# Patient Record
Sex: Female | Born: 1945
Health system: Southern US, Community
[De-identification: ages and names within clinical notes are randomized; demographics above are authoritative.]

## PROBLEM LIST (undated history)

## (undated) ENCOUNTER — Inpatient Hospital Stay (HOSPITAL_COMMUNITY): Payer: Self-pay

## (undated) ENCOUNTER — Emergency Department (HOSPITAL_COMMUNITY): Admission: EM | Discharge status: Absent without leave | Payer: Commercial Managed Care - HMO

## (undated) DIAGNOSIS — K5730 Diverticulosis of large intestine without perforation or abscess without bleeding: Secondary | ICD-10-CM

## (undated) DIAGNOSIS — I493 Ventricular premature depolarization: Secondary | ICD-10-CM

## (undated) DIAGNOSIS — I10 Essential (primary) hypertension: Secondary | ICD-10-CM

## (undated) DIAGNOSIS — T4145XA Adverse effect of unspecified anesthetic, initial encounter: Secondary | ICD-10-CM

## (undated) DIAGNOSIS — E669 Obesity, unspecified: Secondary | ICD-10-CM

## (undated) DIAGNOSIS — J189 Pneumonia, unspecified organism: Secondary | ICD-10-CM

## (undated) DIAGNOSIS — I351 Nonrheumatic aortic (valve) insufficiency: Secondary | ICD-10-CM

## (undated) DIAGNOSIS — K219 Gastro-esophageal reflux disease without esophagitis: Secondary | ICD-10-CM

## (undated) DIAGNOSIS — I491 Atrial premature depolarization: Secondary | ICD-10-CM

## (undated) DIAGNOSIS — J45909 Unspecified asthma, uncomplicated: Secondary | ICD-10-CM

## (undated) DIAGNOSIS — Z8719 Personal history of other diseases of the digestive system: Secondary | ICD-10-CM

## (undated) DIAGNOSIS — M1990 Unspecified osteoarthritis, unspecified site: Secondary | ICD-10-CM

## (undated) DIAGNOSIS — N390 Urinary tract infection, site not specified: Secondary | ICD-10-CM

## (undated) DIAGNOSIS — I517 Cardiomegaly: Secondary | ICD-10-CM

## (undated) DIAGNOSIS — I499 Cardiac arrhythmia, unspecified: Secondary | ICD-10-CM

## (undated) DIAGNOSIS — E7800 Pure hypercholesterolemia, unspecified: Secondary | ICD-10-CM

## (undated) DIAGNOSIS — I639 Cerebral infarction, unspecified: Secondary | ICD-10-CM

## (undated) DIAGNOSIS — E119 Type 2 diabetes mellitus without complications: Secondary | ICD-10-CM

## (undated) DIAGNOSIS — J42 Unspecified chronic bronchitis: Secondary | ICD-10-CM

## (undated) DIAGNOSIS — R569 Unspecified convulsions: Secondary | ICD-10-CM

## (undated) HISTORY — DX: Obesity, unspecified: E66.9

## (undated) HISTORY — DX: Nonrheumatic aortic (valve) insufficiency: I35.1

## (undated) HISTORY — PX: TONSILLECTOMY AND ADENOIDECTOMY: SUR1326

## (undated) HISTORY — PX: BLADDER SUSPENSION: SHX72

## (undated) HISTORY — PX: ESOPHAGEAL DILATION: SHX303

## (undated) HISTORY — PX: COLECTOMY: SHX59

## (undated) HISTORY — PX: BIOPSY THYROID: PRO38

## (undated) HISTORY — PX: CARDIAC CATHETERIZATION: SHX172

## (undated) HISTORY — DX: Unspecified osteoarthritis, unspecified site: M19.90

## (undated) HISTORY — PX: HERNIA REPAIR: SHX51

## (undated) HISTORY — DX: Ventricular premature depolarization: I49.3

## (undated) HISTORY — DX: Pure hypercholesterolemia, unspecified: E78.00

## (undated) HISTORY — PX: CATARACT EXTRACTION W/ INTRAOCULAR LENS  IMPLANT, BILATERAL: SHX1307

## (undated) HISTORY — PX: APPENDECTOMY: SHX54

## (undated) HISTORY — PX: SHOULDER ARTHROSCOPY W/ ROTATOR CUFF REPAIR: SHX2400

## (undated) HISTORY — DX: Cardiomegaly: I51.7

## (undated) HISTORY — PX: COLOSTOMY: SHX63

## (undated) HISTORY — DX: Essential (primary) hypertension: I10

## (undated) HISTORY — DX: Atrial premature depolarization: I49.1

## (undated) HISTORY — PX: COLOSTOMY REVERSAL: SHX5782

## (undated) HISTORY — PX: CARPAL TUNNEL RELEASE: SHX101

## (undated) HISTORY — PX: ABDOMINAL HERNIA REPAIR: SHX539

---

## 1982-06-28 HISTORY — PX: CHOLECYSTECTOMY OPEN: SUR202

## 1997-06-28 DIAGNOSIS — T8859XA Other complications of anesthesia, initial encounter: Secondary | ICD-10-CM

## 1997-06-28 HISTORY — DX: Other complications of anesthesia, initial encounter: T88.59XA

## 1997-12-25 ENCOUNTER — Ambulatory Visit (HOSPITAL_COMMUNITY): Admission: RE | Admit: 1997-12-25 | Discharge: 1997-12-25 | Payer: Self-pay | Admitting: Surgery

## 1998-04-10 ENCOUNTER — Inpatient Hospital Stay (HOSPITAL_COMMUNITY): Admission: RE | Admit: 1998-04-10 | Discharge: 1998-04-21 | Payer: Self-pay | Admitting: Surgery

## 1998-04-20 ENCOUNTER — Encounter: Payer: Self-pay | Admitting: General Surgery

## 1998-05-29 ENCOUNTER — Ambulatory Visit (HOSPITAL_COMMUNITY): Admission: RE | Admit: 1998-05-29 | Discharge: 1998-05-29 | Payer: Self-pay | Admitting: Family Medicine

## 1998-08-26 ENCOUNTER — Inpatient Hospital Stay (HOSPITAL_COMMUNITY): Admission: EM | Admit: 1998-08-26 | Discharge: 1998-08-29 | Payer: Self-pay | Admitting: General Surgery

## 1998-08-27 ENCOUNTER — Encounter: Payer: Self-pay | Admitting: General Surgery

## 1999-04-14 ENCOUNTER — Encounter: Admission: RE | Admit: 1999-04-14 | Discharge: 1999-04-14 | Payer: Self-pay | Admitting: Family Medicine

## 1999-04-14 ENCOUNTER — Encounter: Payer: Self-pay | Admitting: Family Medicine

## 1999-05-08 ENCOUNTER — Encounter: Payer: Self-pay | Admitting: General Surgery

## 1999-05-08 ENCOUNTER — Encounter: Admission: RE | Admit: 1999-05-08 | Discharge: 1999-05-08 | Payer: Self-pay | Admitting: General Surgery

## 1999-06-02 ENCOUNTER — Encounter: Payer: Self-pay | Admitting: General Surgery

## 1999-06-04 ENCOUNTER — Inpatient Hospital Stay (HOSPITAL_COMMUNITY): Admission: RE | Admit: 1999-06-04 | Discharge: 1999-06-11 | Payer: Self-pay | Admitting: General Surgery

## 1999-06-06 ENCOUNTER — Encounter: Payer: Self-pay | Admitting: Surgery

## 1999-06-10 ENCOUNTER — Encounter: Payer: Self-pay | Admitting: General Surgery

## 1999-08-04 ENCOUNTER — Encounter: Admission: RE | Admit: 1999-08-04 | Discharge: 1999-08-04 | Payer: Self-pay | Admitting: General Surgery

## 1999-08-04 ENCOUNTER — Encounter: Payer: Self-pay | Admitting: General Surgery

## 1999-10-20 ENCOUNTER — Other Ambulatory Visit: Admission: RE | Admit: 1999-10-20 | Discharge: 1999-10-20 | Payer: Self-pay | Admitting: Geriatric Medicine

## 2000-02-17 ENCOUNTER — Other Ambulatory Visit: Admission: RE | Admit: 2000-02-17 | Discharge: 2000-02-17 | Payer: Self-pay | Admitting: Gastroenterology

## 2000-02-17 ENCOUNTER — Encounter (INDEPENDENT_AMBULATORY_CARE_PROVIDER_SITE_OTHER): Payer: Self-pay | Admitting: Specialist

## 2000-03-11 ENCOUNTER — Encounter: Admission: RE | Admit: 2000-03-11 | Discharge: 2000-03-11 | Payer: Self-pay | Admitting: General Surgery

## 2000-03-11 ENCOUNTER — Encounter: Payer: Self-pay | Admitting: General Surgery

## 2000-09-28 ENCOUNTER — Encounter: Payer: Self-pay | Admitting: Geriatric Medicine

## 2000-09-28 ENCOUNTER — Encounter: Admission: RE | Admit: 2000-09-28 | Discharge: 2000-09-28 | Payer: Self-pay | Admitting: Geriatric Medicine

## 2001-01-06 ENCOUNTER — Ambulatory Visit (HOSPITAL_COMMUNITY): Admission: RE | Admit: 2001-01-06 | Discharge: 2001-01-06 | Payer: Self-pay | Admitting: Gastroenterology

## 2001-02-14 ENCOUNTER — Encounter: Payer: Self-pay | Admitting: Geriatric Medicine

## 2001-02-14 ENCOUNTER — Encounter: Admission: RE | Admit: 2001-02-14 | Discharge: 2001-02-14 | Payer: Self-pay | Admitting: Geriatric Medicine

## 2001-04-25 ENCOUNTER — Ambulatory Visit (HOSPITAL_BASED_OUTPATIENT_CLINIC_OR_DEPARTMENT_OTHER): Admission: RE | Admit: 2001-04-25 | Discharge: 2001-04-25 | Payer: Self-pay | Admitting: Orthopedic Surgery

## 2001-07-19 ENCOUNTER — Encounter: Admission: RE | Admit: 2001-07-19 | Discharge: 2001-09-14 | Payer: Self-pay | Admitting: Geriatric Medicine

## 2001-11-08 ENCOUNTER — Other Ambulatory Visit: Admission: RE | Admit: 2001-11-08 | Discharge: 2001-11-08 | Payer: Self-pay | Admitting: Geriatric Medicine

## 2002-05-01 ENCOUNTER — Ambulatory Visit (HOSPITAL_BASED_OUTPATIENT_CLINIC_OR_DEPARTMENT_OTHER): Admission: RE | Admit: 2002-05-01 | Discharge: 2002-05-01 | Payer: Self-pay | Admitting: Orthopedic Surgery

## 2002-06-28 HISTORY — PX: TOTAL ABDOMINAL HYSTERECTOMY: SHX209

## 2003-01-18 ENCOUNTER — Encounter: Payer: Self-pay | Admitting: Geriatric Medicine

## 2003-01-18 ENCOUNTER — Encounter: Admission: RE | Admit: 2003-01-18 | Discharge: 2003-01-18 | Payer: Self-pay | Admitting: Geriatric Medicine

## 2003-02-01 ENCOUNTER — Other Ambulatory Visit: Admission: RE | Admit: 2003-02-01 | Discharge: 2003-02-01 | Payer: Self-pay | Admitting: Obstetrics and Gynecology

## 2003-02-21 ENCOUNTER — Encounter: Payer: Self-pay | Admitting: Obstetrics and Gynecology

## 2003-02-21 ENCOUNTER — Ambulatory Visit (HOSPITAL_COMMUNITY): Admission: RE | Admit: 2003-02-21 | Discharge: 2003-02-21 | Payer: Self-pay | Admitting: Obstetrics and Gynecology

## 2003-03-05 ENCOUNTER — Ambulatory Visit: Admission: RE | Admit: 2003-03-05 | Discharge: 2003-03-05 | Payer: Self-pay | Admitting: Gynecology

## 2003-04-12 ENCOUNTER — Encounter: Payer: Self-pay | Admitting: Gynecology

## 2003-04-16 ENCOUNTER — Encounter (INDEPENDENT_AMBULATORY_CARE_PROVIDER_SITE_OTHER): Payer: Self-pay

## 2003-04-16 ENCOUNTER — Encounter: Payer: Self-pay | Admitting: Anesthesiology

## 2003-04-16 ENCOUNTER — Inpatient Hospital Stay (HOSPITAL_COMMUNITY): Admission: RE | Admit: 2003-04-16 | Discharge: 2003-04-21 | Payer: Self-pay | Admitting: Gynecology

## 2003-04-18 ENCOUNTER — Encounter: Payer: Self-pay | Admitting: Obstetrics and Gynecology

## 2003-05-22 ENCOUNTER — Inpatient Hospital Stay (HOSPITAL_COMMUNITY): Admission: AD | Admit: 2003-05-22 | Discharge: 2003-05-22 | Payer: Self-pay | Admitting: Obstetrics and Gynecology

## 2003-06-01 ENCOUNTER — Emergency Department (HOSPITAL_COMMUNITY): Admission: EM | Admit: 2003-06-01 | Discharge: 2003-06-02 | Payer: Self-pay | Admitting: Emergency Medicine

## 2003-09-24 ENCOUNTER — Encounter: Admission: RE | Admit: 2003-09-24 | Discharge: 2003-09-24 | Payer: Self-pay | Admitting: Geriatric Medicine

## 2005-03-03 ENCOUNTER — Encounter: Admission: RE | Admit: 2005-03-03 | Discharge: 2005-03-03 | Payer: Self-pay | Admitting: Geriatric Medicine

## 2005-05-26 ENCOUNTER — Encounter: Admission: RE | Admit: 2005-05-26 | Discharge: 2005-05-26 | Payer: Self-pay | Admitting: Orthopedic Surgery

## 2005-06-30 ENCOUNTER — Ambulatory Visit: Payer: Self-pay

## 2005-10-27 ENCOUNTER — Encounter: Payer: Self-pay | Admitting: Surgery

## 2006-07-27 ENCOUNTER — Encounter: Admission: RE | Admit: 2006-07-27 | Discharge: 2006-07-27 | Payer: Self-pay | Admitting: Geriatric Medicine

## 2006-12-02 ENCOUNTER — Emergency Department (HOSPITAL_COMMUNITY): Admission: EM | Admit: 2006-12-02 | Discharge: 2006-12-02 | Payer: Self-pay | Admitting: Emergency Medicine

## 2006-12-03 ENCOUNTER — Ambulatory Visit (HOSPITAL_COMMUNITY): Admission: RE | Admit: 2006-12-03 | Discharge: 2006-12-03 | Payer: Self-pay | Admitting: Emergency Medicine

## 2006-12-03 ENCOUNTER — Ambulatory Visit: Payer: Self-pay | Admitting: Vascular Surgery

## 2007-04-26 ENCOUNTER — Ambulatory Visit (HOSPITAL_BASED_OUTPATIENT_CLINIC_OR_DEPARTMENT_OTHER): Admission: RE | Admit: 2007-04-26 | Discharge: 2007-04-27 | Payer: Self-pay | Admitting: Urology

## 2007-05-06 ENCOUNTER — Ambulatory Visit: Payer: Self-pay | Admitting: Internal Medicine

## 2007-05-06 ENCOUNTER — Inpatient Hospital Stay (HOSPITAL_COMMUNITY): Admission: EM | Admit: 2007-05-06 | Discharge: 2007-05-08 | Payer: Self-pay | Admitting: Emergency Medicine

## 2007-05-08 ENCOUNTER — Encounter: Payer: Self-pay | Admitting: Interventional Cardiology

## 2008-08-18 ENCOUNTER — Encounter: Admission: RE | Admit: 2008-08-18 | Discharge: 2008-08-18 | Payer: Self-pay | Admitting: Geriatric Medicine

## 2008-09-10 ENCOUNTER — Encounter: Admission: RE | Admit: 2008-09-10 | Discharge: 2008-10-10 | Payer: Self-pay | Admitting: Geriatric Medicine

## 2009-10-26 HISTORY — PX: TRIGGER FINGER RELEASE: SHX641

## 2009-10-29 ENCOUNTER — Ambulatory Visit (HOSPITAL_BASED_OUTPATIENT_CLINIC_OR_DEPARTMENT_OTHER): Admission: RE | Admit: 2009-10-29 | Discharge: 2009-10-29 | Payer: Self-pay | Admitting: Orthopedic Surgery

## 2010-02-03 ENCOUNTER — Inpatient Hospital Stay (HOSPITAL_BASED_OUTPATIENT_CLINIC_OR_DEPARTMENT_OTHER): Admission: RE | Admit: 2010-02-03 | Discharge: 2010-02-03 | Payer: Self-pay | Admitting: Interventional Cardiology

## 2010-08-28 ENCOUNTER — Other Ambulatory Visit: Payer: Self-pay

## 2010-09-15 LAB — BASIC METABOLIC PANEL
BUN: 16 mg/dL (ref 6–23)
CO2: 32 mEq/L (ref 19–32)
Calcium: 9 mg/dL (ref 8.4–10.5)
Chloride: 103 mEq/L (ref 96–112)
Creatinine, Ser: 0.61 mg/dL (ref 0.4–1.2)
GFR calc Af Amer: >60 mL/min (ref >60)
GFR calc non Af Amer: >60 mL/min (ref >60)
Glucose, Bld: 118 mg/dL — ABNORMAL HIGH (ref 70–99)
Potassium: 4.8 mEq/L (ref 3.5–5.1)
Sodium: 139 mEq/L (ref 135–145)

## 2010-09-15 LAB — POCT HEMOGLOBIN-HEMACUE: Hemoglobin: 10.3 g/dL — ABNORMAL LOW (ref 12.0–15.0)

## 2010-09-15 LAB — GLUCOSE, CAPILLARY
Glucose-Capillary: 125 mg/dL — ABNORMAL HIGH (ref 70–99)
Glucose-Capillary: 132 mg/dL — ABNORMAL HIGH (ref 70–99)

## 2010-09-28 ENCOUNTER — Other Ambulatory Visit: Payer: Self-pay

## 2010-11-10 NOTE — Op Note (Signed)
NAME:  Kathleen Oliver, Kathleen Oliver NO.:  [id number]   MEDICAL RECORD NO.:  [id number]          PATIENT TYPE:  AMB   LOCATION:  [facility]                         FACILITY:  [facility]   PHYSICIAN:  Valetta Fuller, M.D.  DATE OF BIRTH:  04/23/1946   DATE OF PROCEDURE:  04/26/2007  DATE OF DISCHARGE:                               OPERATIVE REPORT   PREOPERATIVE DIAGNOSIS:  Stress urinary incontinence.   POSTOPERATIVE DIAGNOSIS:  Stress urinary incontinence.   PROCEDURE PERFORMED:  Transobturator suburethral sling.   SURGEON:  Valetta Fuller, M.D.   ANESTHESIA:  General.   INDICATIONS:  [name] is a 65 year old female.  She has had very  longstanding problems with urinary incontinence and voiding dysfunction.  She has had clear-cut stress incontinence by history with confirmation  on video urodynamics.  She also had some voiding dysfunction with a  reduced bladder capacity, hypersensitivity and at least some mild  instability.  She has been on anticholinergic medication as well as  physical therapy to try to improve her irritative/overactive symptoms.  She has persisted in having significant stress incontinence and elected  to have suburethral sling.  She had recent ventral hernia repair with  mesh and therefore we want to avoid the retropubic approach if possible  and I suggested transobturator.  She appeared to understand the  advantages, disadvantages, success rates, potential complications and  issues with regards to suburethral sling surgery.  She elected to  proceed and presents now for the procedure.   PROCEDURE IN DETAIL:  The patient was brought to the operating room  where she had successful induction of general anesthesia.  She was  placed in the mid lithotomy position and prepped and draped in the usual  manner.  A weighted vaginal speculum was utilized.  The patient had  moderate vaginal stenosis and moderate to significant vaginal atrophy.  There did not appear to  be significant prolapse at least anteriorly.  Foley catheter was inserted and the bladder completely drained.  Anterior vaginal mucosa over the mid urethra was infiltrated.  A small  midline incision was made.  We then were able to dissect off the vaginal  planes until we were able to at least palpate the obturator fossa region  bilaterally.   Two stab incisions were made approximately 5 cm lateral to the clitoris  on the right and left side.  The curved obturator needles were then used  with direct digital finger control to come out the area of the mid  urethra.  The sling itself was then positioned at the mid urethra  utilizing a right angle clamp.  The sheath was removed in the standard  manner and the right angle clamp was used to assure proper tensioning.  Redundant sling was then cut at the level of the obturator incisions.  The whole vaginal area was copiously irrigated.  Cystoscopy was  performed.  There was no evidence of any other bladder pathology,  evidence of any urethral or bladder injury.  Vaginal mucosa was closed  with 2-0 Vicryl suture.  Vaginal packing with Estrace cream was utilized  and a Foley catheter was left indwelling draining clear urine.  There  were no obvious complications or problems and the patient was brought to  the recovery room in stable condition.           ______________________________  Valetta Fuller, M.D.  Electronically Signed     DSG/MEDQ  D:  04/26/2007  T:  04/26/2007  Job:  [id number]

## 2010-11-10 NOTE — H&P (Signed)
NAME:  Kathleen Oliver NO.:  [id number]   MEDICAL RECORD NO.:  [id number]          PATIENT TYPE:  INP   LOCATION:  1419                         FACILITY:  [facility]   PHYSICIAN:  Michelene Gardener, MD    DATE OF BIRTH:  06-22-1946   DATE OF ADMISSION:  05/06/2007  DATE OF DISCHARGE:                              HISTORY & PHYSICAL   CHIEF COMPLAINT:  Chest pain for several hours today status post bladder  sling October 29th per Dr. Isabel Caprice.   HISTORY OF PRESENT ILLNESS:  Kathleen Oliver is a 65 year old white female here  with chest discomfort anterior to left chest that seemed to start about  2:30 p.m., off and on for 2 to 3 hours, just would not go away even  though she tried to burp.  It was dull with nausea, sweating, radiated  to the left arm and she became concerned, called the daughter and came  to the ER.  Sublingual nitroglycerin seemed to help in the ER and she is  now pain-free.   PAST MEDICAL HISTORY:  Illnesses:  1. History of TIA with seizure.  2. GERD.  3. Diabetes mellitus.  4. Diverticulosis.  5. Depression.  6. Peripheral neuropathy of left lateral thigh post one of her      abdominal surgeries.  7. Hypertension.  8. Glaucoma.   SURGERIES:  1. Status post bladder sling October, 2008.  2. Carpal tunnel syndrome.  3. Cholecystectomy.  4. Status post multiple laparotomies with anterior abdominal wall      mesh.  5. TAH/BSO.  6. Temporary colostomy in 1999 after ruptured diverticulosis and      partial colon resection.  7. Shoulder surgery.   FAMILY HISTORY:  1. Coronary artery disease.  2. Diabetes.   SOCIAL HISTORY:  1. No tobacco.  2. No alcohol.   DRUG ALLERGIES:  1. PENICILLIN.  2. CINOBAC.   MEDICATIONS:  1. Macrodantin 100 mg p.o. b.i.d. for 10 days.  2. Vicodin 1 to 2 q.6 hours p.r.n.  3. Aggrenox one p.o. b.i.d.  4. HCTZ 12.5 mg p.o. daily.  5. Lisinopril 10 mg p.o. daily.  6. Norvasc 2.5 mg p.o. daily.  7. Lipitor 10 mg p.o.  daily.  8. Metformin 500 mg p.o. daily.  9. Sanctura 60 mg one p.o. daily.   REVIEW OF SYSTEMS:  Noncontributory.   PHYSICAL EXAM:  She is afebrile, heart rate 72, respirations 20, blood  pressure 145/67, O2 saturation 98%.  ENT EXAM:  Sclera clear.  TMs clear.  Oropharynx benign.  NECK:  Without lymphadenopathy, JVD, thyromegaly.  CHEST:  No rales or wheezing.  CARDIAC EXAM:  Regular rate and rhythm.  No murmur, gallop or rub.  ABDOMEN:  Soft, mild low abdominal tenderness.  Positive bowel sounds.  EXTREMITIES:  Chronic right lower extremity trace edema.  SKIN:  No rash.  NEUROLOGICAL:  Nonfocal.   LABS:  CPK MB less than 1 and troponin I of less than 0.05 x2.  Hemoglobin 11.7, white blood cell count 9.6, D-dimer is slightly  elevated at 0.55 with the upper limits of normal  at 0.48.  Electrolytes  within normal limits.  Glucose 135, BUN and creatinine 12 and 0.8.  LFTs  within normal limits.  ECG:  Sinus rhythm, no acute changes.  Chest x-  ray:  No acute disease.   ASSESSMENT AND PLAN:  Chest pain, questionable cardiac versus  gastroenterologic versus pulmonary embolism versus other.  She is to be  admitted.  Plan of action is to apply telemetry,  rule out myocardial  infarction  with serial cardiac enzymes.  Check CT angiogram of the chest to rule  out pulmonary embolism.  Given her recent surgery and history of  transient ischemic attack, currently on Aggrenox, consider Cardiology  consultation prior to discharge if CT is negative.      Corwin Levins, MD      Michelene Gardener, MD  Electronically Signed    JWJ/MEDQ  D:  05/06/2007  T:  05/07/2007  Job:  [id number]   cc:   Hal T. Stoneking, M.D.  Fax: [phone]   Corky Crafts, MD  Fax: [phone]   Valetta Fuller, M.D.  Fax: [phone]

## 2010-11-13 NOTE — Discharge Summary (Signed)
NAME:  Kathleen Oliver, Kathleen Oliver NO.:  [id number]   MEDICAL RECORD NO.:  [id number]                   PATIENT TYPE:  INP   LOCATION:  0443                                 FACILITY:  [facility]   PHYSICIAN:  Randye Lobo, M.D.                DATE OF BIRTH:  1946/04/25   DATE OF ADMISSION:  04/16/2003  DATE OF DISCHARGE:  04/21/2003                                 DISCHARGE SUMMARY   ADMISSION DIAGNOSES:  1. Postmenopausal bleeding.  2. Cervical stenosis.  3. Thickened endometrial stripe on ultrasound.  4. Status post multiple laparotomies with an anterior abdominal wall mesh     placement.   DISCHARGE DIAGNOSES:  1. Status post exploratory laparotomy with total abdominal hysterectomy,     bilateral salpingo-oophorectomy, and enterolysis.  2. Benign endometrial polyp.  3. Simple endometrial hyperplasia.   HISTORY AND PHYSICAL EXAMINATION:  The patient is a 65 year old gravida 1,  para 1, Caucasian female with postmenopausal bleeding of several months  duration who was status post attempted ultrasound guided D&C on February 21, 2003, which was unsuccessful due to cervical stenosis.  The patient  underwent premature menopause in her 30s, she did report a history of DES  exposure.  A pelvic ultrasound on January 18, 2003, documented a thickened  endometrial stripe of 11 mm.  No adnexal masses were appreciated on  ultrasound.   The patient's surgical history had been remarkable for several prior  laparotomies, including a colon resection with colostomy, a colostomy  revision, and ultimately a colostomy reversal, and then two ventral  herniorrhaphies, this last of which involved placement of an anterior  abdominal wall mesh at the end of the year 2000.   PHYSICAL EXAMINATION:  ABDOMEN:  A midline vertical incision with also a  right upper quadrant oblique incision.  There was no evidence of any  herniation present.  There were no abdominal masses.  PELVIC:  Normal  external genitalia and urethra.  The vagina demonstrated no  lesions.  The cervix was noted to be flush with the vagina, and there  appeared to be a dimple in the right vaginal apex which was thought to be  consistent with the cervical os.  The uterus was small and nontender, and no  adnexal masses were appreciated.   The patient underwent consultation by Dr. De Blanch based on the  failed ultrasound guided D&C procedure to sample the endometrium, and he did  recommend exploratory laparotomy based on suspicion for potential  endometrial neoplasia, and the patient was therefore admitted on April 16, 2003, at which time she underwent an exploratory laparotomy with total  abdominal hysterectomy, bilateral salpingo-oophorectomy, and enterolysis  with Drs. De Blanch and Dr. Conley Simmonds as co-surgeons.  Findings at the time of surgery were an anterior abdominal wall mesh which  was traversed in order to gain entry into the peritoneal cavity.  There were  multiple adhesions and small bowel to the anterior abdominal wall which were  lysed during the surgery.  The patient appeared to have a normal uterus,  tubes, and ovaries.  The specimen was sent to pathology, and the frozen  section report did document cervical stenosis as well as simple endometrial  hyperplasia and a benign endometrial polyp.  The patient's surgery was  uncomplicated.   Postoperatively, the patient had a slow return to bowel function.  Initially, her pain was controlled with Dilaudid PCA and Toradol.  Eventually, she had her diet slowly advanced.  She began demonstrating  return to bowel function, and she was converted to oral pain medication,  including Percocet and ibuprofen.   The patient was documented to have a fever to 102.1 degrees Fahrenheit on  April 18, 2003.  Her white count was measured at 8.1.  Her urinalysis was  negative.  A chest x-ray demonstrated bibasilar atelectasis with no  evidence  of an acute infiltrate or consolidation.  The patient increased her  ambulation and use of the incentive spirometer, and the fever defervesced.  Final blood culture results were pending at the time of her discharge, and  ultimately were negative for bacteremia.   The patient's discharge hematocrit was 28.7%, and she was tolerating this  well.   The patient's incisions remained clean, dry, and intact without evidence of  any erythema or drainage.   The patient was found to be in good condition and ready for discharge on  postoperative day #5.   DISCHARGE INSTRUCTIONS:  1. Discharged to home.  2. Prescriptions for Percocet one to two p.o. q.4h. p.r.n. pain.  The     patient will resume her other usual medications and her usual dosages.  3. The patient will resume a regular diet.  4. The patient will diminish her activity.  She will not drive for two     weeks.  She will not lift anything greater than 10 pounds for another six     weeks.  5. The patient will follow up in the office in three days for staple     removal.  She will call sooner if she experiences any problem with fever,     nausea, vomiting, redness or drainage around the incision, pain     uncontrolled by her medication, or any other concern.                                               Randye Lobo, M.D.    BES/MEDQ  D:  05/14/2003  T:  05/14/2003  Job:  [id number]

## 2010-11-13 NOTE — Consult Note (Signed)
NAME:  Kathleen Oliver, GUNNER NO.:  [id number]   MEDICAL RECORD NO.:  [id number]                   PATIENT TYPE:  OUT   LOCATION:  GYN                                  FACILITY:  [facility]   PHYSICIAN:  De Blanch, M.D.         DATE OF BIRTH:  04-09-1946   DATE OF CONSULTATION:  03/05/2003  DATE OF DISCHARGE:                                   CONSULTATION   GYN/ONCOLOGY PROGRAM/NEW PATIENT CONSULTATION   A 65 year old white female seen in consultation at the request of Dr. Conley Simmonds, regarding postmenopausal bleeding and cervical stenosis and a  thickened endometrial stripe.   Essentially, the patient noted some pelvic pressure and underwent evaluation  with an ultrasound which showed an 11 mm endometrial stripe.  The  endometrial biopsies were attempted but secondary to cervical stenosis were  unsuccessful.  Subsequently, the patient was taken to the operating room  where she underwent attempted D&C using ultrasound guidance.  Once again,  the endometrial cavity was unable to be entered.  The patient has had no  complications associated with this.   PAST MEDICAL HISTORY:   MEDICAL ILLNESSES:  1. The patient apparently has had a complicated series of surgical     procedures associated with ruptured diverticulitis.  During the course of     recovery from what sounds like sepsis, she had a seizure in 1999.  She     has had none since then.  2. Hypertension.  3. Glaucoma.  4. GERD.   PAST SURGICAL HISTORY:  1. In 1999, the patient had a ruptured colon, undergoing colostomy and     resection.  Colostomy was reversed subsequently, but according to the     patient's history, she had a leak and required a second operation.     Subsequent to that, she has had ventral hernias repaired on two     occasions, the last being in the year 2000, where a mesh was inserted.  2. Cholecystectomy in 1985.  3. Carpal tunnel release in 2002.  4. The patient  has had sigmoidoscopy approximately two weeks ago which was     negative.   DRUG ALLERGIES:  1. PENICILLIN.  2. CINOBAC (an antibiotic).   LAST MENSTRUAL PERIOD:  The patient went through menopause in her early 83s.  She does have one living child.   CURRENT MEDICATIONS:  Hydrochlorothiazide, Pepcid, Timoptic, and aspirin.   REVIEW OF SYSTEMS:  Essentially negative except for pelvic pressure.  She  has occasional vaginal spotting.   PHYSICAL EXAMINATION:  GENERAL:  The patient is a healthy, pleasant, obese  white female in no acute distress.  VITAL SIGNS:  Height 5 foot 2.  Weight 205 pounds.  Blood pressure 158/90,  pulse 76, respiratory rate 18.  HEENT:  Negative.  NECK:  Supple without thyromegaly.  There is no supraclavicular or inguinal  adenopathy.  ABDOMEN:  Obese, soft, nontender.  No  mass, organomegaly, ascites, or  hernias are noted.  Her midline incision is well-healed.  PELVIC:  EGBUS, vagina, bladder, and urethra are normal.  There is a dimple  at the right apex of what appears to be the vagina.  I am unable to sound  this with a wire probe, although I suspect it is some remnant of the cervix.  Bimanual exam reveals what I believe is a small uterus which is mobile, and  no adnexal masses noted.  Rectovaginal examination confirms.   IMPRESSION:  A thickened endometrial stripe in a patient with cervical  stenosis.  We are unable to obtain an endometrial sample.  I believe that  she is at high enough risk to have endometrial cancer, that further  pathologic evaluation is necessary rather than observation.  Therefore, I  would recommend that the patient undergo a total abdominal hysterectomy and  bilateral salpingo-oophorectomy with intraoperative frozen section and  surgical staging if necessary.  Obviously, the risks of surgery are  increased given the fact she has had peritonitis and two prior colon  procedures.  Obviously, we would anticipate extensive adhesions.   In  addition, the patient has mesh in her anterior abdominal wall which I  believe could be closed without difficulty, although we will contact Dr.  Abbey Chatters for his advice and consultation before planning surgery.  I would  be happy to operate with Dr. Edward Jolly, and we will arrange and coordinate  surgery thereafter.  The patient is in agreement with this plan and  understands the risks of surgery in her situation.                                                De Blanch, M.D.    DC/MEDQ  D:  03/05/2003  T:  03/05/2003  Job:  [id number]   cc:   Randye Lobo, M.D.  [address]  [place]  [place]  [address]  Fax: [phone]   Adolph Pollack, M.D.  [address]  [place]  [place] [address]  Fax: [phone]   Telford Nab, R.N.  [phone]  [place], [place] [address]

## 2010-11-13 NOTE — Op Note (Signed)
[place]  Patient:    Kathleen Oliver, Kathleen Oliver Visit Number: [id number] MRN: [id number]          Service Type: DSU Location: [facility] Attending Physician:  Susa Day Dictated by:   Katy Fitch Naaman Plummer., M.D. Proc. Date: 04/25/01 Admit Date:  04/25/2001   CC:         Hal T. Stoneking, M.D.                           Operative Report  PREOPERATIVE DIAGNOSIS:  Chronic entrapment neuropathy, median nerve, right carpal tunnel.  POSTOPERATIVE DIAGNOSIS:  Chronic entrapment neuropathy, median nerve, right carpal tunnel.  OPERATION PERFORMED:  Release of right transverse carpal ligament.  OPERATING SURGEON:  Josephine Igo, M.D.  ANESTHESIA:  General by mask.  SUPERVISING ANESTHESIOLOGIST:  Dr. Gypsy Balsam.  INDICATIONS:  [name] is a 65 year old woman referred by Dr. Merlene Laughter for evaluation and management of hand pain and numbness.  Dr. Pete Glatter had established a diagnosis of carpal tunnel syndrome and confirmed this with electrodiagnostic studies completed in August at Dr. Clarisa Kindred office.  This revealed evidence of significant bilateral carpal tunnel syndrome.  After informed consent, [name] is brought to the operating room at this time for release of her right transverse carpal ligament.  DESCRIPTION OF PROCEDURE:  [name] was brought to the operating room and placed in supine position on the operating table.  Following induction of general anesthesia by mask, the right arm was prepped with Betadine soap and solution and sterilely draped.  When anesthesia was satisfactory, the procedure commenced exsanguination of the limb with Esmarch bandage and inflation of an arterial tourniquet on the proximal brachium to 250 mmHg.  A 1.5 cm incision was fashioned in the palm in line of the ring finger.  The subcutaneous tissues were carefully divided revealing the palmar fascia.  This was split longitudinally to reveal the common  sensory branch of the median nerve.  These were followed back to the transverse carpal ligament which was carefully separated from the median nerve.  The ligament was released on its ulnar border extending into the distal forearm.  This widely opened the carpal canal.  No masses or other predicaments were noted.  Bleeding points along the margins of the released ligament were electrocauterized with bipolar current followed by repair of the skin with intradermal 3-0 Prolene suture.  1% lidocaine and 0.25% Marcaine was infiltrated along the wound margins for postoperative analgesia.  The wound was then dressed with Steri-Strips followed by sterile gauze, sterile Webril and a volar plaster splint maintaining the wrist in 10 degrees dorsiflexion.  There were no apparent complications.  For aftercare [name] is given a prescription for Percocet 5 mg one or two tablets p.o. q.4-6h. p.r.n. pain.  A total of 20 tablets without refill.   She will return to our office for follow-up in a week to 10 days. Dictated by:   Katy Fitch Naaman Plummer., M.D. Attending Physician:  Susa Day DD:  04/25/01 TD:  04/25/01 Job: [id number]

## 2010-11-13 NOTE — Discharge Summary (Signed)
NAMEMarland Kitchen  Kathleen Oliver, Kathleen Oliver NO.:  [id number]   MEDICAL RECORD NO.:  [id number]          PATIENT TYPE:  INP   LOCATION:  1419                         FACILITY:  [facility]   PHYSICIAN:  Michelene Gardener, MD    DATE OF BIRTH:  1945/10/22   DATE OF ADMISSION:  05/06/2007  DATE OF DISCHARGE:  05/08/2007                               DISCHARGE SUMMARY   PRIMARY CARE PHYSICIAN:  Randye Lobo, M.D.   DISCHARGE DIAGNOSES:  1. Chest pain, most likely secondary to gastroesophageal reflux      disease and musculoskeletal causes.  2. Carpal tunnel syndrome.  3. History of transient ischemic attack.  4. Gastroesophageal reflux disease.  5. Diabetes mellitus.  6. Diverticulosis.  7. Depression.  8. Peripheral neuropathy.  9. Hypertension.  10.Glaucoma.   DISCHARGE MEDICATIONS:  1. Macrodantin 100 mg p.o. twice daily for 2 days.  2. Vicodin 1-2 tablets q.6h. as needed.  3. Aggrenox 1 tablet p.o. twice daily.  4. Hydrochlorothiazide 12.5 mg p.o. once daily.  5. Lisinopril 10 mg p.o. once daily.  6. Norvasc 2.5 mg p.o. once daily.  7. Lipitor 10 mg p.o. once daily.  8. Metformin 500 mg p.o. once daily.  9. Sanctura 60 mg p.o. once daily.   CONSULTATIONS:  Cardiology consult.   PROCEDURES:  Cardiolite stress test came to be negative.   HOSPITAL COURSE:  This is a 65 year old female with a past medical  history of hypertension, diabetes, and hyperlipidemia who presented to  the hospital complaining of increasing chest pain.  The patient has  multiple risk factors for heart problems, including hypertension,  hyperlipidemia, diabetes, and obesity.  Admitted to telemetry.  Three  sets of troponins were done, and they came to be normal.  EKG showed no  evidence of acute ischemia.  CT scan of the chest was done, and it came  to be negative for PE.  Cardiology was consulted, and the patient was  taken  for a stress test that came to be normal.  This patient has severe  underlying  GERD, and her current symptoms were attributed to GERD.  The  patient was continued on the same medications as taken prior to  admission, and she was advised to follow up with her primary physician  as an outpatient.      Michelene Gardener, MD  Electronically Signed     NAE/MEDQ  D:  05/19/2007  T:  05/20/2007  Job:  [id number]   cc:   Randye Lobo, M.D.  Fax: [phone]

## 2010-11-13 NOTE — Op Note (Signed)
NAME:  Kathleen Oliver, Kathleen Oliver NO.:  [id number]   MEDICAL RECORD NO.:  [id number]                   PATIENT TYPE:  INP   LOCATION:  X005                                 FACILITY:  [facility]   PHYSICIAN:  De Blanch, M.D.         DATE OF BIRTH:  03-25-1946   DATE OF PROCEDURE:  04/16/2003  DATE OF DISCHARGE:                                 OPERATIVE REPORT   PREOPERATIVE DIAGNOSES:  Cervical stenosis with thickened endometrial stripe  status post multiple laparotomies with anterior abdominal wall mesh  placement.   POSTOPERATIVE DIAGNOSES:  1. Endometrial polyp.  2. Simple hyperplasia.   PROCEDURE:  1. Exploratory laparotomy.  2. Lysis of intestinal adhesions.  3. Total abdominal hysterectomy.  4. Bilateral salpingo-oophorectomy.   COSURGEONS:  Randye Lobo, M.D., De Blanch, M.D.   ASSISTANT:  Telford Nab, R.N.   ANESTHESIA:  General with orotracheal tube.   ESTIMATED BLOOD LOSS:  200 mL.   SURGICAL FINDINGS:  At exploratory laparotomy patient had a mesh covering  the prior midline incision which was traversed.  There were multiple small  bowel adhesions to the anterior abdominal wall.  Tubes and ovaries were  adherent to the pelvic side walls.  Uterus is normal size and the tubes and  ovaries appeared otherwise normal.  On frozen section pathologist indicated  the patient had cervical stenosis with simple endometrial hyperplasia and an  endometrial polyp.   PROCEDURE:  The patient was brought to the operating room and after  satisfactory attainment of general anesthesia was placed in a modified  lithotomy position ____________.  The anterior abdominal wall, perineum, and  vagina were prepped with Betadine.  A Foley catheter was placed.  The  patient was draped.  The abdomen was entered through a prior midline  incision.  Adhesions were identified immediately and using sharp dissection  with Metzenbaum scissors small  bowel was freed from its attachments to the  parietoperitoneum of the anterior abdominal wall.  The pelvis itself was  relatively spared from adhesions, although the tubes and ovaries were  adherent to the pelvic side wall.  Once exposure was achieved in the pelvis,  Bookwalter retractor was positioned and the bowel was packed out of the  pelvis.  The uterus was grasped with long Kelly clamps and the round  ligaments were divided.  The retroperitoneal spaces were opened identifying  the vessels and ureter.  The ovarian vessels were skeletonized, clamped,  cut, free tied, and suture ligated using 2-0 Vicryl suture.  The bladder  flap was advanced to sharp dissection.  The uterine vessels were  skeletonized, clamped, cut, and suture ligated.  In a stepwise fashion  paracervical and cardinal ligaments were clamped, cut, and suture ligated.  The upper vagina was palpated, cross clamped, and divided.  Uterus, cervix,  tubes, and ovaries were handed off the operative field and submitted for  frozen section with the above noted findings.  The vaginal angles were  transfixed with 0 Vicryl and the central portion of the vagina closed with  interrupted figure-of-eight sutures of 0 Vicryl.  Pelvis was irrigated and  hemostasis achieved with some additional cautery.  Packs and retractors  removed.  The anterior abdominal wall was then closed in layers.  The first  layer was interrupted figure-of-eight sutures of #1 Prolene incorporating  the previously placed permanent mesh.  In order to achieve closure over the  Prolene suture knots, the subcutaneous tissue was mobilized laterally and an  area of thin skin and subcutaneous tissue in the upper portion of the wound  was excised.  The subcutaneous tissue was then reapproximated over the  sutures using 3-0 Vicryl.  The skin was closed with skin staples and  dressing was applied.  The patient was awakened from anesthesia, taken to  the recovery room in  satisfactory condition.  Sponge, needle, and instrument  counts correct x2.                                               De Blanch, M.D.    DC/MEDQ  D:  04/16/2003  T:  04/16/2003  Job:  [id number]   cc:   Randye Lobo, M.D.  [address]  [place]  [place]  [address]  Fax: [phone]   Telford Nab, R.N.  [address]  [place], [place] [address]

## 2010-11-13 NOTE — H&P (Signed)
NAME:  Kathleen Oliver, Kathleen Oliver NO.:  [id number]   MEDICAL RECORD NO.:  [id number]                   PATIENT TYPE:  INP   LOCATION:  NA                                   FACILITY:  [facility]   PHYSICIAN:  Randye Lobo, M.D.                DATE OF BIRTH:  [birth year]   DATE OF ADMISSION:  DATE OF DISCHARGE:                                HISTORY & PHYSICAL   NOTE:  This patient is scheduled for surgery on April 16, 2003 at 11  o'clock at [facility].   CHIEF COMPLAINT:  Postmenopausal bleeding.   HISTORY OF PRESENT ILLNESS:  The patient is a 65 year old gravida 1, para 1  Caucasian female with postmenopausal bleeding of several months duration who  is status post attempted ultrasound-guided D&C on February 21, 2003, which was  unsuccessful.  The patient reports that she underwent premature menopause in  her 30s.  The patient does have a history of DES exposure. A pelvic  ultrasound, which was performed on January 18, 2003 under the direction of her  primary care Kathleen Oliver documented a uterus measuring 5.0 x 2.4 x 3.6 cm and  with an endometrial stripe, which measured 11 mm in thickness.  The right  ovary was not visualized and the left ovary was within normal limits.  No  free fluid was appreciated.  The patient now presents for further evaluation  of her postmenopausal bleeding.   PAST OBSTETRICAL AND GYNECOLOGICAL HISTORY:  Remarkable for one prior  vaginal delivery.  The patient's last Pap smear was performed on February 01, 2003 and was within normal limits.  The patient's last mammogram was  performed in April 2004 and apparently was within normal limits.  The  patient is not taking any hormone replacement therapy.   PAST MEDICAL HISTORY:  1. Ruptured diverticulosis.  2. Status post bronchitis in early October 2004.  The patient is status post     Z-Pak treatment.  The patient also uses an albuterol nebulizer p.r.n.  3. Depression.  The patient  stopped treatment with Lexapro one month ago.  4. 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  5. Hypertension.  6. Glaucoma.  7. Mini strokes with seizures.  The patient is not currently on any seizure     medication.  8. Gastroesophageal reflux.   PAST SURGICAL HISTORY:  1. Status post ruptured diverticulosis with colon resection and colostomy in     March 1999.  2. Status post colostomy revision in April 1999.  3. Status post colostomy reversal in October 1999.  4. Status post ventral herniorrhaphy times two (February 2000) with ultimate     anterior abdominal wall mesh placement at the end of the year 2000.  5. Status post cholecystectomy in 1985.  6. Status post carpal tunnel releases in  the year 2000.   MEDICATIONS:  1. Hydrochlorothiazide 12.5 mg p.o. daily.  2. Pepcid 20 mg p.o. daily.  3. Lipitor 10 mg p.o. daily.  4. Timoptic eye drops.  5. Baby aspirin,  which has been stopped.  6. Lexapro 10 mg, which was discontinued 10 months ago.  7. Albuterol nebulizer p.r.n.   ALLERGIES:  PENICILLIN and CINOBAC (antibiotic).   SOCIAL HISTORY:  The patient works as a Haematologist.  The patient also takes  care of her 33 year old mother.  The patient denies the use of tobacco,  alcohol and drugs.   FAMILY HISTORY:  Positive for coronary artery disease in the patient's  father who died at age 65.  The patient's mother has also suffered a  myocardial infarction.  The patient's mother and sister have a history of  hypertension.  The patient's mother and two brothers have a history of  hypercholesterolemia.  The patient's mother has had a stroke.  An aunt has  breast cancer.  The patient's brother and grandmother have diabetes  mellitus.   PHYSICAL EXAMINATION:  VITAL SIGNS:  The patient's blood pressure is 140/92.  GENERAL APPEARANCE:  The patient is a middle-aged female appearing older  than the stated  age.  She is in no acute distress.  HEENT:  Normocephalic and atraumatic.  LUNGS:  Lungs are clear to auscultation bilaterally and demonstrate no  rhonchi, rales or rubs.  HEART:  S1 and S2 with  regular rate and rhythm and no evidence of a murmur,  rub or gallop.  ABDOMEN:  There is a midline vertical incision and a right upper quadrant  oblique incision; both are without evidence of herniation.  The abdomen is  soft and nontender, and without evidence of hepatosplenomegaly or  organomegaly.  PELVIC EXAMINATION:  Normal external genitalia.  The urethra is normal and  without lesions or discharge.  The vagina demonstrates no lesions.  The  cervix is noted to be flush with the vagina.  The uterus is noted to be  small and nontender.  No adnexal masses nor tenderness are appreciated.   IMPRESSION:  The patient is a 65 year old gravida 1, para 1 postmenopausal  female with a history of diethylstilbestrol exposure and postmenopausal  bleeding.  It has not been possible to sample the endometrium and the  patient therefore presents for definitive evaluation an treatment of the  above.  Of note, the patient has had significant prior abdominal surgeries  including diverticulitis with a ruptured colon and several abdominal  surgeries which have followed including placement of permanent mesh of the  anterior abdominal wall.   PLAN:  The plan will be for the patient to undergo a total abdominal  hysterectomy with bilateral salpingo-oophorectomy, and possible frozen  section with lymph node evaluation and a staging procedure on April 16, 2003.  The patient has had consultation with Dr. De Blanch who  will be present for the patient's surgery.  The patient's general surgeon,  Dr. Abbey Chatters, will also be available on standby if needed because of the  anterior abdominal wall mesh.  The patient will undergo a preoperative bowel preparation the day prior to her surgery.  Risks, benefits  and alternatives  have been discussed with the patient who wishes to proceed.  Randye Lobo, M.D.   BES/MEDQ  D:  04/15/2003  T:  04/16/2003  Job:  [id number]

## 2010-11-13 NOTE — Op Note (Signed)
   NAME:  Kathleen Oliver, Kathleen Oliver                          ACCOUNT NO.:  [id number]   MEDICAL RECORD NO.:  [id number]                   PATIENT TYPE:  AMB   LOCATION:  DSC                                  FACILITY:  [facility]   PHYSICIAN:  Katy Fitch. Naaman Plummer., M.D.          DATE OF BIRTH:  August 19, 1945   DATE OF PROCEDURE:  05/01/2002  DATE OF DISCHARGE:                                 OPERATIVE REPORT   PREOPERATIVE DIAGNOSES:  Entrapment neuropathy of median nerve, left carpal  tunnel.   POSTOPERATIVE DIAGNOSES:  Entrapment neuropathy of median nerve, left carpal  tunnel.   OPERATION PERFORMED:  Release of left transverse carpal ligament.   SURGEON:  Katy Fitch. Sypher, M.D.   ASSISTANT:  Jonni Sanger, P.A.   ANESTHESIA:  General by mask.   SUPERVISING ANESTHESIOLOGIST:  Bedelia Person, M.D.   INDICATIONS FOR PROCEDURE:  The patient is a 65 year old woman who has been  noted to have significant hand numbness on the left consistent with carpal  tunnel syndrome.  Electrodiagnostic studies confirmed entrapment neuropathy  of the median nerve at the level of the left transverse carpal ligament.  Due to failure to respond to nonoperative measures, she is brought to the  operating room at this time for release of her left transverse carpal  ligament.   DESCRIPTION OF PROCEDURE:  The patient was brought to the operating room and  placed in supine position upon the operating table.  Following induction of  general anesthesia, the left arm was prepped with Betadine soap and solution  and sterilely draped.  Following exsanguination of the limb with an Esmarch  bandage, an arterial tourniquet on the proximal brachium was inflated to 220  mmHg.  The procedure commenced with a short incision in line with the ring  finger in the palm.  Subcutaneous tissues are carefully divided revealing  the palmar fascia.  This was split longitudinally to reveal the common  sensory branch of the median nerve.  This  was followed back to the  transverse carpal ligament which was carefully isolated from the median  nerve.  The ligament was released on its ulnar border extending to the  distal forearm.  This widely opened the carpal canal.  Bleeding points along  the margin of the released ligament were electrocauterized with bipolar  current followed by repair of the skin with intradermal 3-0 Prolene suture.   A compressive dressing was applied with a volar plaster splint maintaining  the wrist in five degrees dorsiflexion.                                                Katy Fitch Naaman Plummer., M.D.    RVS/MEDQ  D:  05/01/2002  T:  05/01/2002  Job:  [id number]

## 2010-11-13 NOTE — Op Note (Signed)
NAME:  Kathleen Oliver, Kathleen Oliver NO.:  [id number]   MEDICAL RECORD NO.:  [id number]                   PATIENT TYPE:  AMB   LOCATION:  SDC                                  FACILITY:  [facility]   PHYSICIAN:  Randye Lobo, M.D.                DATE OF BIRTH:  [birth year]   DATE OF PROCEDURE:  02/21/2003  DATE OF DISCHARGE:                                 OPERATIVE REPORT   PREOPERATIVE DIAGNOSES:  1. Postmenopausal bleeding.  2. Thickened endometrium.  3. Stenotic cervix.  4. Diethylstilbestrol exposure.   POSTOPERATIVE DIAGNOSES:  1. Postmenopausal bleeding.  2. Thickened endometrium.  3. Stenotic cervix.  4. Diethylstilbestrol exposure.   PROCEDURE:  Attempted ultrasound-guided dilatation and curettage.   SURGEON:  Randye Lobo, M.D.   ANESTHESIA:  General endotracheal.   INTRAVENOUS FLUIDS:  900 mL Ringers lactate.   ESTIMATED BLOOD LOSS:  Minimal.   URINE OUTPUT:  75 mL by I&O catheterization.   COMPLICATIONS:  None.   INDICATIONS FOR PROCEDURE:  The patient is a 65 year old, gravida 1, para 1,  Caucasian female with her last menstrual period approximately 7 years prior,  who presented to the office after having an ultrasound ordered by her  primary care Basem Yannuzzi which documented an 11-mm thickness of the  endometrium.  The ultrasound was ordered due to symptoms of pelvic  discomfort.  The uterus was noted to be normal in size.  The right ovary was  not seen and the left ovary was noted to be normal.  There was no free fluid  appreciated in the pelvis.  The patient reported that she had been having  pink vaginal spotting off and on for several years' duration.  The patient  had undergone premature menopause. The patient was not taking any hormone  replacement therapy.  She did report a history of DES exposure.   In the office, the cervix was noted to be stenotic at the apex of the  vagina, and an endometrial biopsy was attempted and not possible  due to the  cervical stenosis.   The patient was counseled regarding her options for care, and the decision  was made to proceed with an ultrasound-guided hysteroscopy and D&C after the  risks, benefits, and alternatives were discussed with her.   FINDINGS:  Examination under anesthesia revealed a small uterus with no  adnexal masses palpable.  With speculum placement in the vagina, the vagina  was noted to be atrophic.  The vaginal apex was flush with the cervix and  there was a question raised of two possible cervices side by side.  The  cervix was stenotic, and with ultrasound guidance, it could not be  successfully identified and dilated.   SPECIMENS:  None.   DESCRIPTION OF PROCEDURE:  With an IV in place, the patient was taken to the  operating room after she was properly identified.  The patient  received  general endotracheal anesthesia and was then placed in the dorsal lithotomy  position.  The patient's perineum, vagina and vulva were sterilely prepped  and draped and the bladder was catheterized of urine.  The patient was  sterilely draped.  The ultrasound technician began scanning transabdominally  and requested partial bladder refilling in order to improve imagery with the  ultrasound.   A speculum was then placed in the vagina and a single-tooth tenaculum was  placed on what was thought to be the anterior cervical lip.  With both an  uterine sound and a very fine tipped cervical dilator, an attempt was made  to identify the cervical os in some of the folds of tissue that were present  at the vaginal apex.  A single-tooth tenaculum was also placed on the  posterior cervical lip, however, this did not elucidate the cervical os.  A  decision, of course, was made at this time to abort the procedure.   All of the instruments were removed from the vagina after the vagina was  noted to be hemostatic.  The bladder was emptied of any remaining urine.  The patient was cleansed  of Betadine.   She was awakened and extubated and escorted to the recovery room in a stable  and awake condition.                                               Randye Lobo, M.D.    BES/MEDQ  D:  02/21/2003  T:  02/21/2003  Job:  [id number]

## 2011-01-26 ENCOUNTER — Other Ambulatory Visit: Payer: Self-pay | Admitting: Gastroenterology

## 2011-04-06 LAB — COMPREHENSIVE METABOLIC PANEL
ALT: 38 — ABNORMAL HIGH
AST: 31
Albumin: 3.4 — ABNORMAL LOW
Alkaline Phosphatase: 84
BUN: 12
CO2: 27
Calcium: 8.8
Chloride: 100
Creatinine, Ser: 0.8
GFR calc Af Amer: >60
GFR calc non Af Amer: >60
Glucose, Bld: 135 — ABNORMAL HIGH
Potassium: 3.9
Sodium: 136
Total Bilirubin: 0.6
Total Protein: 7.1

## 2011-04-06 LAB — DIFFERENTIAL
Basophils Absolute: 0
Basophils Relative: 0
Eosinophils Absolute: 0.1
Eosinophils Relative: 1
Lymphocytes Relative: 15
Lymphs Abs: 1.5
Monocytes Absolute: 0.4
Monocytes Relative: 5
Neutro Abs: 7.7
Neutrophils Relative %: 80 — ABNORMAL HIGH

## 2011-04-06 LAB — BASIC METABOLIC PANEL
BUN: 11
Calcium: 8.4
GFR calc non Af Amer: >60
Glucose, Bld: 145 — ABNORMAL HIGH
Sodium: 139

## 2011-04-06 LAB — CBC
HCT: 35.1 — ABNORMAL LOW
Hemoglobin: 11.7 — ABNORMAL LOW
MCHC: 33.5
MCV: 84.1
Platelets: 313
RBC: 4.17
RDW: 16.8 — ABNORMAL HIGH
WBC: 9.6

## 2011-04-06 LAB — POCT CARDIAC MARKERS
CKMB, poc: 1.3
CKMB, poc: <1 — ABNORMAL LOW
Operator id: 3206
Troponin i, poc: <0.05

## 2011-04-06 LAB — CK TOTAL AND CKMB (NOT AT ARMC)
CK, MB: 0.7
Total CK: 58

## 2011-04-06 LAB — CARDIAC PANEL(CRET KIN+CKTOT+MB+TROPI)
CK, MB: 0.8
Total CK: 64

## 2011-04-06 LAB — MAGNESIUM: Magnesium: 2.1

## 2011-04-06 LAB — D-DIMER, QUANTITATIVE: D-Dimer, Quant: 0.55 — ABNORMAL HIGH

## 2011-04-07 LAB — I-STAT 8, (EC8 V) (CONVERTED LAB)
Acid-Base Excess: 6 — ABNORMAL HIGH
HCT: 42
Hemoglobin: 14.3
Potassium: 4.8
Sodium: 139
TCO2: 34

## 2011-11-03 ENCOUNTER — Other Ambulatory Visit: Payer: Self-pay | Admitting: Dermatology

## 2012-04-29 ENCOUNTER — Emergency Department (HOSPITAL_COMMUNITY): Payer: BC Managed Care – PPO

## 2012-04-29 ENCOUNTER — Encounter (HOSPITAL_COMMUNITY): Payer: Self-pay | Admitting: *Deleted

## 2012-04-29 ENCOUNTER — Emergency Department (HOSPITAL_COMMUNITY)
Admission: EM | Admit: 2012-04-29 | Discharge: 2012-04-30 | Disposition: A | Discharge status: Home / Self Care | Payer: BC Managed Care – PPO | Attending: Emergency Medicine | Admitting: Emergency Medicine

## 2012-04-29 DIAGNOSIS — E119 Type 2 diabetes mellitus without complications: Secondary | ICD-10-CM | POA: Insufficient documentation

## 2012-04-29 DIAGNOSIS — I10 Essential (primary) hypertension: Secondary | ICD-10-CM | POA: Insufficient documentation

## 2012-04-29 DIAGNOSIS — Z9861 Coronary angioplasty status: Secondary | ICD-10-CM | POA: Insufficient documentation

## 2012-04-29 DIAGNOSIS — K219 Gastro-esophageal reflux disease without esophagitis: Secondary | ICD-10-CM

## 2012-04-29 DIAGNOSIS — Z79899 Other long term (current) drug therapy: Secondary | ICD-10-CM | POA: Insufficient documentation

## 2012-04-29 DIAGNOSIS — K224 Dyskinesia of esophagus: Secondary | ICD-10-CM

## 2012-04-29 DIAGNOSIS — Z9889 Other specified postprocedural states: Secondary | ICD-10-CM | POA: Insufficient documentation

## 2012-04-29 DIAGNOSIS — Z7982 Long term (current) use of aspirin: Secondary | ICD-10-CM | POA: Insufficient documentation

## 2012-04-29 HISTORY — DX: Essential (primary) hypertension: I10

## 2012-04-29 HISTORY — DX: Gastro-esophageal reflux disease without esophagitis: K21.9

## 2012-04-29 LAB — POCT I-STAT TROPONIN I: Troponin i, poc: 0.01 ng/mL (ref 0.00–0.08)

## 2012-04-29 LAB — BASIC METABOLIC PANEL
BUN: 22 mg/dL (ref 6–23)
GFR calc Af Amer: 71 mL/min — ABNORMAL LOW (ref >90)
GFR calc non Af Amer: 61 mL/min — ABNORMAL LOW (ref >90)
Potassium: 3.6 mEq/L (ref 3.5–5.1)
Sodium: 136 mEq/L (ref 135–145)

## 2012-04-29 LAB — HEPATIC FUNCTION PANEL
AST: 20 U/L (ref 0–37)
Bilirubin, Direct: <0.1 mg/dL (ref 0.0–0.3)

## 2012-04-29 LAB — CBC
HCT: 36.5 % (ref 36.0–46.0)
MCHC: 33.4 g/dL (ref 30.0–36.0)
RDW: 14.2 % (ref 11.5–15.5)

## 2012-04-29 MED ORDER — PANTOPRAZOLE SODIUM 40 MG IV SOLR
40.0000 mg | Freq: Once | INTRAVENOUS | Status: AC
  Administered 2012-04-29: 40 mg via INTRAVENOUS
  Filled 2012-04-29: qty 40

## 2012-04-29 MED ORDER — GI COCKTAIL ~~LOC~~
30.0000 mL | Freq: Once | ORAL | Status: AC
  Administered 2012-04-29: 30 mL via ORAL
  Filled 2012-04-29: qty 30

## 2012-04-29 MED ORDER — ONDANSETRON HCL 4 MG/2ML IJ SOLN
4.0000 mg | Freq: Once | INTRAMUSCULAR | Status: AC
  Administered 2012-04-29: 4 mg via INTRAVENOUS
  Filled 2012-04-29: qty 2

## 2012-04-29 NOTE — ED Provider Notes (Signed)
History     CSN: [id number]  Arrival date & time 04/29/12  2147   First MD Initiated Contact with Patient 04/29/12 2250      Chief Complaint  Patient presents with  . Chest Pain    (Consider location/radiation/quality/duration/timing/severity/associated sxs/prior treatment) Patient is a 66 y.o. female presenting with chest pain. The history is provided by the patient.  Chest Pain   She has been having nausea and a sensation that she has to burp a lot for about the last 2 days. Today, she developed a pressure feeling in her chest. Pressure feeling was moderate to severe and she rated at 7/10. It was slightly better following burping. Is no associated dyspnea or vomiting, but she did have some diaphoresis. Nothing seems to make the discomfort worse. Nothing improves it is belching. She does have history of having had an esophageal stricture treated with dilatation. Tonight, she called 911 and was instructed to take 4 baby aspirin while which he did. In triage, she was given a dose of nitroglycerin with partial relief. Pain is currently rated at 4/10.  Past Medical History  Diagnosis Date  . GERD (gastroesophageal reflux disease)   . Diabetes mellitus without complication   . Hypertension     Past Surgical History  Procedure Date  . Coronary angioplasty with stent placement   . Esophageal dilation     History reviewed. No pertinent family history.  History  Substance Use Topics  . Smoking status: Not on file  . Smokeless tobacco: Not on file  . Alcohol Use:     OB History    Grav Para Term Preterm Abortions TAB SAB Ect Mult Living                  Review of Systems  Cardiovascular: Positive for chest pain.  All other systems reviewed and are negative.    Allergies  Cinobac and Penicillins  Home Medications   Current Outpatient Rx  Name Route Sig Dispense Refill  . AMLODIPINE BESYLATE 5 MG PO TABS Oral Take 5 mg by mouth daily.    . ASPIRIN 81 MG PO CHEW  Oral Chew 81 mg by mouth daily. For chest pain    . ASPIRIN-DIPYRIDAMOLE ER 25-200 MG PO CP12 Oral Take 1 capsule by mouth 2 (two) times daily.    Marland Kitchen EZETIMIBE 10 MG PO TABS Oral Take 10 mg by mouth daily.    Marland Kitchen HYDROCHLOROTHIAZIDE 25 MG PO TABS Oral Take 25 mg by mouth daily.    Marland Kitchen LISINOPRIL 10 MG PO TABS Oral Take 10 mg by mouth daily.    Marland Kitchen PRAVASTATIN SODIUM 40 MG PO TABS Oral Take 40 mg by mouth daily.    . TRAMADOL HCL 50 MG PO TABS Oral Take 50 mg by mouth every 6 (six) hours as needed. For hip pain      BP 114/55  Pulse 76  Temp 98.2 F (36.8 C) (Oral)  Resp 17  SpO2 95%  Physical Exam  Nursing note and vitals reviewed. 66 year old female, resting comfortably and in no acute distress. Vital signs are normal. Oxygen saturation is 95%, which is normal. Head is normocephalic and atraumatic. PERRLA, EOMI. Oropharynx is clear. Neck is nontender and supple without adenopathy or JVD. Back is nontender and there is no CVA tenderness. Lungs are clear without rales, wheezes, or rhonchi. Chest is nontender. Heart has regular rate and rhythm without murmur. Abdomen is soft, flat,without masses or hepatosplenomegaly and peristalsis is normoactive. There  eis mild epigastric tenderness. Extremities have no cyanosis or edema, full range of motion is present. Skin is warm and dry without rash. Neurologic: Mental status is normal, cranial nerves are intact, there are no motor or sensory deficits.   ED Course  Procedures (including critical care time)  Results for orders placed during the hospital encounter of 04/29/12  CBC      Component Value Range   WBC 8.5  4.0 - 10.5 K/uL   RBC 4.18  3.87 - 5.11 MIL/uL   Hemoglobin 12.2  12.0 - 15.0 g/dL   HCT 40.9  81.1 - 91.4 %   MCV 87.3  78.0 - 100.0 fL   MCH 29.2  26.0 - 34.0 pg   MCHC 33.4  30.0 - 36.0 g/dL   RDW 78.2  95.6 - 21.3 %   Platelets 268  150 - 400 K/uL  BASIC METABOLIC PANEL      Component Value Range   Sodium 136  135 - 145  mEq/L   Potassium 3.6  3.5 - 5.1 mEq/L   Chloride 99  96 - 112 mEq/L   CO2 27  19 - 32 mEq/L   Glucose, Bld 162 (*) 70 - 99 mg/dL   BUN 22  6 - 23 mg/dL   Creatinine, Ser 0.86  0.50 - 1.10 mg/dL   Calcium 9.6  8.4 - 57.8 mg/dL   GFR calc non Af Amer 61 (*) >90 mL/min   GFR calc Af Amer 71 (*) >90 mL/min  HEPATIC FUNCTION PANEL      Component Value Range   Total Protein 7.3  6.0 - 8.3 g/dL   Albumin 3.7  3.5 - 5.2 g/dL   AST 20  0 - 37 U/L   ALT 20  0 - 35 U/L   Alkaline Phosphatase 69  39 - 117 U/L   Total Bilirubin 0.2 (*) 0.3 - 1.2 mg/dL   Bilirubin, Direct <4.6  0.0 - 0.3 mg/dL   Indirect Bilirubin NOT CALCULATED  0.3 - 0.9 mg/dL  LIPASE, BLOOD      Component Value Range   Lipase 40  11 - 59 U/L  POCT I-STAT TROPONIN I      Component Value Range   Troponin i, poc 0.01  0.00 - 0.08 ng/mL   Comment 3            Dg Chest Port 1 View  04/29/2012  *RADIOLOGY REPORT*  Clinical Data: Chest pain, weakness, nausea.  PORTABLE CHEST - 1 VIEW  Comparison: 06/02/2011  Findings: Heart and mediastinal contours are within normal limits. No focal opacities or effusions.  No acute bony abnormality.  IMPRESSION: No active cardiopulmonary disease.   Original Report Authenticated By: Charlett Nose, M.D.      Date: 04/29/2012  Rate: 74  Rhythm: normal sinus rhythm  QRS Axis: normal  Intervals: normal  ST/T Wave abnormalities: normal  Conduction Disutrbances:none  Narrative Interpretation: Normal; ECG. When compared with ECG of 05/07/2007, no significant changes are seen.  Old EKG Reviewed: unchanged    1. Esophageal spasm   2. GERD (gastroesophageal reflux disease)       MDM  Chest discomfort which is unlikely to be cardiac. I reviewed her past records and she had a cardiac catheterization in 2011 which showed completely clean coronary arteries. She has known esophageal disease with hiatus hernia and esophageal stricture which certainly would account for her discomfort and would have  partial response to nitroglycerin. Laboratory workup has been initiated, and  she will be given a therapeutic trial of a GI cocktail and IV pantoprazole.  Laboratory workup is unremarkable. She got significant relief with pantoprazole and GI cocktail. She states that she had taken as omeprazole in the past, so she is given a prescription for S. omeprazole and she is to followup with her PCP. She should consider repeat upper endoscopy.      Dione Booze, MD 04/30/12 [phone]

## 2012-04-29 NOTE — ED Notes (Signed)
Per EMS: pt c/o chest pressure since yesterday.  States "feel like I need to burp".  Some relief with burping.  Mid-sternal pressure, hx of GERD, hiatal hernia, and has had "esophagus stretched".  324 ASA PTA.  Given 1 nitro with some relief.   20 g in LAC.

## 2012-04-30 MED ORDER — ESOMEPRAZOLE MAGNESIUM 20 MG PO CPDR: 20.0000 mg | DELAYED_RELEASE_CAPSULE | Freq: Every day | ORAL | Status: DC

## 2012-04-30 NOTE — ED Notes (Signed)
Rx x 1.  Pt voiced understanding to f/u with PCP and return for worsening condition.  

## 2012-04-30 NOTE — ED Notes (Signed)
EDP at bedside  

## 2012-06-07 ENCOUNTER — Other Ambulatory Visit: Payer: Self-pay | Admitting: Gastroenterology

## 2012-06-07 ENCOUNTER — Ambulatory Visit
Admission: RE | Admit: 2012-06-07 | Discharge: 2012-06-07 | Disposition: A | Discharge status: Home / Self Care | Payer: Medicare Other | Source: Ambulatory Visit | Attending: Gastroenterology | Admitting: Gastroenterology

## 2012-06-07 DIAGNOSIS — R1310 Dysphagia, unspecified: Secondary | ICD-10-CM

## 2012-08-12 ENCOUNTER — Other Ambulatory Visit: Payer: Self-pay

## 2013-01-31 ENCOUNTER — Other Ambulatory Visit: Payer: Self-pay

## 2013-02-15 ENCOUNTER — Encounter: Payer: Medicare Other | Attending: Geriatric Medicine | Admitting: *Deleted

## 2013-02-15 ENCOUNTER — Encounter: Payer: Self-pay | Admitting: *Deleted

## 2013-02-15 VITALS — Ht 61.0 in | Wt 196.4 lb

## 2013-02-15 DIAGNOSIS — E119 Type 2 diabetes mellitus without complications: Secondary | ICD-10-CM | POA: Insufficient documentation

## 2013-02-15 DIAGNOSIS — Z713 Dietary counseling and surveillance: Secondary | ICD-10-CM | POA: Insufficient documentation

## 2013-02-15 NOTE — Patient Instructions (Addendum)
Plan:  Aim for 2-3 Carb Choices per meal (30-45 grams) +/- 1 either way  Aim for 0-15 Carbs per snack if hungry  Consider reading food labels for Total Carbohydrate of foods Consider  increasing your activity level by Arm Chair exercises for 15 minutes daily as tolerated Continue checking BG at alternate times per day as directed by MD

## 2013-02-15 NOTE — Progress Notes (Signed)
Appt start time: 1045 end time:  1145.  Assessment:  Patient was seen on  02/15/13 for individual diabetes education. Lives alone with her cat. Is eating out with her sister since 11/22/2022 when her niece passed away. History of diabetes since Nov 22, 2006, she lost about 40 pounds then but has gained some back lately and is back on Metformin again. SMBG twice a day with reported range of 110- 190 mg/dl. Unable to exercise due to foot and back pain.   Current HbA1c: 6.8%  MEDICATIONS: see list, diabetes medication is Metformin   DIETARY INTAKE:  Usual eating pattern includes 3 meals and 3 snacks per day.  Everyday foods include good variety of all food groups .  Avoided foods include sweet beverages, coffee, sausage, or fried foods usually.    24-hr recall:  B ( AM): egg white sandwich at [employer] OR scrambled egg sandwich at home OR oatmeal with walnuts with unsweet tea  Snk ( AM): cheese stick and fresh fruit OR 100 calorie snack items  L ( PM): eats out with sister at sit down restaurant, usually have meat, starch, vegetable or beans if no meat, unsweet tea OR  Snk ( PM): same as AM D ( PM): [employer] grilled chicken 6" sub occasionally with chips, unsweet tea Snk ( PM): applesauce or other canned fruit and graham crackers  Beverages: unsweet tea  Usual physical activity: limited due to foot and back pain  Estimated energy needs: 1400 calories 158 g carbohydrates 105 g protein 39 g fat  Progress Towards Goal(s):  In progress.   Nutritional Diagnosis:  NI-1.5 Excessive energy intake As related to activity level.  As evidenced by BMI of 37.2    Intervention:  Nutrition counseling provided.  Discussed diabetes disease process and treatment options.  Discussed physiology of diabetes and role of obesity on insulin resistance.  Encouraged moderate weight reduction to improve glucose levels.  Discussed role of medications and diet in glucose control  Provided education on macronutrients on glucose  levels.  Provided education on carb counting, importance of regularly scheduled meals/snacks, and meal planning  Discussed effects of physical activity on glucose levels and long-term glucose control.  Recommended 150 minutes of physical activity/week.  Reviewed patient medications.  Discussed role of medication on blood glucose and possible side effects  Discussed blood glucose monitoring and interpretation.  Discussed recommended target ranges and individual ranges.    Described short-term complications: hyper- and hypo-glycemia.  Discussed causes,symptoms, and treatment options. At next visit, plan to:  Discussed prevention, detection, and treatment of long-term complications.  Discussed the role of prolonged elevated glucose levels on body systems.  Discussed role of stress on blood glucose levels and discussed strategies to manage psychosocial issues.  Discussed recommendations for long-term diabetes self-care.  Established checklist for medical, dental, and emotional self-care.  Plan:  Aim for 2-3 Carb Choices per meal (30-45 grams) +/- 1 either way  Aim for 0-15 Carbs per snack if hungry  Consider reading food labels for Total Carbohydrate of foods Consider  increasing your activity level by Arm Chair exercises for 15 minutes daily as tolerated Continue checking BG at alternate times per day as directed by MD   Handouts given during visit include: Living Well with Diabetes Carb Counting and Food Label handouts Meal Plan Card  Barriers to learning/adherance to lifestyle change: frequent eating out with sister who is grieving the death of her daughter.  Diabetes self-care support plan:   [name] support group  Continued diabetes education  Monitoring/Evaluation:  Dietary intake, exercise, SMBG, and body weight in 4 week(s).

## 2013-03-15 ENCOUNTER — Ambulatory Visit: Payer: Medicare Other | Admitting: *Deleted

## 2013-03-22 ENCOUNTER — Ambulatory Visit: Payer: Medicare Other | Admitting: *Deleted

## 2013-04-26 ENCOUNTER — Ambulatory Visit: Payer: Medicare Other | Admitting: Interventional Cardiology

## 2013-05-03 ENCOUNTER — Other Ambulatory Visit: Payer: Self-pay

## 2013-05-29 ENCOUNTER — Encounter: Payer: Self-pay | Admitting: *Deleted

## 2013-05-29 ENCOUNTER — Encounter: Payer: Self-pay | Admitting: Interventional Cardiology

## 2013-05-29 DIAGNOSIS — E119 Type 2 diabetes mellitus without complications: Secondary | ICD-10-CM | POA: Insufficient documentation

## 2013-05-29 DIAGNOSIS — F419 Anxiety disorder, unspecified: Secondary | ICD-10-CM | POA: Insufficient documentation

## 2013-05-29 DIAGNOSIS — M1990 Unspecified osteoarthritis, unspecified site: Secondary | ICD-10-CM | POA: Insufficient documentation

## 2013-05-29 DIAGNOSIS — I10 Essential (primary) hypertension: Secondary | ICD-10-CM | POA: Insufficient documentation

## 2013-05-29 DIAGNOSIS — E669 Obesity, unspecified: Secondary | ICD-10-CM | POA: Insufficient documentation

## 2013-05-29 DIAGNOSIS — K219 Gastro-esophageal reflux disease without esophagitis: Secondary | ICD-10-CM | POA: Insufficient documentation

## 2013-05-29 DIAGNOSIS — E7800 Pure hypercholesterolemia, unspecified: Secondary | ICD-10-CM | POA: Insufficient documentation

## 2013-05-30 ENCOUNTER — Ambulatory Visit (INDEPENDENT_AMBULATORY_CARE_PROVIDER_SITE_OTHER): Payer: Medicare Other | Admitting: Interventional Cardiology

## 2013-05-30 ENCOUNTER — Encounter: Payer: Self-pay | Admitting: Cardiology

## 2013-05-30 ENCOUNTER — Encounter: Payer: Self-pay | Admitting: Interventional Cardiology

## 2013-05-30 VITALS — BP 130/80 | HR 71 | Ht 61.0 in | Wt 190.0 lb

## 2013-05-30 DIAGNOSIS — Z8249 Family history of ischemic heart disease and other diseases of the circulatory system: Secondary | ICD-10-CM

## 2013-05-30 DIAGNOSIS — E7800 Pure hypercholesterolemia, unspecified: Secondary | ICD-10-CM

## 2013-05-30 DIAGNOSIS — R0602 Shortness of breath: Secondary | ICD-10-CM

## 2013-05-30 DIAGNOSIS — E669 Obesity, unspecified: Secondary | ICD-10-CM

## 2013-05-30 DIAGNOSIS — Z23 Encounter for immunization: Secondary | ICD-10-CM

## 2013-05-30 DIAGNOSIS — Z0000 Encounter for general adult medical examination without abnormal findings: Secondary | ICD-10-CM

## 2013-05-30 DIAGNOSIS — I10 Essential (primary) hypertension: Secondary | ICD-10-CM

## 2013-05-30 NOTE — Progress Notes (Signed)
Patient ID: Kathleen Oliver, female   DOB: 1946-02-16, 67 y.o.   MRN: [id number]    [address] [place], [place]  [address] Phone: [phone] Fax:  [phone]  Date:  05/30/2013   ID:  Kathleen Oliver, Kathleen Oliver Jun 11, 1946, MRN [id number]  PCP:  Ginette Otto, MD      History of Present Illness: Kathleen Oliver is a 67 y.o. female with RF for CAD. She has had palpitations consistent with PVCs, but these have reduced. HTN worse in the AM. BP in the 150-160, but better since amlodipine was increased a few months ago. Later in the day, it is 115 mm Hg. Not walking a lot do to hip pain.  Cath in 2011 was clean.  No AAA or renal artery stenosis at that time.  Hypertension:  c/o Leg edema more at the end of the day.  c/o Shortness of breath with walking up stairs, and with housework.  She has to stop after 5 minutes. She can restart after a few minutes.  Getting worse. Denies : Chest pain.  Dizziness.  Orthopnea.  Paroxysmal nocturnal dyspnea.  Palpitations.  Syncope.  walking has decreased due to hip problems., SHe has gained weight.    Wt Readings from Last 3 Encounters:  05/30/13 190 lb (86.183 kg)  02/15/13 196 lb 6.4 oz (89.086 kg)     Past Medical History  Diagnosis Date  . GERD (gastroesophageal reflux disease)   . Diabetes mellitus without complication   . Hypertension   . Hypercholesteremia   . Obesity   . HTN (hypertension)   . Diabetes   . Anxiety   . Osteoarthrosis     Current Outpatient Prescriptions  Medication Sig Dispense Refill  . amLODipine (NORVASC) 10 MG tablet Take 10 mg by mouth daily.      Marland Kitchen aspirin 81 MG chewable tablet Chew 81 mg by mouth as needed. For chest pain      . cholecalciferol (VITAMIN D) 1000 UNITS tablet Take 1,000 Units by mouth daily.      . clopidogrel (PLAVIX) 75 MG tablet Take 75 mg by mouth daily with breakfast.      . ezetimibe (ZETIA) 10 MG tablet Take 10 mg by mouth daily.      . famotidine (PEPCID) 10 MG tablet  Take 10 mg by mouth daily.      . hydrochlorothiazide (HYDRODIURIL) 25 MG tablet Take 25 mg by mouth daily.      Marland Kitchen latanoprost (XALATAN) 0.005 % ophthalmic solution Place 1 drop into both eyes at bedtime.       Marland Kitchen lisinopril (PRINIVIL,ZESTRIL) 10 MG tablet Take 10 mg by mouth daily.      . metFORMIN (GLUCOPHAGE-XR) 750 MG 24 hr tablet Take 750 mg by mouth daily with breakfast.      . traMADol (ULTRAM) 50 MG tablet Take 50 mg by mouth every 6 (six) hours as needed. For hip pain       No current facility-administered medications for this visit.    Allergies:    Allergies  Allergen Reactions  . Cinobac [Cinoxacin]     hives  . Penicillins     Hives     Social History:  The patient  reports that she quit smoking about 31 years ago. She has never used smokeless tobacco. She reports that she does not drink alcohol.   Family History:  The patient's family history is not on file.   ROS:  Please see the  history of present illness.  No nausea, vomiting.  No fevers, chills.  No focal weakness.  No dysuria. Worsening dyspnea on exertion.   All other systems reviewed and negative.   PHYSICAL EXAM: VS:  BP 130/80  Pulse 71  Ht 5\' 1"  (1.549 m)  Wt 190 lb (86.183 kg)  BMI 35.92 kg/m2 Well nourished, well developed, in no acute distress HEENT: normal Neck: no JVD, no carotid bruits Cardiac:  normal S1, S2; RRR;  Lungs:  clear to auscultation bilaterally, no wheezing, rhonchi or rales Abd: soft, nontender, no hepatomegaly Ext: no edema Skin: warm and dry Neuro:   no focal abnormalities noted  EKG:  NSR, no ST segment changes    ASSESSMENT AND PLAN: SHOB: May be multifactorial.  Will check echoardiogram.  Mild MR and mild AI in 2011.  Normal LV function at that time.   Benign hypertension  Continue amlodipine at nighttime. This ,may help to lower morning BP.    2. PVCs (premature ventricular contractions)  Better. Decrease caffeine intake, particulary tea.    3. Obesity  Try to  exercise and improve diet to lose weight. Will be joining silver sneakers.    4. Hypercholesteremia  Stopped Pravastatin Sodium Tablet, 20 MG, 1/2 tablet, Orally, Once a day Continue Zetia Tablet, 10 MG, 1 tablet, Orally, Once a day LDL 184 at last check. Did not tolerate Lipitor in the past.  Increased CPK. Now intolerant of pravastatin. Back on Zetia  Family h/o CAD: Brother with stent several years ago. Diagnostic Imaging  EKG Harward,Amy 04/24/2012 09:49:53 AM > Azael Ragain,JAY 04/24/2012 10:01:15 AM > NSR, no ST segment changes   Preventive Medicine  Adult topics discussed:  Diet: weight loss, healthy diet, low calorie, low fat.  Exercise: at least 30 minutes of aerobic exercise, 5 days a week.    Procedure Codes     Signed, Fredric Mare, MD, [facility] 05/30/2013 10:13 AM

## 2013-05-30 NOTE — Patient Instructions (Signed)
Your physician has requested that you have an echocardiogram. Echocardiography is a painless test that uses sound waves to create images of your heart. It provides your doctor with information about the size and shape of your heart and how well your heart's chambers and valves are working. This procedure takes approximately one hour. There are no restrictions for this procedure.  Your physician wants you to follow-up in: 1 Year with Dr. Eldridge Dace. You will receive a reminder letter in the mail two months in advance. If you don't receive a letter, please call our office to schedule the follow-up appointment.  You will get your flu shot today.  Your physician recommends that you continue on your current medications as directed. Please refer to the Current Medication list given to you today.

## 2013-05-30 NOTE — Addendum Note (Signed)
Addended by: Kyla Balzarine A on: 05/30/2013 10:46 AM   Modules accepted: Orders

## 2013-06-14 ENCOUNTER — Other Ambulatory Visit: Payer: Self-pay

## 2013-06-14 ENCOUNTER — Ambulatory Visit (HOSPITAL_COMMUNITY): Payer: Medicare Other | Attending: Interventional Cardiology | Admitting: Radiology

## 2013-06-14 DIAGNOSIS — R609 Edema, unspecified: Secondary | ICD-10-CM

## 2013-06-14 DIAGNOSIS — R0602 Shortness of breath: Secondary | ICD-10-CM | POA: Insufficient documentation

## 2013-06-14 NOTE — Progress Notes (Signed)
Echocardiogram performed.  

## 2013-06-18 ENCOUNTER — Telehealth: Payer: Self-pay | Admitting: Interventional Cardiology

## 2013-06-18 NOTE — Telephone Encounter (Signed)
rtc pts call with results.  

## 2013-06-18 NOTE — Telephone Encounter (Signed)
Follow up    Pt needs a call back with results from 12/18 Echo please.

## 2013-07-11 ENCOUNTER — Other Ambulatory Visit: Payer: Self-pay

## 2013-07-11 MED ORDER — HYDROCHLOROTHIAZIDE 25 MG PO TABS: 25.0000 mg | ORAL_TABLET | Freq: Every day | ORAL | Status: DC

## 2013-10-03 ENCOUNTER — Ambulatory Visit (HOSPITAL_COMMUNITY)
Admission: RE | Admit: 2013-10-03 | Discharge: 2013-10-03 | Disposition: A | Discharge status: Home / Self Care | Payer: Medicare HMO | Source: Ambulatory Visit | Attending: Geriatric Medicine | Admitting: Geriatric Medicine

## 2013-10-03 ENCOUNTER — Other Ambulatory Visit (HOSPITAL_COMMUNITY): Payer: Self-pay | Admitting: Geriatric Medicine

## 2013-10-03 DIAGNOSIS — M7989 Other specified soft tissue disorders: Secondary | ICD-10-CM | POA: Diagnosis not present

## 2013-10-03 DIAGNOSIS — M79609 Pain in unspecified limb: Secondary | ICD-10-CM

## 2013-10-03 NOTE — Progress Notes (Signed)
*  PRELIMINARY RESULTS* Vascular Ultrasound Right lower extremity venous duplex has been completed.  Preliminary findings: No evidence of DVT or baker's cyst.  Attempted call report to Dr. Felipa Eth. Left voice message for Lelan Pons.   Landry Mellow, RDMS, RVT  10/03/2013, 11:43 AM

## 2014-04-28 HISTORY — PX: TRIGGER FINGER RELEASE: SHX641

## 2014-07-15 DIAGNOSIS — I10 Essential (primary) hypertension: Secondary | ICD-10-CM | POA: Diagnosis not present

## 2014-07-15 DIAGNOSIS — R197 Diarrhea, unspecified: Secondary | ICD-10-CM | POA: Diagnosis not present

## 2014-07-15 DIAGNOSIS — M25569 Pain in unspecified knee: Secondary | ICD-10-CM | POA: Diagnosis not present

## 2014-07-15 DIAGNOSIS — E119 Type 2 diabetes mellitus without complications: Secondary | ICD-10-CM | POA: Diagnosis not present

## 2014-07-25 DIAGNOSIS — M25561 Pain in right knee: Secondary | ICD-10-CM | POA: Diagnosis not present

## 2014-07-25 DIAGNOSIS — M1711 Unilateral primary osteoarthritis, right knee: Secondary | ICD-10-CM | POA: Diagnosis not present

## 2014-08-22 ENCOUNTER — Other Ambulatory Visit: Payer: Self-pay | Admitting: Internal Medicine

## 2014-08-22 ENCOUNTER — Encounter (HOSPITAL_COMMUNITY): Payer: Self-pay

## 2014-08-22 ENCOUNTER — Ambulatory Visit
Admission: RE | Admit: 2014-08-22 | Discharge: 2014-08-22 | Disposition: A | Discharge status: Home / Self Care | Payer: Commercial Managed Care - HMO | Source: Ambulatory Visit | Attending: Internal Medicine | Admitting: Internal Medicine

## 2014-08-22 ENCOUNTER — Inpatient Hospital Stay (HOSPITAL_COMMUNITY)
Admission: EM | Admit: 2014-08-22 | Discharge: 2014-08-25 | DRG: 392 | Disposition: A | Discharge status: Home / Self Care | Payer: Commercial Managed Care - HMO | Attending: Internal Medicine | Admitting: Internal Medicine

## 2014-08-22 DIAGNOSIS — I2510 Atherosclerotic heart disease of native coronary artery without angina pectoris: Secondary | ICD-10-CM | POA: Diagnosis not present

## 2014-08-22 DIAGNOSIS — Z79899 Other long term (current) drug therapy: Secondary | ICD-10-CM

## 2014-08-22 DIAGNOSIS — R1032 Left lower quadrant pain: Secondary | ICD-10-CM | POA: Diagnosis not present

## 2014-08-22 DIAGNOSIS — R109 Unspecified abdominal pain: Secondary | ICD-10-CM

## 2014-08-22 DIAGNOSIS — Z955 Presence of coronary angioplasty implant and graft: Secondary | ICD-10-CM | POA: Diagnosis not present

## 2014-08-22 DIAGNOSIS — K625 Hemorrhage of anus and rectum: Secondary | ICD-10-CM | POA: Diagnosis not present

## 2014-08-22 DIAGNOSIS — Z7902 Long term (current) use of antithrombotics/antiplatelets: Secondary | ICD-10-CM | POA: Diagnosis not present

## 2014-08-22 DIAGNOSIS — E780 Pure hypercholesterolemia: Secondary | ICD-10-CM | POA: Diagnosis present

## 2014-08-22 DIAGNOSIS — Z9049 Acquired absence of other specified parts of digestive tract: Secondary | ICD-10-CM | POA: Diagnosis present

## 2014-08-22 DIAGNOSIS — A09 Infectious gastroenteritis and colitis, unspecified: Principal | ICD-10-CM | POA: Diagnosis present

## 2014-08-22 DIAGNOSIS — N281 Cyst of kidney, acquired: Secondary | ICD-10-CM | POA: Diagnosis not present

## 2014-08-22 DIAGNOSIS — Z87891 Personal history of nicotine dependence: Secondary | ICD-10-CM

## 2014-08-22 DIAGNOSIS — K922 Gastrointestinal hemorrhage, unspecified: Secondary | ICD-10-CM | POA: Diagnosis present

## 2014-08-22 DIAGNOSIS — I10 Essential (primary) hypertension: Secondary | ICD-10-CM | POA: Diagnosis present

## 2014-08-22 DIAGNOSIS — K210 Gastro-esophageal reflux disease with esophagitis: Secondary | ICD-10-CM | POA: Diagnosis not present

## 2014-08-22 DIAGNOSIS — K219 Gastro-esophageal reflux disease without esophagitis: Secondary | ICD-10-CM | POA: Diagnosis present

## 2014-08-22 DIAGNOSIS — K529 Noninfective gastroenteritis and colitis, unspecified: Secondary | ICD-10-CM | POA: Diagnosis not present

## 2014-08-22 DIAGNOSIS — E119 Type 2 diabetes mellitus without complications: Secondary | ICD-10-CM | POA: Diagnosis not present

## 2014-08-22 DIAGNOSIS — D62 Acute posthemorrhagic anemia: Secondary | ICD-10-CM | POA: Diagnosis present

## 2014-08-22 HISTORY — DX: Personal history of other diseases of the digestive system: Z87.19

## 2014-08-22 HISTORY — DX: Unspecified asthma, uncomplicated: J45.909

## 2014-08-22 HISTORY — DX: Adverse effect of unspecified anesthetic, initial encounter: T41.45XA

## 2014-08-22 HISTORY — DX: Unspecified chronic bronchitis: J42

## 2014-08-22 HISTORY — DX: Cerebral infarction, unspecified: I63.9

## 2014-08-22 HISTORY — DX: Type 2 diabetes mellitus without complications: E11.9

## 2014-08-22 HISTORY — DX: Pneumonia, unspecified organism: J18.9

## 2014-08-22 HISTORY — DX: Unspecified convulsions: R56.9

## 2014-08-22 LAB — CBC WITH DIFFERENTIAL/PLATELET
BASOS PCT: 0 % (ref 0–1)
Basophils Absolute: 0 10*3/uL (ref 0.0–0.1)
EOS ABS: 0.1 10*3/uL (ref 0.0–0.7)
EOS PCT: 1 % (ref 0–5)
HEMATOCRIT: 37.1 % (ref 36.0–46.0)
Hemoglobin: 12.2 g/dL (ref 12.0–15.0)
LYMPHS PCT: 16 % (ref 12–46)
Lymphs Abs: 1.7 10*3/uL (ref 0.7–4.0)
MCH: 29.3 pg (ref 26.0–34.0)
MCHC: 32.9 g/dL (ref 30.0–36.0)
MCV: 89 fL (ref 78.0–100.0)
Monocytes Absolute: 1 10*3/uL (ref 0.1–1.0)
Monocytes Relative: 9 % (ref 3–12)
NEUTROS ABS: 8 10*3/uL — AB (ref 1.7–7.7)
Neutrophils Relative %: 74 % (ref 43–77)
PLATELETS: 285 10*3/uL (ref 150–400)
RBC: 4.17 MIL/uL (ref 3.87–5.11)
RDW: 14.3 % (ref 11.5–15.5)
WBC: 10.8 10*3/uL — AB (ref 4.0–10.5)

## 2014-08-22 LAB — COMPREHENSIVE METABOLIC PANEL
ALK PHOS: 66 U/L (ref 39–117)
ALT: 18 U/L (ref 0–35)
ANION GAP: 13 (ref 5–15)
AST: 20 U/L (ref 0–37)
Albumin: 3.8 g/dL (ref 3.5–5.2)
BILIRUBIN TOTAL: 0.4 mg/dL (ref 0.3–1.2)
BUN: 10 mg/dL (ref 6–23)
CHLORIDE: 98 mmol/L (ref 96–112)
CO2: 23 mmol/L (ref 19–32)
Calcium: 9.1 mg/dL (ref 8.4–10.5)
Creatinine, Ser: 0.83 mg/dL (ref 0.50–1.10)
GFR, EST AFRICAN AMERICAN: 83 mL/min — AB (ref >90)
GFR, EST NON AFRICAN AMERICAN: 71 mL/min — AB (ref >90)
GLUCOSE: 126 mg/dL — AB (ref 70–99)
POTASSIUM: 3.7 mmol/L (ref 3.5–5.1)
SODIUM: 134 mmol/L — AB (ref 135–145)
TOTAL PROTEIN: 7 g/dL (ref 6.0–8.3)

## 2014-08-22 MED ORDER — CLINDAMYCIN PHOSPHATE 600 MG/50ML IV SOLN
600.0000 mg | Freq: Once | INTRAVENOUS | Status: AC
Start: 2014-08-22 — End: 2014-08-23
  Administered 2014-08-23: 600 mg via INTRAVENOUS
  Filled 2014-08-22: qty 50

## 2014-08-22 MED ORDER — IOHEXOL 300 MG/ML  SOLN
100.0000 mL | Freq: Once | INTRAMUSCULAR | Status: AC | PRN
  Administered 2014-08-22: 100 mL via INTRAVENOUS

## 2014-08-22 MED ORDER — METRONIDAZOLE IN NACL 5-0.79 MG/ML-% IV SOLN
500.0000 mg | Freq: Once | INTRAVENOUS | Status: AC
  Administered 2014-08-22: 500 mg via INTRAVENOUS
  Filled 2014-08-22: qty 100

## 2014-08-22 MED ORDER — MORPHINE SULFATE 4 MG/ML IJ SOLN
4.0000 mg | Freq: Once | INTRAMUSCULAR | Status: AC
  Administered 2014-08-22: 4 mg via INTRAVENOUS
  Filled 2014-08-22: qty 1

## 2014-08-22 NOTE — ED Notes (Signed)
Pt with left-sided abdominal pain that started yesterday.  Pt saw PCP today and had CT scan showing colitis.  Sent to ED for IV fluids, antibiotics and possible admission.

## 2014-08-22 NOTE — ED Provider Notes (Signed)
CSN: [id number]     Arrival date & time 08/22/14  2001 History   First MD Initiated Contact with Patient 08/22/14 2041     Chief Complaint  Patient presents with  . Abdominal Pain     (Consider location/radiation/quality/duration/timing/severity/associated sxs/prior Treatment) HPI [place] of left-sided abdominal pain onset yesterday afternoon. Accompanied by bloody diarrhea. No fever. No vomiting. No other symptoms. Seen by his primary care physician this afternoon had CT scan which showed colitis. No treatment prior to coming here. Told to come here for further evaluation. Pain waxes and wanes, left-sided abdominal. Nothing makes symptoms better or worse. Pain is moderate at present. No other associated symptoms Past Medical History  Diagnosis Date  . GERD (gastroesophageal reflux disease)   . Diabetes mellitus without complication   . Hypertension   . Hypercholesteremia   . Obesity   . HTN (hypertension)   . Diabetes   . Anxiety   . Osteoarthrosis    Past Surgical History  Procedure Laterality Date  . Coronary angioplasty with stent placement    . Esophageal dilation    . Bladder suspension     Family History  Problem Relation Age of Onset  . Family history unknown: Yes   History  Substance Use Topics  . Smoking status: Former Smoker    Quit date: 02/15/1982  . Smokeless tobacco: Never Used  . Alcohol Use: No   OB History    No data available     Review of Systems  Constitutional: Negative.   HENT: Negative.   Respiratory: Negative.   Cardiovascular: Negative.   Gastrointestinal: Positive for abdominal pain, diarrhea and blood in stool.  Musculoskeletal: Negative.   Skin: Negative.   Neurological: Negative.   Psychiatric/Behavioral: Negative.   All other systems reviewed and are negative.     Allergies  Cinobac; Codeine; and Penicillins  Home Medications   Prior to Admission medications   Medication Sig Start Date End Date Taking? Authorizing  Provider  acetaminophen (TYLENOL) 500 MG tablet Take 500 mg by mouth every 6 (six) hours as needed for headache.   Yes Historical Provider, MD  amLODipine (NORVASC) 10 MG tablet Take 10 mg by mouth daily.   Yes Historical Provider, MD  clopidogrel (PLAVIX) 75 MG tablet Take 75 mg by mouth daily with breakfast.   Yes Historical Provider, MD  famotidine (PEPCID) 10 MG tablet Take 10 mg by mouth daily.   Yes Historical Provider, MD  hydrochlorothiazide (HYDRODIURIL) 25 MG tablet Take 1 tablet (25 mg total) by mouth daily. 07/11/13  Yes Jettie Booze, MD  latanoprost (XALATAN) 0.005 % ophthalmic solution Place 1 drop into both eyes at bedtime.  05/15/13  Yes Historical Provider, MD  lisinopril (PRINIVIL,ZESTRIL) 10 MG tablet Take 10 mg by mouth daily.   Yes Historical Provider, MD  loperamide (IMODIUM) 1 MG/5ML solution Take 2 mg by mouth as needed for diarrhea or loose stools.   Yes Historical Provider, MD  metFORMIN (GLUCOPHAGE-XR) 500 MG 24 hr tablet Take 500 mg by mouth daily. 08/14/14  Yes Historical Provider, MD  Simethicone (GAS-X PO) Take 1 capsule by mouth daily as needed (abdominal pain).   Yes Historical Provider, MD  sitaGLIPtin (JANUVIA) 100 MG tablet Take 100 mg by mouth daily.   Yes Historical Provider, MD  traMADol (ULTRAM) 50 MG tablet Take 50 mg by mouth every 6 (six) hours as needed. For hip pain   Yes Historical Provider, MD  ciprofloxacin (CIPRO) 500 MG tablet Take 500 mg by mouth  2 (two) times daily.  08/22/14   Historical Provider, MD  metroNIDAZOLE (FLAGYL) 500 MG tablet Take 500 mg by mouth 3 (three) times daily. 08/22/14   Historical Provider, MD   BP 124/57 mmHg  Pulse 70  Temp(Src) 98.5 F (36.9 C) (Oral)  Resp 13  SpO2 96% Physical Exam  Constitutional: She appears well-developed and well-nourished.  HENT:  Head: Normocephalic and atraumatic.  Eyes: Conjunctivae are normal. Pupils are equal, round, and reactive to light.  Neck: Neck supple. No tracheal deviation  present. No thyromegaly present.  Cardiovascular: Normal rate and regular rhythm.   No murmur heard. Pulmonary/Chest: Effort normal and breath sounds normal.  Abdominal: Soft. Bowel sounds are normal. She exhibits no distension and no mass. There is tenderness. There is no rebound and no guarding.  Tender at left lower quadrant  Genitourinary: Guaiac positive stool.  Maroon stool, gross blood  Musculoskeletal: Normal range of motion. She exhibits no edema or tenderness.  Neurological: She is alert. Coordination normal.  Skin: Skin is warm and dry. No rash noted.  Psychiatric: She has a normal mood and affect.  Nursing note and vitals reviewed.   ED Course  Procedures (including critical care time) Labs Review Labs Reviewed  COMPREHENSIVE METABOLIC PANEL - Abnormal; Notable for the following:    Sodium 134 (*)    Glucose, Bld 126 (*)    GFR calc non Af Amer 71 (*)    GFR calc Af Amer 83 (*)    All other components within normal limits  CBC WITH DIFFERENTIAL/PLATELET    Imaging Review Ct Abdomen Pelvis W Contrast  08/22/2014   CLINICAL DATA:  Left-sided abdominal pain. Rectal bleeding. Diverticulitis.  EXAM: CT ABDOMEN AND PELVIS WITH CONTRAST  TECHNIQUE: Multidetector CT imaging of the abdomen and pelvis was performed using the standard protocol following bolus administration of intravenous contrast.  CONTRAST:  147mL OMNIPAQUE IOHEXOL 300 MG/ML  SOLN  COMPARISON:  CT 07/27/2006.  FINDINGS: Musculoskeletal: Lumbar spondylosis. No aggressive osseous lesions. Bilateral hip osteoarthritis incidentally noted.  Lung Bases: Atelectasis.  Liver: Normal. Hypervascular anterior RIGHT hepatic lobe liver lesion seen on the prior exam is not visible, probably due to phase of contrast.  Spleen:  Normal.  Gallbladder:  Cholecystectomy.  Common bile duct:  Normal.  Pancreas:  Normal.  Adrenal glands:  Normal bilaterally.  Kidneys: LEFT renal sinus cysts. RIGHT kidney appears normal. Normal enhancement  and delayed excretion of contrast. Both ureters are within normal limits.  Stomach:  Small hiatal hernia.  Proximal stomach is collapsed.  Small bowel: Duodenum appears normal. There is no small bowel obstruction. No small bowel inflammatory changes.  Colon: The ileocecal valve is in a protuberant lower abdomen in the midline. Ascending colon is within normal limits. Redundant transverse colon. At the splenic flexure the colon, there is mural thickening and pericolonic inflammatory changes. This extends through the descending colon. Minimal involvement of the rectosigmoid. Diverticulosis is present without diverticulitis.  Pelvic Genitourinary: Hysterectomy. Urinary bladder is partially decompressed.  Peritoneum: No intra-abdominal free air.  No free fluid.  Vasculature: Mild atherosclerosis. No acute vascular abnormality. Mesenteric vessels appear to opacified normally.  Body Wall: Lax anterior abdominal wall with rectus atrophy. Lower abdominal wall herniorrhaphy without recurrent hernia. No change in the lower abdominal wall compared to prior most superficial abdominal wall, with interposition of fat between the and the native abdominal wall.  IMPRESSION: 1. Descending colitis. Differential considerations are enteric infection, inflammatory bowel disease, or ischemic colitis. 2. Cholecystectomy and hysterectomy.  3. LEFT renal sinus cysts. 4. Inferior abdominal wall herniorrhaphy.   Electronically Signed   By: Dereck Ligas M.D.   On: 08/22/2014 17:38   Results for orders placed or performed during the hospital encounter of 08/22/14  CBC with Differential  Result Value Ref Range   WBC 10.8 (H) 4.0 - 10.5 K/uL   RBC 4.17 3.87 - 5.11 MIL/uL   Hemoglobin 12.2 12.0 - 15.0 g/dL   HCT 37.1 36.0 - 46.0 %   MCV 89.0 78.0 - 100.0 fL   MCH 29.3 26.0 - 34.0 pg   MCHC 32.9 30.0 - 36.0 g/dL   RDW 14.3 11.5 - 15.5 %   Platelets 285 150 - 400 K/uL   Neutrophils Relative % 74 43 - 77 %   Neutro Abs 8.0 (H) 1.7 -  7.7 K/uL   Lymphocytes Relative 16 12 - 46 %   Lymphs Abs 1.7 0.7 - 4.0 K/uL   Monocytes Relative 9 3 - 12 %   Monocytes Absolute 1.0 0.1 - 1.0 K/uL   Eosinophils Relative 1 0 - 5 %   Eosinophils Absolute 0.1 0.0 - 0.7 K/uL   Basophils Relative 0 0 - 1 %   Basophils Absolute 0.0 0.0 - 0.1 K/uL  Comprehensive metabolic panel  Result Value Ref Range   Sodium 134 (L) 135 - 145 mmol/L   Potassium 3.7 3.5 - 5.1 mmol/L   Chloride 98 96 - 112 mmol/L   CO2 23 19 - 32 mmol/L   Glucose, Bld 126 (H) 70 - 99 mg/dL   BUN 10 6 - 23 mg/dL   Creatinine, Ser 0.83 0.50 - 1.10 mg/dL   Calcium 9.1 8.4 - 10.5 mg/dL   Total Protein 7.0 6.0 - 8.3 g/dL   Albumin 3.8 3.5 - 5.2 g/dL   AST 20 0 - 37 U/L   ALT 18 0 - 35 U/L   Alkaline Phosphatase 66 39 - 117 U/L   Total Bilirubin 0.4 0.3 - 1.2 mg/dL   GFR calc non Af Amer 71 (L) >90 mL/min   GFR calc Af Amer 83 (L) >90 mL/min   Anion gap 13 5 - 15   Ct Abdomen Pelvis W Contrast  08/22/2014   CLINICAL DATA:  Left-sided abdominal pain. Rectal bleeding. Diverticulitis.  EXAM: CT ABDOMEN AND PELVIS WITH CONTRAST  TECHNIQUE: Multidetector CT imaging of the abdomen and pelvis was performed using the standard protocol following bolus administration of intravenous contrast.  CONTRAST:  155mL OMNIPAQUE IOHEXOL 300 MG/ML  SOLN  COMPARISON:  CT 07/27/2006.  FINDINGS: Musculoskeletal: Lumbar spondylosis. No aggressive osseous lesions. Bilateral hip osteoarthritis incidentally noted.  Lung Bases: Atelectasis.  Liver: Normal. Hypervascular anterior RIGHT hepatic lobe liver lesion seen on the prior exam is not visible, probably due to phase of contrast.  Spleen:  Normal.  Gallbladder:  Cholecystectomy.  Common bile duct:  Normal.  Pancreas:  Normal.  Adrenal glands:  Normal bilaterally.  Kidneys: LEFT renal sinus cysts. RIGHT kidney appears normal. Normal enhancement and delayed excretion of contrast. Both ureters are within normal limits.  Stomach:  Small hiatal hernia.   Proximal stomach is collapsed.  Small bowel: Duodenum appears normal. There is no small bowel obstruction. No small bowel inflammatory changes.  Colon: The ileocecal valve is in a protuberant lower abdomen in the midline. Ascending colon is within normal limits. Redundant transverse colon. At the splenic flexure the colon, there is mural thickening and pericolonic inflammatory changes. This extends through the descending colon. Minimal involvement  of the rectosigmoid. Diverticulosis is present without diverticulitis.  Pelvic Genitourinary: Hysterectomy. Urinary bladder is partially decompressed.  Peritoneum: No intra-abdominal free air.  No free fluid.  Vasculature: Mild atherosclerosis. No acute vascular abnormality. Mesenteric vessels appear to opacified normally.  Body Wall: Lax anterior abdominal wall with rectus atrophy. Lower abdominal wall herniorrhaphy without recurrent hernia. No change in the lower abdominal wall compared to prior most superficial abdominal wall, with interposition of fat between the and the native abdominal wall.  IMPRESSION: 1. Descending colitis. Differential considerations are enteric infection, inflammatory bowel disease, or ischemic colitis. 2. Cholecystectomy and hysterectomy. 3. LEFT renal sinus cysts. 4. Inferior abdominal wall herniorrhaphy.   Electronically Signed   By: Dereck Ligas M.D.   On: 08/22/2014 17:38     EKG Interpretation None     12:10 AM feels improved after treatment with intravenous fluids and opioids. Patient resting comfortably. MDM  Spoke with Dr.Niu plan admit medical surgical floor.pain control iv antibiotics Final diagnoses:  None   diagnosis #1 colitis #2 lower GI bleeding      Orlie Dakin, MD 08/23/14 [id number]

## 2014-08-23 ENCOUNTER — Encounter (HOSPITAL_COMMUNITY): Payer: Self-pay | Admitting: *Deleted

## 2014-08-23 DIAGNOSIS — A09 Infectious gastroenteritis and colitis, unspecified: Secondary | ICD-10-CM | POA: Diagnosis present

## 2014-08-23 DIAGNOSIS — Z955 Presence of coronary angioplasty implant and graft: Secondary | ICD-10-CM | POA: Diagnosis not present

## 2014-08-23 DIAGNOSIS — Z87891 Personal history of nicotine dependence: Secondary | ICD-10-CM | POA: Diagnosis not present

## 2014-08-23 DIAGNOSIS — K219 Gastro-esophageal reflux disease without esophagitis: Secondary | ICD-10-CM | POA: Diagnosis not present

## 2014-08-23 DIAGNOSIS — E119 Type 2 diabetes mellitus without complications: Secondary | ICD-10-CM | POA: Diagnosis present

## 2014-08-23 DIAGNOSIS — I2510 Atherosclerotic heart disease of native coronary artery without angina pectoris: Secondary | ICD-10-CM | POA: Diagnosis not present

## 2014-08-23 DIAGNOSIS — I10 Essential (primary) hypertension: Secondary | ICD-10-CM | POA: Diagnosis present

## 2014-08-23 DIAGNOSIS — K529 Noninfective gastroenteritis and colitis, unspecified: Secondary | ICD-10-CM | POA: Diagnosis present

## 2014-08-23 DIAGNOSIS — K210 Gastro-esophageal reflux disease with esophagitis: Secondary | ICD-10-CM

## 2014-08-23 DIAGNOSIS — K922 Gastrointestinal hemorrhage, unspecified: Secondary | ICD-10-CM | POA: Diagnosis present

## 2014-08-23 DIAGNOSIS — D62 Acute posthemorrhagic anemia: Secondary | ICD-10-CM | POA: Diagnosis present

## 2014-08-23 DIAGNOSIS — E780 Pure hypercholesterolemia: Secondary | ICD-10-CM | POA: Diagnosis present

## 2014-08-23 DIAGNOSIS — Z9049 Acquired absence of other specified parts of digestive tract: Secondary | ICD-10-CM | POA: Diagnosis present

## 2014-08-23 DIAGNOSIS — Z79899 Other long term (current) drug therapy: Secondary | ICD-10-CM | POA: Diagnosis not present

## 2014-08-23 DIAGNOSIS — Z7902 Long term (current) use of antithrombotics/antiplatelets: Secondary | ICD-10-CM | POA: Diagnosis not present

## 2014-08-23 LAB — CBC
HCT: 32.3 % — ABNORMAL LOW (ref 36.0–46.0)
HEMOGLOBIN: 10.5 g/dL — AB (ref 12.0–15.0)
MCH: 29 pg (ref 26.0–34.0)
MCHC: 32.5 g/dL (ref 30.0–36.0)
MCV: 89.2 fL (ref 78.0–100.0)
PLATELETS: 230 10*3/uL (ref 150–400)
RBC: 3.62 MIL/uL — ABNORMAL LOW (ref 3.87–5.11)
RDW: 14.3 % (ref 11.5–15.5)
WBC: 9 10*3/uL (ref 4.0–10.5)

## 2014-08-23 LAB — COMPREHENSIVE METABOLIC PANEL
ALT: 74 U/L — AB (ref 0–35)
AST: 134 U/L — AB (ref 0–37)
Albumin: 3 g/dL — ABNORMAL LOW (ref 3.5–5.2)
Alkaline Phosphatase: 92 U/L (ref 39–117)
Anion gap: 9 (ref 5–15)
BUN: 7 mg/dL (ref 6–23)
CALCIUM: 8.4 mg/dL (ref 8.4–10.5)
CO2: 28 mmol/L (ref 19–32)
CREATININE: 0.8 mg/dL (ref 0.50–1.10)
Chloride: 104 mmol/L (ref 96–112)
GFR calc Af Amer: 86 mL/min — ABNORMAL LOW (ref >90)
GFR, EST NON AFRICAN AMERICAN: 75 mL/min — AB (ref >90)
Glucose, Bld: 131 mg/dL — ABNORMAL HIGH (ref 70–99)
Potassium: 3.7 mmol/L (ref 3.5–5.1)
SODIUM: 141 mmol/L (ref 135–145)
TOTAL PROTEIN: 6.1 g/dL (ref 6.0–8.3)
Total Bilirubin: 0.9 mg/dL (ref 0.3–1.2)

## 2014-08-23 LAB — GLUCOSE, CAPILLARY
GLUCOSE-CAPILLARY: 140 mg/dL — AB (ref 70–99)
GLUCOSE-CAPILLARY: 118 mg/dL — AB (ref 70–99)
Glucose-Capillary: 106 mg/dL — ABNORMAL HIGH (ref 70–99)
Glucose-Capillary: 118 mg/dL — ABNORMAL HIGH (ref 70–99)

## 2014-08-23 LAB — PROTIME-INR
INR: 1.15 (ref 0.00–1.49)
PROTHROMBIN TIME: 14.8 s (ref 11.6–15.2)

## 2014-08-23 LAB — LACTIC ACID, PLASMA: LACTIC ACID, VENOUS: 0.8 mmol/L (ref 0.5–2.0)

## 2014-08-23 MED ORDER — HYOSCYAMINE SULFATE ER 0.375 MG PO TB12
0.3750 mg | ORAL_TABLET | Freq: Two times a day (BID) | ORAL | Status: DC
  Administered 2014-08-23 – 2014-08-24 (×3): 0.375 mg via ORAL
  Filled 2014-08-23 (×4): qty 1

## 2014-08-23 MED ORDER — LATANOPROST 0.005 % OP SOLN
1.0000 [drp] | Freq: Every day | OPHTHALMIC | Status: DC
  Administered 2014-08-23 – 2014-08-24 (×2): 1 [drp] via OPHTHALMIC
  Filled 2014-08-23 (×2): qty 2.5

## 2014-08-23 MED ORDER — CLINDAMYCIN PHOSPHATE 600 MG/50ML IV SOLN
600.0000 mg | Freq: Three times a day (TID) | INTRAVENOUS | Status: DC
  Administered 2014-08-23 – 2014-08-25 (×7): 600 mg via INTRAVENOUS
  Filled 2014-08-23 (×10): qty 50

## 2014-08-23 MED ORDER — SODIUM CHLORIDE 0.9 % IV SOLN
INTRAVENOUS | Status: AC
  Administered 2014-08-23: 02:00:00 via INTRAVENOUS

## 2014-08-23 MED ORDER — FAMOTIDINE 10 MG PO TABS
10.0000 mg | ORAL_TABLET | Freq: Every day | ORAL | Status: DC
  Administered 2014-08-23 – 2014-08-25 (×3): 10 mg via ORAL
  Filled 2014-08-23 (×3): qty 1

## 2014-08-23 MED ORDER — METRONIDAZOLE IN NACL 5-0.79 MG/ML-% IV SOLN
500.0000 mg | Freq: Three times a day (TID) | INTRAVENOUS | Status: DC
  Administered 2014-08-23 – 2014-08-25 (×7): 500 mg via INTRAVENOUS
  Filled 2014-08-23 (×10): qty 100

## 2014-08-23 MED ORDER — TRAMADOL HCL 50 MG PO TABS
50.0000 mg | ORAL_TABLET | Freq: Four times a day (QID) | ORAL | Status: DC | PRN
  Administered 2014-08-23 – 2014-08-25 (×6): 50 mg via ORAL
  Filled 2014-08-23 (×6): qty 1

## 2014-08-23 MED ORDER — SODIUM CHLORIDE 0.9 % IV SOLN
INTRAVENOUS | Status: DC
  Administered 2014-08-23 – 2014-08-24 (×3): via INTRAVENOUS

## 2014-08-23 MED ORDER — INSULIN ASPART 100 UNIT/ML ~~LOC~~ SOLN
0.0000 [IU] | Freq: Three times a day (TID) | SUBCUTANEOUS | Status: DC
  Administered 2014-08-25: 2 [IU] via SUBCUTANEOUS

## 2014-08-23 MED ORDER — FENTANYL CITRATE 0.05 MG/ML IJ SOLN: 12.5000 ug | INTRAMUSCULAR | Status: DC | PRN

## 2014-08-23 MED ORDER — AMLODIPINE BESYLATE 10 MG PO TABS
10.0000 mg | ORAL_TABLET | Freq: Every day | ORAL | Status: DC
  Administered 2014-08-23 – 2014-08-25 (×2): 10 mg via ORAL
  Filled 2014-08-23 (×3): qty 1

## 2014-08-23 MED ORDER — ONDANSETRON HCL 4 MG/2ML IJ SOLN
4.0000 mg | Freq: Three times a day (TID) | INTRAMUSCULAR | Status: DC | PRN
  Administered 2014-08-23: 4 mg via INTRAVENOUS
  Filled 2014-08-23: qty 2

## 2014-08-23 MED ORDER — LISINOPRIL 10 MG PO TABS
10.0000 mg | ORAL_TABLET | Freq: Every day | ORAL | Status: DC
  Administered 2014-08-25: 10 mg via ORAL
  Filled 2014-08-23 (×3): qty 1

## 2014-08-23 MED ORDER — ACETAMINOPHEN 500 MG PO TABS: 500.0000 mg | ORAL_TABLET | Freq: Four times a day (QID) | ORAL | Status: DC | PRN

## 2014-08-23 MED ORDER — MORPHINE SULFATE 2 MG/ML IJ SOLN: 2.0000 mg | INTRAMUSCULAR | Status: DC | PRN

## 2014-08-23 MED ORDER — CLOPIDOGREL BISULFATE 75 MG PO TABS
75.0000 mg | ORAL_TABLET | Freq: Every day | ORAL | Status: DC
  Administered 2014-08-24: 75 mg via ORAL
  Filled 2014-08-23 (×3): qty 1

## 2014-08-23 NOTE — Evaluation (Signed)
Physical Therapy Evaluation Patient Details Name: Kathleen Oliver MRN: [id number] DOB: February 21, 1946 Today's Date: 08/23/2014   History of Present Illness  Patient is a 69 y/o female admitted with abdominal pain. PMH of hypertension, GERD, diabetes mellitus and CAD post suspicious of a stent placement. CT abdomen/pelvis- descending colitis.     Clinical Impression  Patient presents with mild weakness secondary to diarrhea and hospitalization. Tolerated ambulation with occasional use of rail for support however no LOB. Encourage ambulation multiple times per day with family or Engineer, manufacturing for safety. Tolerated stair negotiation without difficulty. Anticipate strength and mobility will improve with increased activity and when pt is allow to eat solid foods. Pt functioning close to baseline and does not require further skilled therapy services just encourage mobility with supervision. Discharge from therapy.    Follow Up Recommendations No PT follow up;Supervision - Intermittent    Equipment Recommendations  None recommended by PT    Recommendations for Other Services       Precautions / Restrictions Precautions Precautions: None Restrictions Weight Bearing Restrictions: No      Mobility  Bed Mobility Overal bed mobility: Needs Assistance Bed Mobility: Supine to Sit     Supine to sit: Modified independent (Device/Increase time)        Transfers Overall transfer level: Needs assistance Equipment used: None Transfers: Sit to/from Stand Sit to Stand: Supervision         General transfer comment: Supervision for safety.   Ambulation/Gait Ambulation/Gait assistance: Supervision Ambulation Distance (Feet): 200 Feet Assistive device: None Gait Pattern/deviations: Step-through pattern;Decreased stride length;Narrow base of support   Gait velocity interpretation: Below normal speed for age/gender General Gait Details: Pt with slow, mildly unsteady gait which improved with  increased distance. Occasionally holding onto rail for support.- reports as baseline  Stairs Stairs: Yes Stairs assistance: Modified independent (Device/Increase time) Stair Management: One rail Left Number of Stairs: 4    Wheelchair Mobility    Modified Rankin (Stroke Patients Only)       Balance Overall balance assessment: Needs assistance Sitting-balance support: Feet supported;No upper extremity supported Sitting balance-Leahy Scale: Normal     Standing balance support: During functional activity Standing balance-Leahy Scale: Good                               Pertinent Vitals/Pain Pain Assessment: No/denies pain ("just cramping in stomach")    Home Living Family/patient expects to be discharged to:: Private residence Living Arrangements: Alone Available Help at Discharge: Family;Available PRN/intermittently Type of Home: House Home Access: Stairs to enter Entrance Stairs-Rails: Right Entrance Stairs-Number of Steps: 4 Home Layout: One level Home Equipment: Cane - single point      Prior Function Level of Independence: Independent         Comments: Pt reports no falls. Helps drive friends to appts.     Hand Dominance        Extremity/Trunk Assessment   Upper Extremity Assessment: Overall WFL for tasks assessed           Lower Extremity Assessment: Generalized weakness         Communication   Communication: No difficulties  Cognition Arousal/Alertness: Awake/alert Behavior During Therapy: WFL for tasks assessed/performed Overall Cognitive Status: Within Functional Limits for tasks assessed                      General Comments      Exercises  Assessment/Plan    PT Assessment Patent does not need any further PT services  PT Diagnosis     PT Problem List    PT Treatment Interventions     PT Goals (Current goals can be found in the Care Plan section) Acute Rehab PT Goals Patient Stated Goal: to be  able to eat something PT Goal Formulation: With patient Time For Goal Achievement: 09/06/14 Potential to Achieve Goals: Good    Frequency     Barriers to discharge        Co-evaluation               End of Session Equipment Utilized During Treatment: Gait belt Activity Tolerance: Patient tolerated treatment well Patient left: in bed;with call bell/phone within reach;with family/visitor present (sitting EOB.) Nurse Communication: Mobility status         Time: 9024-0973 PT Time Calculation (min) (ACUTE ONLY): 14 min   Charges:   PT Evaluation $Initial PT Evaluation Tier I: 1 Procedure     PT G CodesCandy Sledge A 22-Sep-2014, 9:38 AM Candy Sledge, [place], DPT [phone]

## 2014-08-23 NOTE — Evaluation (Signed)
Occupational Therapy Evaluation Patient Details Name: Kathleen Oliver MRN: [id number] DOB: 1946-03-23 Today's Date: 08/23/2014    History of Present Illness Patient is a 69 y/o female admitted with abdominal pain. PMH of hypertension, GERD, diabetes mellitus and CAD post suspicious of a stent placement. CT abdomen/pelvis- descending colitis.    Clinical Impression   PTA pt lived at home alone and was independent with ADLs and IADLs. Pt currently at Mod I level due to generalized weakness. Pt educated on fall prevention and energy conservation. No further acute OT needs. Pt is hopeful to d/c home tomorrow.      Follow Up Recommendations  No OT follow up    Equipment Recommendations  None recommended by OT    Recommendations for Other Services       Precautions / Restrictions Restrictions Weight Bearing Restrictions: No      Mobility Bed Mobility               General bed mobility comments: Pt sitting EOB when OT arrived.   Transfers Overall transfer level: Modified independent                    Balance Overall balance assessment: No apparent balance deficits (not formally assessed)                                          ADL Overall ADL's : Modified independent                                       General ADL Comments: Pt at mod I with ADLs due to LE weakness. Educated pt on fall prevention and energy conservation and demonstrated UE exercises/activities she can perform in sitting.      Vision Additional Comments: Pt reports no change from baseline          Pertinent Vitals/Pain Pain Assessment: No/denies pain     Hand Dominance     Extremity/Trunk Assessment Upper Extremity Assessment Upper Extremity Assessment: Overall WFL for tasks assessed   Lower Extremity Assessment Lower Extremity Assessment: Generalized weakness   Cervical / Trunk Assessment Cervical / Trunk Assessment: Normal    Communication Communication Communication: No difficulties   Cognition Arousal/Alertness: Awake/alert Behavior During Therapy: WFL for tasks assessed/performed Overall Cognitive Status: Within Functional Limits for tasks assessed                                Home Living Family/patient expects to be discharged to:: Private residence Living Arrangements: Alone Available Help at Discharge: Family;Available PRN/intermittently Type of Home: House Home Access: Stairs to enter [employer] of Steps: 4 Entrance Stairs-Rails: Right Home Layout: One level     Bathroom Shower/Tub: Tub/shower unit Shower/tub characteristics: Curtain Biochemist, clinical: Standard     Home Equipment: Cane - single point;Shower seat          Prior Functioning/Environment Level of Independence: Independent        Comments: Pt reports no falls. Helps drive friends to appts.    OT Diagnosis: Generalized weakness;Acute pain    End of Session Equipment Utilized During Treatment: Other (comment) (IV pole (pt pushed, but not needed)) Nurse Communication: Mobility status  Activity Tolerance: Patient tolerated treatment well Patient left:  in chair;with call bell/phone within reach   Time: 1542-1556 OT Time Calculation (min): 14 min Charges:  OT General Charges $OT Visit: 1 Procedure OT Evaluation $Initial OT Evaluation Tier I: 1 Procedure G-Codes:    Juluis Rainier 2014-09-22, 4:09 PM  Cyndie Chime, OTR/L Occupational Therapist [phone] (pager)

## 2014-08-23 NOTE — Progress Notes (Signed)
Patient ID: Kathleen Oliver, female   DOB: October 15, 1945, 69 y.o.   MRN: [id number] [facility] PROGRESS NOTE  Kathleen Oliver EZM:[id number] DOB: 1945-08-31 DOA: 08/22/2014 PCP: Mathews Argyle, MD  Brief narrative:     Addendum to admission note done 08/23/2014. 69 y.o. female with past medical history of hypertension, GERD, diabetes mellitus, coronary artery disease status post stent placement who presented to [facility] ED with worsening left-sided abdominal pain as well as lower abdominal pain associated with bloody diarrhea, nausea but no vomiting. Patient did not have associated fevers or chills. Patient reported recent change in metformin dosage which cause her to have more diarrhea.  On admission, patient was hemodynamically stable. CT abdomen showed descending colitis. She was started on clindamycin and Flagyl because patient apparently has allergies to fluoroquinolones. o leukocytosis, temperature normal, no tachycardia, electrolytes okay. Patient is admitted to inpatient for further evaluation and treatment.   Assessment/Plan:    Principal Problem: Abdominal pain  / Descending colitis / leukocytosis - Patient presented with abdominal pain and bloody diarrhea. She was found to have descending colitis on CT abdomen. - Patient started on clindamycin and Flagyl as indicated above. - Continue supportive care with IV fluids and analgesia as needed. - Diet advanced to clear liquid today. - Added Levbid twice daily to see if it can help with abdominal cramping.  Active problems: Bloody diarrhea - No further episodes of bleeding noted. Likely secondary to colitis. - Follow-up on stool for C. difficile and viral GI panel  History of CAD status post stent placement - Continue Plavix  Essential hypertension - Continue Norvasc 10 mg daily and lisinopril 10 mg daily  Diabetes mellitus, no complications - Metformin on hold. Continue insulin sliding scale until by mouth intake  better.   DVT Prophylaxis  - SCDs bilaterally due to risk of bleeding.   Code Status: Full.  Family Communication:  plan of care discussed with the patient Disposition Plan: Patient is on IV antibiotics for colitis, currently nothing by mouth but will advance diet to clear liquid today. Not ready for discharge at this time.  IV access:  Peripheral IV  Procedures and diagnostic studies:    Ct Abdomen Pelvis W Contrast 08/22/2014   1. Descending colitis. Differential considerations are enteric infection, inflammatory bowel disease, or ischemic colitis. 2. Cholecystectomy and hysterectomy. 3. LEFT renal sinus cysts. 4. Inferior abdominal wall herniorrhaphy.    Medical Consultants:  None   Other Consultants:  None   IAnti-Infectives:   Clindamycin 08/23/2014 --> Flagyl 08/23/2014 -->   Leisa Lenz, MD  [facility] Pager [phone]  If 7PM-7AM, please contact night-coverage [email or web address] Password [email or web address] 08/23/2014, 11:06 AM   LOS: 0 days    HPI/Subjective: No acute overnight events.  Objective: Filed Vitals:   08/22/14 2345 08/23/14 0015 08/23/14 0139 08/23/14 0636  BP: 114/55 115/58 112/40 106/49  Pulse: 65 67 66 64  Temp:   98.6 F (37 C) 98.3 F (36.8 C)  TempSrc:   Oral Oral  Resp: 15 15 16 16   Height:   5\' 1"  (1.549 m)   Weight:   83.2 kg (183 lb 6.8 oz)   SpO2: 92% 96% 92% 91%    Intake/Output Summary (Last 24 hours) at 08/23/14 1106 Last data filed at 08/23/14 1005  Gross per 24 hour  Intake    447 ml  Output      0 ml  Net    447 ml    Exam:   General:  Pt is  alert, follows commands appropriately, not in acute distress  Cardiovascular: Regular rate and rhythm, S1/S2, no murmurs  Respiratory: Clear to auscultation bilaterally, no wheezing, no crackles, no rhonchi  Abdomen: tender on left side and in lower abdomen, no rebound tenderness, non distended, bowel sounds present  Extremities: No edema, pulses DP and PT palpable  bilaterally  Neuro: Grossly nonfocal  Data Reviewed: Basic Metabolic Panel:  Recent Labs Lab 08/22/14 2036 08/23/14 0238  NA 134* 141  K 3.7 3.7  CL 98 104  CO2 23 28  GLUCOSE 126* 131*  BUN 10 7  CREATININE 0.83 0.80  CALCIUM 9.1 8.4   Liver Function Tests:  Recent Labs Lab 08/22/14 2036 08/23/14 0238  AST 20 134*  ALT 18 74*  ALKPHOS 66 92  BILITOT 0.4 0.9  PROT 7.0 6.1  ALBUMIN 3.8 3.0*   No results for input(s): LIPASE, AMYLASE in the last 168 hours. No results for input(s): AMMONIA in the last 168 hours. CBC:  Recent Labs Lab 08/22/14 2036 08/23/14 0238  WBC 10.8* 9.0  NEUTROABS 8.0*  --   HGB 12.2 10.5*  HCT 37.1 32.3*  MCV 89.0 89.2  PLT 285 230   Cardiac Enzymes: No results for input(s): CKTOTAL, CKMB, CKMBINDEX, TROPONINI in the last 168 hours. BNP: Invalid input(s): POCBNP CBG:  Recent Labs Lab 08/23/14 0748  GLUCAP 140*    No results found for this or any previous visit (from the past 240 hour(s)).   Scheduled Meds: . sodium chloride   Intravenous STAT  . amLODipine  10 mg Oral Daily  . clindamycin (CLEOCIN) IV  600 mg Intravenous 3 times per day  . clopidogrel  75 mg Oral Q breakfast  . famotidine  10 mg Oral Daily  . insulin aspart  0-9 Units Subcutaneous TID WC  . latanoprost  1 drop Both Eyes QHS  . lisinopril  10 mg Oral Daily  . metronidazole  500 mg Intravenous Q8H   Continuous Infusions: . sodium chloride 100 mL/hr at 08/23/14 0201

## 2014-08-23 NOTE — H&P (Signed)
[facility] History and Physical  Kathleen Oliver OVF:[id number] DOB: 06-10-1946 DOA: 08/22/2014  Referring physician: ED physician PCP: Mathews Argyle, MD  Specialists:   Chief Complaint: abdominal pain  HPI: Kathleen Oliver is a 69 y.o. female with past medical history of hypertension, GERD, diabetes mellitus, coronary artery disease, post suspicious of a stent placement, who presents with abdominal pain.  Patient reports that her abdominal pain started yesterday. It is located in the left side of abdomen. It is associated with bloody diarrhea, nausea, but no vomiting. Patient does not have history of recent antibiotics use. She reports that she has a chronic diarrhea secondary to metformin use. Her diarrhea improved after her metformin dose was decreased from 750-500 daily. She reports that her abdominal pain is new, and her bloody diarrhea is also new.   Patient denies fever, chills, headaches, cough, chest pain, SOB, dysuria, urgency, frequency, hematuria, skin rashes or leg swelling. No unilateral weakness, numbness or tingling sensations. No vision change or hearing loss.  In ED, patient was found to have descending colitis by CT abdomen/pelvis. No leukocytosis, temperature normal, no tachycardia, electrolytes okay. Patient is admitted to inpatient for further evaluation and treatment.  Review of Systems: As presented in the history of presenting illness, rest negative.  Where does patient live?  At home Can patient participate in ADLs? Yes  Allergy:  Allergies  Allergen Reactions  . Cinobac [Cinoxacin]     hives  . Codeine Itching  . Penicillins     Hives     Past Medical History  Diagnosis Date  . GERD (gastroesophageal reflux disease)   . Diabetes mellitus without complication   . Hypertension   . Hypercholesteremia   . Obesity   . HTN (hypertension)   . Diabetes   . Anxiety   . Osteoarthrosis     Past Surgical History  Procedure Laterality Date  .  Coronary angioplasty with stent placement    . Esophageal dilation    . Bladder suspension      Social History:  reports that she quit smoking about 32 years ago. Her smoking use included Cigarettes. She has never used smokeless tobacco. She reports that she does not drink alcohol. Her drug history is not on file.  Family History:  Family History  Problem Relation Age of Onset  . Diverticulitis Mother   . Hypertension Mother   . Heart attack Father   . Heart attack Brother   . Diabetes Brother   . Hypertension Sister      Prior to Admission medications   Medication Sig Start Date End Date Taking? Authorizing Provider  acetaminophen (TYLENOL) 500 MG tablet Take 500 mg by mouth every 6 (six) hours as needed for headache.   Yes Historical Provider, MD  amLODipine (NORVASC) 10 MG tablet Take 10 mg by mouth daily.   Yes Historical Provider, MD  clopidogrel (PLAVIX) 75 MG tablet Take 75 mg by mouth daily with breakfast.   Yes Historical Provider, MD  famotidine (PEPCID) 10 MG tablet Take 10 mg by mouth daily.   Yes Historical Provider, MD  hydrochlorothiazide (HYDRODIURIL) 25 MG tablet Take 1 tablet (25 mg total) by mouth daily. 07/11/13  Yes Jettie Booze, MD  latanoprost (XALATAN) 0.005 % ophthalmic solution Place 1 drop into both eyes at bedtime.  05/15/13  Yes Historical Provider, MD  lisinopril (PRINIVIL,ZESTRIL) 10 MG tablet Take 10 mg by mouth daily.   Yes Historical Provider, MD  loperamide (IMODIUM) 1 MG/5ML solution Take 2  mg by mouth as needed for diarrhea or loose stools.   Yes Historical Provider, MD  metFORMIN (GLUCOPHAGE-XR) 500 MG 24 hr tablet Take 500 mg by mouth daily. 08/14/14  Yes Historical Provider, MD  Simethicone (GAS-X PO) Take 1 capsule by mouth daily as needed (abdominal pain).   Yes Historical Provider, MD  sitaGLIPtin (JANUVIA) 100 MG tablet Take 100 mg by mouth daily.   Yes Historical Provider, MD  traMADol (ULTRAM) 50 MG tablet Take 50 mg by mouth every 6  (six) hours as needed. For hip pain   Yes Historical Provider, MD  ciprofloxacin (CIPRO) 500 MG tablet Take 500 mg by mouth 2 (two) times daily.  08/22/14   Historical Provider, MD  metroNIDAZOLE (FLAGYL) 500 MG tablet Take 500 mg by mouth 3 (three) times daily. 08/22/14   Historical Provider, MD    Physical Exam: Filed Vitals:   08/22/14 2315 08/22/14 2345 08/23/14 0015 08/23/14 0139  BP: 124/54 114/55 115/58 112/40  Pulse: 70 65 67 66  Temp:    98.6 F (37 C)  TempSrc:    Oral  Resp: 13 15 15 16   Height:    5\' 1"  (1.549 m)  Weight:    83.2 kg (183 lb 6.8 oz)  SpO2: 94% 92% 96% 92%   General: Not in acute distress HEENT:       Eyes: PERRL, EOMI, no scleral icterus       ENT: No discharge from the ears and nose, no pharynx injection, no tonsillar enlargement.        Neck: No JVD, no bruit, no mass felt. Cardiac: S1/S2, RRR, No murmurs, No gallops or rubs Pulm: Good air movement bilaterally. Clear to auscultation bilaterally. No rales, wheezing, rhonchi or rubs. Abd: Soft, nondistended, tender over left side, no rebound pain, no organomegaly, BS present Ext: No edema bilaterally. 2+DP/PT pulse bilaterally Musculoskeletal: No joint deformities, erythema, or stiffness, ROM full Skin: No rashes.  Neuro: Alert and oriented X3, cranial nerves II-XII grossly intact, muscle strength 5/5 in all extremeties, sensation to light touch intact.  Psych: Patient is not psychotic, no suicidal or hemocidal ideation.  Labs on Admission:  Basic Metabolic Panel:  Recent Labs Lab 08/22/14 2036 08/23/14 0238  NA 134* 141  K 3.7 3.7  CL 98 104  CO2 23 28  GLUCOSE 126* 131*  BUN 10 7  CREATININE 0.83 0.80  CALCIUM 9.1 8.4   Liver Function Tests:  Recent Labs Lab 08/22/14 2036 08/23/14 0238  AST 20 134*  ALT 18 74*  ALKPHOS 66 92  BILITOT 0.4 0.9  PROT 7.0 6.1  ALBUMIN 3.8 3.0*   No results for input(s): LIPASE, AMYLASE in the last 168 hours. No results for input(s): AMMONIA in the  last 168 hours. CBC:  Recent Labs Lab 08/22/14 2036 08/23/14 0238  WBC 10.8* 9.0  NEUTROABS 8.0*  --   HGB 12.2 10.5*  HCT 37.1 32.3*  MCV 89.0 89.2  PLT 285 230   Cardiac Enzymes: No results for input(s): CKTOTAL, CKMB, CKMBINDEX, TROPONINI in the last 168 hours.  BNP (last 3 results) No results for input(s): BNP in the last 8760 hours.  ProBNP (last 3 results) No results for input(s): PROBNP in the last 8760 hours.  CBG: No results for input(s): GLUCAP in the last 168 hours.  Radiological Exams on Admission: Ct Abdomen Pelvis W Contrast  08/22/2014   CLINICAL DATA:  Left-sided abdominal pain. Rectal bleeding. Diverticulitis.  EXAM: CT ABDOMEN AND PELVIS WITH CONTRAST  TECHNIQUE: Multidetector CT imaging of the abdomen and pelvis was performed using the standard protocol following bolus administration of intravenous contrast.  CONTRAST:  153mL OMNIPAQUE IOHEXOL 300 MG/ML  SOLN  COMPARISON:  CT 07/27/2006.  FINDINGS: Musculoskeletal: Lumbar spondylosis. No aggressive osseous lesions. Bilateral hip osteoarthritis incidentally noted.  Lung Bases: Atelectasis.  Liver: Normal. Hypervascular anterior RIGHT hepatic lobe liver lesion seen on the prior exam is not visible, probably due to phase of contrast.  Spleen:  Normal.  Gallbladder:  Cholecystectomy.  Common bile duct:  Normal.  Pancreas:  Normal.  Adrenal glands:  Normal bilaterally.  Kidneys: LEFT renal sinus cysts. RIGHT kidney appears normal. Normal enhancement and delayed excretion of contrast. Both ureters are within normal limits.  Stomach:  Small hiatal hernia.  Proximal stomach is collapsed.  Small bowel: Duodenum appears normal. There is no small bowel obstruction. No small bowel inflammatory changes.  Colon: The ileocecal valve is in a protuberant lower abdomen in the midline. Ascending colon is within normal limits. Redundant transverse colon. At the splenic flexure the colon, there is mural thickening and pericolonic  inflammatory changes. This extends through the descending colon. Minimal involvement of the rectosigmoid. Diverticulosis is present without diverticulitis.  Pelvic Genitourinary: Hysterectomy. Urinary bladder is partially decompressed.  Peritoneum: No intra-abdominal free air.  No free fluid.  Vasculature: Mild atherosclerosis. No acute vascular abnormality. Mesenteric vessels appear to opacified normally.  Body Wall: Lax anterior abdominal wall with rectus atrophy. Lower abdominal wall herniorrhaphy without recurrent hernia. No change in the lower abdominal wall compared to prior most superficial abdominal wall, with interposition of fat between the and the native abdominal wall.  IMPRESSION: 1. Descending colitis. Differential considerations are enteric infection, inflammatory bowel disease, or ischemic colitis. 2. Cholecystectomy and hysterectomy. 3. LEFT renal sinus cysts. 4. Inferior abdominal wall herniorrhaphy.   Electronically Signed   By: Dereck Ligas M.D.   On: 08/22/2014 17:38    Assessment/Plan Principal Problem:   Colitis Active Problems:   GERD (gastroesophageal reflux disease)   Diabetes mellitus without complication   HTN (hypertension)   CAD (coronary artery disease)  Colitis: as evidenced by CT scan. Potential differential diagnosis includes ischemic colitis and infectious colitis. Currently patient is hemodynamically stable. Not obviously septic. Electrolytes okay.  -Will admitted to [place] bed -Check C. Difficile PCR, GI pathogen panel -Check lactic acid level  -blood culture -start IV Flagyl -Patient is allergic to [place] which which belongs to quinolone class of antibiotics. Patient is not a good candidate for Cipro. ED physician discussed with pharmacist, who suggested to put patient on IV clindamycin, which was started by ED, will continue. -when necessary morphine for pain, when necessary Zofran for nausea -may get GI consult if no  improvement.  GERD: -Pepcid  Diabetes mellitus: Patient is on metformin and Januvia at home. no A1c on record -Switched to sliding scale insulin -Check A1c  coronary artery disease: Patient is on Plavix at home. Now with the bloody diarrhea. Since patient hemoglobin is stable and her bloody diarrhea is with small amount blood, will continue Plavix now. -Continue Plavix now, but will discontinue if bloody diarrhea getting worse.  Hypertension: -Continue amlodipine and lisinopril -code HCTZ since patient needs IV fluid   DVT ppx: SCD  Code Status: Full code Family Communication:   Yes, patient's  daguhter     at bed side Disposition Plan: Admit to inpatient   Date of Service 08/23/2014    Ivor Costa [facility] Pager [phone]  If 7PM-7AM, please  contact night-coverage [email or web address] Password [email or web address] 08/23/2014, 6:18 AM

## 2014-08-24 DIAGNOSIS — D62 Acute posthemorrhagic anemia: Secondary | ICD-10-CM

## 2014-08-24 LAB — GLUCOSE, CAPILLARY
GLUCOSE-CAPILLARY: 136 mg/dL — AB (ref 70–99)
Glucose-Capillary: 104 mg/dL — ABNORMAL HIGH (ref 70–99)
Glucose-Capillary: 140 mg/dL — ABNORMAL HIGH (ref 70–99)
Glucose-Capillary: 198 mg/dL — ABNORMAL HIGH (ref 70–99)

## 2014-08-24 LAB — HEMOGLOBIN AND HEMATOCRIT, BLOOD
HCT: 31 % — ABNORMAL LOW (ref 36.0–46.0)
Hemoglobin: 10.2 g/dL — ABNORMAL LOW (ref 12.0–15.0)

## 2014-08-24 LAB — CLOSTRIDIUM DIFFICILE BY PCR: CDIFFPCR: NEGATIVE

## 2014-08-24 LAB — HEMOGLOBIN A1C
Hgb A1c MFr Bld: 7.3 % — ABNORMAL HIGH (ref 4.8–5.6)
MEAN PLASMA GLUCOSE: 163 mg/dL

## 2014-08-24 MED ORDER — POLYETHYLENE GLYCOL 3350 17 G PO PACK
17.0000 g | PACK | Freq: Every day | ORAL | Status: DC
  Administered 2014-08-24: 17 g via ORAL
  Filled 2014-08-24 (×2): qty 1

## 2014-08-24 MED ORDER — SENNOSIDES-DOCUSATE SODIUM 8.6-50 MG PO TABS
1.0000 | ORAL_TABLET | Freq: Two times a day (BID) | ORAL | Status: DC
  Administered 2014-08-24: 1 via ORAL
  Filled 2014-08-24 (×3): qty 1

## 2014-08-24 NOTE — Progress Notes (Signed)
[facility] PROGRESS NOTE  Kathleen Oliver EXB:[id number] DOB: Feb 06, 1946 DOA: 08/22/2014 PCP: Mathews Argyle, MD  Assessment/Plan: 69 y/o female with PMH of HTN, GERD, DM, CAD status post stent placement, prior diverticulitis s/p colectomy who presented to [facility] ED with worsening left-sided abdominal pain as well as lower abdominal pain associated with bloody diarrhea, nausea but no vomiting. CT abdomen showed descending colitis. She was started on clindamycin and Flagyl because patient apparently has allergies to fluoroquinolones.  1. Descending colitis / leukocytosis: suspected infectious vs ischemic colitis; -diarrhea resolved, no hematochezia, afebrile, leukocytosis is resolved and abdominal pain improved; currently she reports constipation; will cont clindamycin and Flagyl, add bowel regimen, advance diet; d/w patient, she agreed to f/u with GI for colonoscopy in evaluation as outpatient in 2-4 weeks   2. History of CAD status post stent placement;  Continue Plavix 3. Essential hypertension; Continue Norvasc 10 mg daily and lisinopril 10 mg daily 4. Diabetes mellitus, NU2V-2.5; no complications; Metformin on hold. Continue insulin sliding scale until by mouth intake better 5. Mild acute blood loss anemia due to GIB, no new episodes of bleeding; recheck Hg in AM   Code Status: full Family Communication: d/w patient (indicate person spoken with, relationship, and if by phone, the number) Disposition Plan: home 24-48 hrs    Consultants:  none  Procedures:  none  Antibiotics: Clindamycin 08/23/2014 --> Flagyl 08/23/2014 -->     (indicate start date, and stop date if known)  HPI/Subjective: alert  Objective: Filed Vitals:   08/24/14 0523  BP: 115/51  Pulse: 61  Temp: 98.1 F (36.7 C)  Resp: 16    Intake/Output Summary (Last 24 hours) at 08/24/14 1122 Last data filed at 08/24/14 0900  Gross per 24 hour  Intake 4635.67 ml  Output      0 ml  Net 4635.67 ml    Filed Weights   08/23/14 0139  Weight: 83.2 kg (183 lb 6.8 oz)    Exam:   General:  alert  Cardiovascular: s1,s2 rrr  Respiratory: CTA BL  Abdomen: soft, mild L sided tender, no rebound   Musculoskeletal: no leg edema   Data Reviewed: Basic Metabolic Panel:  Recent Labs Lab 08/22/14 2036 08/23/14 0238  NA 134* 141  K 3.7 3.7  CL 98 104  CO2 23 28  GLUCOSE 126* 131*  BUN 10 7  CREATININE 0.83 0.80  CALCIUM 9.1 8.4   Liver Function Tests:  Recent Labs Lab 08/22/14 2036 08/23/14 0238  AST 20 134*  ALT 18 74*  ALKPHOS 66 92  BILITOT 0.4 0.9  PROT 7.0 6.1  ALBUMIN 3.8 3.0*   No results for input(s): LIPASE, AMYLASE in the last 168 hours. No results for input(s): AMMONIA in the last 168 hours. CBC:  Recent Labs Lab 08/22/14 2036 08/23/14 0238  WBC 10.8* 9.0  NEUTROABS 8.0*  --   HGB 12.2 10.5*  HCT 37.1 32.3*  MCV 89.0 89.2  PLT 285 230   Cardiac Enzymes: No results for input(s): CKTOTAL, CKMB, CKMBINDEX, TROPONINI in the last 168 hours. BNP (last 3 results) No results for input(s): BNP in the last 8760 hours.  ProBNP (last 3 results) No results for input(s): PROBNP in the last 8760 hours.  CBG:  Recent Labs Lab 08/23/14 0748 08/23/14 1126 08/23/14 1727 08/23/14 2131 08/24/14 0754  GLUCAP 140* 118* 106* 118* 136*    Recent Results (from the past 240 hour(s))  Culture, blood (routine x 2)     Status: None (Preliminary result)  Collection Time: 08/23/14  2:38 AM  Result Value Ref Range Status   Specimen Description BLOOD RIGHT ANTECUBITAL  Final   Special Requests BOTTLES DRAWN AEROBIC AND ANAEROBIC 5CC EACH  Final   Culture   Final           BLOOD CULTURE RECEIVED NO GROWTH TO DATE CULTURE WILL BE HELD FOR 5 DAYS BEFORE ISSUING A FINAL NEGATIVE REPORT Performed at [facility]    Report Status PENDING  Incomplete  Culture, blood (routine x 2)     Status: None (Preliminary result)   Collection Time: 08/23/14  2:49 AM   Result Value Ref Range Status   Specimen Description BLOOD BLOOD RIGHT FOREARM  Final   Special Requests   Final    BOTTLES DRAWN AEROBIC AND ANAEROBIC 5CC AER 4CC ANA   Culture   Final           BLOOD CULTURE RECEIVED NO GROWTH TO DATE CULTURE WILL BE HELD FOR 5 DAYS BEFORE ISSUING A FINAL NEGATIVE REPORT Performed at [facility]    Report Status PENDING  Incomplete     Studies: Ct Abdomen Pelvis W Contrast  08/22/2014   CLINICAL DATA:  Left-sided abdominal pain. Rectal bleeding. Diverticulitis.  EXAM: CT ABDOMEN AND PELVIS WITH CONTRAST  TECHNIQUE: Multidetector CT imaging of the abdomen and pelvis was performed using the standard protocol following bolus administration of intravenous contrast.  CONTRAST:  164mL OMNIPAQUE IOHEXOL 300 MG/ML  SOLN  COMPARISON:  CT 07/27/2006.  FINDINGS: Musculoskeletal: Lumbar spondylosis. No aggressive osseous lesions. Bilateral hip osteoarthritis incidentally noted.  Lung Bases: Atelectasis.  Liver: Normal. Hypervascular anterior RIGHT hepatic lobe liver lesion seen on the prior exam is not visible, probably due to phase of contrast.  Spleen:  Normal.  Gallbladder:  Cholecystectomy.  Common bile duct:  Normal.  Pancreas:  Normal.  Adrenal glands:  Normal bilaterally.  Kidneys: LEFT renal sinus cysts. RIGHT kidney appears normal. Normal enhancement and delayed excretion of contrast. Both ureters are within normal limits.  Stomach:  Small hiatal hernia.  Proximal stomach is collapsed.  Small bowel: Duodenum appears normal. There is no small bowel obstruction. No small bowel inflammatory changes.  Colon: The ileocecal valve is in a protuberant lower abdomen in the midline. Ascending colon is within normal limits. Redundant transverse colon. At the splenic flexure the colon, there is mural thickening and pericolonic inflammatory changes. This extends through the descending colon. Minimal involvement of the rectosigmoid. Diverticulosis is present without  diverticulitis.  Pelvic Genitourinary: Hysterectomy. Urinary bladder is partially decompressed.  Peritoneum: No intra-abdominal free air.  No free fluid.  Vasculature: Mild atherosclerosis. No acute vascular abnormality. Mesenteric vessels appear to opacified normally.  Body Wall: Lax anterior abdominal wall with rectus atrophy. Lower abdominal wall herniorrhaphy without recurrent hernia. No change in the lower abdominal wall compared to prior most superficial abdominal wall, with interposition of fat between the and the native abdominal wall.  IMPRESSION: 1. Descending colitis. Differential considerations are enteric infection, inflammatory bowel disease, or ischemic colitis. 2. Cholecystectomy and hysterectomy. 3. LEFT renal sinus cysts. 4. Inferior abdominal wall herniorrhaphy.   Electronically Signed   By: Dereck Ligas M.D.   On: 08/22/2014 17:38    Scheduled Meds: . amLODipine  10 mg Oral Daily  . clindamycin (CLEOCIN) IV  600 mg Intravenous 3 times per day  . clopidogrel  75 mg Oral Q breakfast  . famotidine  10 mg Oral Daily  . hyoscyamine  0.375 mg Oral Q12H  .  insulin aspart  0-9 Units Subcutaneous TID WC  . latanoprost  1 drop Both Eyes QHS  . lisinopril  10 mg Oral Daily  . metronidazole  500 mg Intravenous Q8H   Continuous Infusions: . sodium chloride 100 mL/hr at 08/24/14 0604    Principal Problem:   Colitis Active Problems:   GERD (gastroesophageal reflux disease)   Diabetes mellitus without complication   HTN (hypertension)   CAD (coronary artery disease)    Time spent: >35 minutes     Kinnie Feil  [facility] Pager [phone]. If 7PM-7AM, please contact night-coverage at [email or web address], password [facility] 08/24/2014, 11:22 AM  LOS: 1 day

## 2014-08-25 LAB — CBC
HCT: 32.7 % — ABNORMAL LOW (ref 36.0–46.0)
HEMOGLOBIN: 10.8 g/dL — AB (ref 12.0–15.0)
MCH: 29.4 pg (ref 26.0–34.0)
MCHC: 33 g/dL (ref 30.0–36.0)
MCV: 89.1 fL (ref 78.0–100.0)
Platelets: 261 10*3/uL (ref 150–400)
RBC: 3.67 MIL/uL — ABNORMAL LOW (ref 3.87–5.11)
RDW: 14.6 % (ref 11.5–15.5)
WBC: 6.3 10*3/uL (ref 4.0–10.5)

## 2014-08-25 LAB — GLUCOSE, CAPILLARY: Glucose-Capillary: 160 mg/dL — ABNORMAL HIGH (ref 70–99)

## 2014-08-25 MED ORDER — CLINDAMYCIN HCL 300 MG PO CAPS: 300.0000 mg | ORAL_CAPSULE | Freq: Three times a day (TID) | ORAL | Status: DC

## 2014-08-25 MED ORDER — LACTO-BIFIDUS-600 PO CAPS: 1.0000 | ORAL_CAPSULE | Freq: Every day | ORAL | Status: DC

## 2014-08-25 MED ORDER — CALCIUM CARBONATE ANTACID 500 MG PO CHEW
1.0000 | CHEWABLE_TABLET | Freq: Four times a day (QID) | ORAL | Status: DC | PRN
  Administered 2014-08-25: 200 mg via ORAL

## 2014-08-25 MED ORDER — METRONIDAZOLE 500 MG PO TABS: 500.0000 mg | ORAL_TABLET | Freq: Three times a day (TID) | ORAL | Status: DC

## 2014-08-25 MED ORDER — ONDANSETRON 4 MG PO TBDP: 4.0000 mg | ORAL_TABLET | Freq: Three times a day (TID) | ORAL | Status: DC | PRN

## 2014-08-25 NOTE — Progress Notes (Addendum)
Discharge instructions gone over with patient. Home medications gone over. Prescriptions given. Follow up appointments to be made. Diet, activity, and signs and symptoms of worsening condition gone over and who to call. Patient verbalized understanding of instructions.

## 2014-08-25 NOTE — Discharge Summary (Signed)
Physician Discharge Summary  Kathleen Oliver:[id number] DOB: [birth year] DOA: 08/22/2014  PCP: Mathews Argyle, MD  Admit date: 08/22/2014 Discharge date: 08/25/2014  Time spent: >35 minutes  Recommendations for Outpatient Follow-up:  F/u with PCP in 1 week F/u with GI in 2 weeks   Discharge Diagnoses:  Principal Problem:   Colitis Active Problems:   GERD (gastroesophageal reflux disease)   Diabetes mellitus without complication   HTN (hypertension)   CAD (coronary artery disease)   Discharge Condition: stable   Diet recommendation: DM, low sodium   Filed Weights   08/23/14 0139  Weight: 83.2 kg (183 lb 6.8 oz)    History of present illness:  69 y/o female with PMH of HTN, GERD, DM, CAD status post stent placement, prior diverticulitis s/p colectomy who presented to [facility] ED with worsening left-sided abdominal pain as well as lower abdominal pain associated with bloody diarrhea, nausea but no vomiting. CT abdomen showed descending colitis. She was started on clindamycin and Flagyl because patient apparently has allergies to fluoroquinolones.  Hospital Course:  1. Descending colitis / leukocytosis: suspected infectious; GI panel pend, C diff neg; diarrhea resolved, no hematochezia, afebrile, leukocytosis has resolved and abdominal pain improved on IV atx;  -will cont clindamycin and Flagyl to complete outpatient treatment course; c diff tested, which is negative; d/w patient, she agreed to f/u with GI for colonoscopy in evaluation as outpatient in 2-4 weeks  2. History of CAD, but patient denies having any stent placement; will hold Plavix due to mild GIB; d/w patient, she will follow up with PCP in 1-2 weeks to reevaluate to restart plavix if needed 3. Essential hypertension; Continue Norvasc 10 mg daily and lisinopril 10 mg daily; hold hctz 4. Diabetes mellitus, DG3O-7.5; no complications; Metformin on hold due to diarrhea  5. Mild acute blood loss anemia due to GIB, no  new episodes of bleeding; recheck Hg is stable; d/w patient, to recheck with 1 week with PCP    Procedures:  none (i.e. Studies not automatically included, echos, thoracentesis, etc; not x-rays)  Consultations:  none  Discharge Exam: Filed Vitals:   08/25/14 0536  BP: 115/61  Pulse: 67  Temp: 98.2 F (36.8 C)  Resp: 17    General: alert Cardiovascular: s1,s2 rrr Respiratory: CTA BL  Discharge Instructions  Discharge Instructions    Diet - low sodium heart healthy    Complete by:  As directed      Discharge instructions    Complete by:  As directed   Please follow up with primary care doctor in 1 week Please follow up with gastroenterologist in 2 weeks     Increase activity slowly    Complete by:  As directed             Medication List    STOP taking these medications        ciprofloxacin 500 MG tablet  Commonly known as:  CIPRO     clopidogrel 75 MG tablet  Commonly known as:  PLAVIX     hydrochlorothiazide 25 MG tablet  Commonly known as:  HYDRODIURIL     loperamide 1 MG/5ML solution  Commonly known as:  IMODIUM     metFORMIN 500 MG 24 hr tablet  Commonly known as:  GLUCOPHAGE-XR      TAKE these medications        acetaminophen 500 MG tablet  Commonly known as:  TYLENOL  Take 500 mg by mouth every 6 (six) hours as needed for headache.  amLODipine 10 MG tablet  Commonly known as:  NORVASC  Take 10 mg by mouth daily.     clindamycin 300 MG capsule  Commonly known as:  CLEOCIN  Take 1 capsule (300 mg total) by mouth 3 (three) times daily.     famotidine 10 MG tablet  Commonly known as:  PEPCID  Take 10 mg by mouth daily.     GAS-X PO  Take 1 capsule by mouth daily as needed (abdominal pain).     LACTO-BIFIDUS-600 Caps  Take 1 capsule by mouth daily.     latanoprost 0.005 % ophthalmic solution  Commonly known as:  XALATAN  Place 1 drop into both eyes at bedtime.     lisinopril 10 MG tablet  Commonly known as:  PRINIVIL,ZESTRIL   Take 10 mg by mouth daily.     metroNIDAZOLE 500 MG tablet  Commonly known as:  FLAGYL  Take 1 tablet (500 mg total) by mouth 3 (three) times daily.     sitaGLIPtin 100 MG tablet  Commonly known as:  JANUVIA  Take 100 mg by mouth daily.     traMADol 50 MG tablet  Commonly known as:  ULTRAM  Take 50 mg by mouth every 6 (six) hours as needed. For hip pain       Allergies  Allergen Reactions  . Cinobac [Cinoxacin]     hives  . Codeine Itching  . Penicillins     Hives        Follow-up Information    Follow up with Mathews Argyle, MD. Schedule an appointment as soon as possible for a visit in 1 week.   Specialty:  Internal Medicine   Contact information:   [address] [place] [address] [phone]        The results of significant diagnostics from this hospitalization (including imaging, microbiology, ancillary and laboratory) are listed below for reference.    Significant Diagnostic Studies: Ct Abdomen Pelvis W Contrast  08/22/2014   CLINICAL DATA:  Left-sided abdominal pain. Rectal bleeding. Diverticulitis.  EXAM: CT ABDOMEN AND PELVIS WITH CONTRAST  TECHNIQUE: Multidetector CT imaging of the abdomen and pelvis was performed using the standard protocol following bolus administration of intravenous contrast.  CONTRAST:  165mL OMNIPAQUE IOHEXOL 300 MG/ML  SOLN  COMPARISON:  CT 07/27/2006.  FINDINGS: Musculoskeletal: Lumbar spondylosis. No aggressive osseous lesions. Bilateral hip osteoarthritis incidentally noted.  Lung Bases: Atelectasis.  Liver: Normal. Hypervascular anterior RIGHT hepatic lobe liver lesion seen on the prior exam is not visible, probably due to phase of contrast.  Spleen:  Normal.  Gallbladder:  Cholecystectomy.  Common bile duct:  Normal.  Pancreas:  Normal.  Adrenal glands:  Normal bilaterally.  Kidneys: LEFT renal sinus cysts. RIGHT kidney appears normal. Normal enhancement and delayed excretion of contrast. Both ureters are  within normal limits.  Stomach:  Small hiatal hernia.  Proximal stomach is collapsed.  Small bowel: Duodenum appears normal. There is no small bowel obstruction. No small bowel inflammatory changes.  Colon: The ileocecal valve is in a protuberant lower abdomen in the midline. Ascending colon is within normal limits. Redundant transverse colon. At the splenic flexure the colon, there is mural thickening and pericolonic inflammatory changes. This extends through the descending colon. Minimal involvement of the rectosigmoid. Diverticulosis is present without diverticulitis.  Pelvic Genitourinary: Hysterectomy. Urinary bladder is partially decompressed.  Peritoneum: No intra-abdominal free air.  No free fluid.  Vasculature: Mild atherosclerosis. No acute vascular abnormality. Mesenteric vessels appear to opacified  normally.  Body Wall: Lax anterior abdominal wall with rectus atrophy. Lower abdominal wall herniorrhaphy without recurrent hernia. No change in the lower abdominal wall compared to prior most superficial abdominal wall, with interposition of fat between the and the native abdominal wall.  IMPRESSION: 1. Descending colitis. Differential considerations are enteric infection, inflammatory bowel disease, or ischemic colitis. 2. Cholecystectomy and hysterectomy. 3. LEFT renal sinus cysts. 4. Inferior abdominal wall herniorrhaphy.   Electronically Signed   By: Dereck Ligas M.D.   On: 08/22/2014 17:38    Microbiology: Recent Results (from the past 240 hour(s))  Culture, blood (routine x 2)     Status: None (Preliminary result)   Collection Time: 08/23/14  2:38 AM  Result Value Ref Range Status   Specimen Description BLOOD RIGHT ANTECUBITAL  Final   Special Requests BOTTLES DRAWN AEROBIC AND ANAEROBIC 5CC EACH  Final   Culture   Final           BLOOD CULTURE RECEIVED NO GROWTH TO DATE CULTURE WILL BE HELD FOR 5 DAYS BEFORE ISSUING A FINAL NEGATIVE REPORT Performed at [facility]    Report  Status PENDING  Incomplete  Culture, blood (routine x 2)     Status: None (Preliminary result)   Collection Time: 08/23/14  2:49 AM  Result Value Ref Range Status   Specimen Description BLOOD BLOOD RIGHT FOREARM  Final   Special Requests   Final    BOTTLES DRAWN AEROBIC AND ANAEROBIC 5CC AER 4CC ANA   Culture   Final           BLOOD CULTURE RECEIVED NO GROWTH TO DATE CULTURE WILL BE HELD FOR 5 DAYS BEFORE ISSUING A FINAL NEGATIVE REPORT Performed at [facility]    Report Status PENDING  Incomplete  Clostridium Difficile by PCR     Status: None   Collection Time: 08/24/14  4:42 PM  Result Value Ref Range Status   C difficile by pcr NEGATIVE NEGATIVE Final     Labs: Basic Metabolic Panel:  Recent Labs Lab 08/22/14 2036 08/23/14 0238  NA 134* 141  K 3.7 3.7  CL 98 104  CO2 23 28  GLUCOSE 126* 131*  BUN 10 7  CREATININE 0.83 0.80  CALCIUM 9.1 8.4   Liver Function Tests:  Recent Labs Lab 08/22/14 2036 08/23/14 0238  AST 20 134*  ALT 18 74*  ALKPHOS 66 92  BILITOT 0.4 0.9  PROT 7.0 6.1  ALBUMIN 3.8 3.0*   No results for input(s): LIPASE, AMYLASE in the last 168 hours. No results for input(s): AMMONIA in the last 168 hours. CBC:  Recent Labs Lab 08/22/14 2036 08/23/14 0238 08/24/14 2226  WBC 10.8* 9.0  --   NEUTROABS 8.0*  --   --   HGB 12.2 10.5* 10.2*  HCT 37.1 32.3* 31.0*  MCV 89.0 89.2  --   PLT 285 230  --    Cardiac Enzymes: No results for input(s): CKTOTAL, CKMB, CKMBINDEX, TROPONINI in the last 168 hours. BNP: BNP (last 3 results) No results for input(s): BNP in the last 8760 hours.  ProBNP (last 3 results) No results for input(s): PROBNP in the last 8760 hours.  CBG:  Recent Labs Lab 08/24/14 0754 08/24/14 1257 08/24/14 1737 08/24/14 2151 08/25/14 0821  GLUCAP 136* 104* 140* 198* 160*       Signed:  Zaheer Wageman N  [facility] 08/25/2014, 10:37 AM

## 2014-08-27 LAB — GI PATHOGEN PANEL BY PCR, STOOL
C difficile toxin A/B: NOT DETECTED
CAMPYLOBACTER BY PCR: NOT DETECTED
CRYPTOSPORIDIUM BY PCR: NOT DETECTED
E COLI 0157 BY PCR: NOT DETECTED
E coli (ETEC) LT/ST: NOT DETECTED
E coli (STEC): NOT DETECTED
G LAMBLIA BY PCR: NOT DETECTED
NOROVIRUS G1/G2: NOT DETECTED
Rotavirus A by PCR: NOT DETECTED
SHIGELLA BY PCR: NOT DETECTED
Salmonella by PCR: NOT DETECTED

## 2014-08-29 DIAGNOSIS — E119 Type 2 diabetes mellitus without complications: Secondary | ICD-10-CM | POA: Diagnosis not present

## 2014-08-29 DIAGNOSIS — D62 Acute posthemorrhagic anemia: Secondary | ICD-10-CM | POA: Diagnosis not present

## 2014-08-29 DIAGNOSIS — Z09 Encounter for follow-up examination after completed treatment for conditions other than malignant neoplasm: Secondary | ICD-10-CM | POA: Diagnosis not present

## 2014-08-29 DIAGNOSIS — K529 Noninfective gastroenteritis and colitis, unspecified: Secondary | ICD-10-CM | POA: Diagnosis not present

## 2014-08-29 LAB — CULTURE, BLOOD (ROUTINE X 2)
CULTURE: NO GROWTH
Culture: NO GROWTH

## 2014-09-02 DIAGNOSIS — D62 Acute posthemorrhagic anemia: Secondary | ICD-10-CM | POA: Diagnosis not present

## 2014-10-02 ENCOUNTER — Other Ambulatory Visit: Payer: Self-pay | Admitting: Gastroenterology

## 2014-10-04 DIAGNOSIS — M546 Pain in thoracic spine: Secondary | ICD-10-CM | POA: Diagnosis not present

## 2014-10-11 ENCOUNTER — Other Ambulatory Visit: Payer: Self-pay | Admitting: Geriatric Medicine

## 2014-10-11 DIAGNOSIS — M545 Low back pain: Secondary | ICD-10-CM

## 2014-10-25 ENCOUNTER — Ambulatory Visit
Admission: RE | Admit: 2014-10-25 | Discharge: 2014-10-25 | Disposition: A | Discharge status: Home / Self Care | Payer: Commercial Managed Care - HMO | Source: Ambulatory Visit | Attending: Geriatric Medicine | Admitting: Geriatric Medicine

## 2014-10-25 DIAGNOSIS — M545 Low back pain: Secondary | ICD-10-CM

## 2014-10-25 DIAGNOSIS — M47817 Spondylosis without myelopathy or radiculopathy, lumbosacral region: Secondary | ICD-10-CM | POA: Diagnosis not present

## 2014-10-25 DIAGNOSIS — M5127 Other intervertebral disc displacement, lumbosacral region: Secondary | ICD-10-CM | POA: Diagnosis not present

## 2014-11-11 DIAGNOSIS — E119 Type 2 diabetes mellitus without complications: Secondary | ICD-10-CM | POA: Diagnosis not present

## 2014-11-11 DIAGNOSIS — H4011X1 Primary open-angle glaucoma, mild stage: Secondary | ICD-10-CM | POA: Diagnosis not present

## 2014-11-19 DIAGNOSIS — J309 Allergic rhinitis, unspecified: Secondary | ICD-10-CM | POA: Diagnosis not present

## 2014-11-26 ENCOUNTER — Encounter (HOSPITAL_COMMUNITY): Payer: Self-pay | Admitting: *Deleted

## 2014-11-28 DIAGNOSIS — J209 Acute bronchitis, unspecified: Secondary | ICD-10-CM | POA: Diagnosis not present

## 2014-12-03 ENCOUNTER — Ambulatory Visit (HOSPITAL_COMMUNITY)
Admission: RE | Admit: 2014-12-03 | Payer: Commercial Managed Care - HMO | Source: Ambulatory Visit | Admitting: Gastroenterology

## 2014-12-03 HISTORY — DX: Diverticulosis of large intestine without perforation or abscess without bleeding: K57.30

## 2014-12-03 SURGERY — COLONOSCOPY WITH PROPOFOL
Anesthesia: Monitor Anesthesia Care

## 2014-12-04 ENCOUNTER — Other Ambulatory Visit: Payer: Self-pay | Admitting: Gastroenterology

## 2015-01-20 DIAGNOSIS — E780 Pure hypercholesterolemia: Secondary | ICD-10-CM | POA: Diagnosis not present

## 2015-01-20 DIAGNOSIS — Z0000 Encounter for general adult medical examination without abnormal findings: Secondary | ICD-10-CM | POA: Diagnosis not present

## 2015-01-20 DIAGNOSIS — E119 Type 2 diabetes mellitus without complications: Secondary | ICD-10-CM | POA: Diagnosis not present

## 2015-01-20 DIAGNOSIS — Z1389 Encounter for screening for other disorder: Secondary | ICD-10-CM | POA: Diagnosis not present

## 2015-01-20 DIAGNOSIS — I10 Essential (primary) hypertension: Secondary | ICD-10-CM | POA: Diagnosis not present

## 2015-01-20 DIAGNOSIS — Z79899 Other long term (current) drug therapy: Secondary | ICD-10-CM | POA: Diagnosis not present

## 2015-01-20 DIAGNOSIS — E559 Vitamin D deficiency, unspecified: Secondary | ICD-10-CM | POA: Diagnosis not present

## 2015-02-13 ENCOUNTER — Encounter (HOSPITAL_COMMUNITY): Payer: Self-pay | Admitting: *Deleted

## 2015-02-18 ENCOUNTER — Ambulatory Visit (HOSPITAL_COMMUNITY)
Admission: RE | Admit: 2015-02-18 | Discharge: 2015-02-18 | Disposition: A | Discharge status: Home / Self Care | Payer: Commercial Managed Care - HMO | Source: Ambulatory Visit | Attending: Gastroenterology | Admitting: Gastroenterology

## 2015-02-18 ENCOUNTER — Ambulatory Visit (HOSPITAL_COMMUNITY): Payer: Commercial Managed Care - HMO | Admitting: Registered Nurse

## 2015-02-18 ENCOUNTER — Encounter (HOSPITAL_COMMUNITY): Payer: Self-pay

## 2015-02-18 ENCOUNTER — Encounter (HOSPITAL_COMMUNITY)
Admission: RE | Disposition: A | Discharge status: Home / Self Care | Payer: Self-pay | Source: Ambulatory Visit | Attending: Gastroenterology

## 2015-02-18 DIAGNOSIS — J45909 Unspecified asthma, uncomplicated: Secondary | ICD-10-CM | POA: Diagnosis not present

## 2015-02-18 DIAGNOSIS — R109 Unspecified abdominal pain: Secondary | ICD-10-CM | POA: Diagnosis not present

## 2015-02-18 DIAGNOSIS — I10 Essential (primary) hypertension: Secondary | ICD-10-CM | POA: Diagnosis not present

## 2015-02-18 DIAGNOSIS — Z86010 Personal history of colonic polyps: Secondary | ICD-10-CM | POA: Diagnosis not present

## 2015-02-18 DIAGNOSIS — R938 Abnormal findings on diagnostic imaging of other specified body structures: Secondary | ICD-10-CM | POA: Diagnosis not present

## 2015-02-18 DIAGNOSIS — E780 Pure hypercholesterolemia: Secondary | ICD-10-CM | POA: Insufficient documentation

## 2015-02-18 DIAGNOSIS — R197 Diarrhea, unspecified: Secondary | ICD-10-CM | POA: Insufficient documentation

## 2015-02-18 DIAGNOSIS — Z9049 Acquired absence of other specified parts of digestive tract: Secondary | ICD-10-CM | POA: Diagnosis not present

## 2015-02-18 DIAGNOSIS — Z8673 Personal history of transient ischemic attack (TIA), and cerebral infarction without residual deficits: Secondary | ICD-10-CM | POA: Insufficient documentation

## 2015-02-18 DIAGNOSIS — E119 Type 2 diabetes mellitus without complications: Secondary | ICD-10-CM | POA: Diagnosis not present

## 2015-02-18 DIAGNOSIS — H409 Unspecified glaucoma: Secondary | ICD-10-CM | POA: Insufficient documentation

## 2015-02-18 DIAGNOSIS — Z87891 Personal history of nicotine dependence: Secondary | ICD-10-CM | POA: Diagnosis not present

## 2015-02-18 DIAGNOSIS — Z79899 Other long term (current) drug therapy: Secondary | ICD-10-CM | POA: Diagnosis not present

## 2015-02-18 DIAGNOSIS — Z1211 Encounter for screening for malignant neoplasm of colon: Secondary | ICD-10-CM | POA: Diagnosis not present

## 2015-02-18 DIAGNOSIS — R933 Abnormal findings on diagnostic imaging of other parts of digestive tract: Secondary | ICD-10-CM | POA: Diagnosis not present

## 2015-02-18 DIAGNOSIS — E669 Obesity, unspecified: Secondary | ICD-10-CM | POA: Diagnosis not present

## 2015-02-18 HISTORY — PX: COLONOSCOPY WITH PROPOFOL: SHX5780

## 2015-02-18 HISTORY — DX: Cardiac arrhythmia, unspecified: I49.9

## 2015-02-18 LAB — GLUCOSE, CAPILLARY: Glucose-Capillary: 135 mg/dL — ABNORMAL HIGH (ref 65–99)

## 2015-02-18 SURGERY — COLONOSCOPY WITH PROPOFOL
Anesthesia: Monitor Anesthesia Care

## 2015-02-18 MED ORDER — PROPOFOL 10 MG/ML IV BOLUS
INTRAVENOUS | Status: AC
  Filled 2015-02-18: qty 20

## 2015-02-18 MED ORDER — SODIUM CHLORIDE 0.9 % IV SOLN: INTRAVENOUS | Status: DC

## 2015-02-18 MED ORDER — PROPOFOL INFUSION 10 MG/ML OPTIME
INTRAVENOUS | Status: DC | PRN
  Administered 2015-02-18: 130 ug/kg/min via INTRAVENOUS

## 2015-02-18 MED ORDER — LIDOCAINE HCL (CARDIAC) 20 MG/ML IV SOLN
INTRAVENOUS | Status: AC
  Filled 2015-02-18: qty 5

## 2015-02-18 MED ORDER — LIDOCAINE HCL (CARDIAC) 20 MG/ML IV SOLN
INTRAVENOUS | Status: DC | PRN
Start: 2015-02-18 — End: 2015-02-18
  Administered 2015-02-18: 100 mg via INTRAVENOUS

## 2015-02-18 MED ORDER — PROPOFOL 10 MG/ML IV BOLUS
INTRAVENOUS | Status: DC | PRN
  Administered 2015-02-18 (×2): 20 mg via INTRAVENOUS

## 2015-02-18 MED ORDER — LACTATED RINGERS IV SOLN
INTRAVENOUS | Status: DC | PRN
  Administered 2015-02-18: 09:00:00 via INTRAVENOUS

## 2015-02-18 SURGICAL SUPPLY — 21 items

## 2015-02-18 NOTE — Anesthesia Procedure Notes (Signed)
Procedure Name: MAC Date/Time: 02/18/2015 9:16 AM Performed by: Carleene Cooper A Pre-anesthesia Checklist: Patient identified, Timeout performed, Emergency Drugs available, Suction available and Patient being monitored Patient Re-evaluated:Patient Re-evaluated prior to inductionOxygen Delivery Method: Simple face mask Dental Injury: Teeth and Oropharynx as per pre-operative assessment

## 2015-02-18 NOTE — Transfer of Care (Signed)
Immediate Anesthesia Transfer of Care Note  Patient: Kathleen Oliver  Procedure(s) Performed: Procedure(s): COLONOSCOPY WITH PROPOFOL (N/A)  Patient Location: PACU and Endoscopy Unit  Anesthesia Type:MAC  Level of Consciousness: awake, alert , oriented and patient cooperative  Airway & Oxygen Therapy: Patient Spontanous Breathing and Patient connected to face mask oxygen  Post-op Assessment: Report given to RN, Post -op Vital signs reviewed and stable and Patient moving all extremities  Post vital signs: Reviewed and stable  Last Vitals:  Filed Vitals:   02/18/15 0835  BP: 109/66  Pulse: 70  Temp: 36.7 C  Resp: 13    Complications: No apparent anesthesia complications

## 2015-02-18 NOTE — Anesthesia Preprocedure Evaluation (Addendum)
Anesthesia Evaluation  Patient identified by MRN, date of birth, ID band Patient awake    Reviewed: Allergy & Precautions, NPO status , Patient's Chart, lab work & pertinent test results  Airway Mallampati: II  TM Distance: >3 FB Neck ROM: Full    Dental no notable dental hx.    Pulmonary asthma , former smoker,  breath sounds clear to auscultation  Pulmonary exam normal       Cardiovascular hypertension, Pt. on medications Normal cardiovascular examRhythm:Regular Rate:Normal     Neuro/Psych Seizures -,  CVA, No Residual Symptoms negative psych ROS   GI/Hepatic negative GI ROS, Neg liver ROS,   Endo/Other  diabetes, Type 2, Oral Hypoglycemic Agents  Renal/GU negative Renal ROS  negative genitourinary   Musculoskeletal negative musculoskeletal ROS (+)   Abdominal   Peds negative pediatric ROS (+)  Hematology negative hematology ROS (+)   Anesthesia Other Findings   Reproductive/Obstetrics negative OB ROS                          Anesthesia Physical Anesthesia Plan  ASA: III  Anesthesia Plan: MAC   Post-op Pain Management:    Induction:   Airway Management Planned: Simple Face Mask  Additional Equipment:   Intra-op Plan:   Post-operative Plan:   Informed Consent: I have reviewed the patients History and Physical, chart, labs and discussed the procedure including the risks, benefits and alternatives for the proposed anesthesia with the patient or authorized representative who has indicated his/her understanding and acceptance.   Dental advisory given  Plan Discussed with: CRNA  Anesthesia Plan Comments:         Anesthesia Quick Evaluation

## 2015-02-18 NOTE — Op Note (Signed)
Procedure: Surveillance colonoscopy. 01/26/2011 colonoscopy performed with removal of a 4 mm adenomatous transverse colon polyp. Segmental sigmoid colon resection following diverticular perforation. Chronic, intermittent lower abdominal cramps with explosive, watery, nonbloody diarrhea approximately 3 times per week. February 2016 hospitalization to evaluate and treat abdominal pain with bloody diarrhea. 08/22/2014 CT scan of the abdomen and pelvis showed "descending colon colitis".  Endoscopist: Earle Gell  Premedication: Propofol administered by anesthesia  Procedure: The patient was placed in the left lateral decubitus position. Anal inspection and digital rectal exam were normal. The Pentax pediatric colonoscope was introduced into the rectum and advanced to the cecum. A normal-appearing appendiceal orifice and ileocecal valve were identified. Colonic preparation for the exam today was good. Withdrawal time was 9 minutes  Rectum. Normal. Retroflexed view of the distal rectum was normal  Sigmoid colon and descending colon. Normal post-segmental sigmoid colon resection to treat diverticular perforation  Splenic flexure. Normal  Transverse colon. Normal  Hepatic flexure. Normal  Ascending colon. Normal  Cecum and ileocecal valve. Normal  Random colon biopsies. Biopsies were performed from the right colon and left colon to look for microscopic colitis  Assessment: Normal surveillance colonoscopy post sigmoid colon resection for diverticular perforation. Random colon biopsies to look for microscopic colitis pending.  Recommendation: Schedule surveillance colonoscopy at age 46

## 2015-02-18 NOTE — Anesthesia Postprocedure Evaluation (Signed)
  Anesthesia Post-op Note  Patient: Kathleen Oliver  Procedure(s) Performed: Procedure(s) (LRB): COLONOSCOPY WITH PROPOFOL (N/A)  Patient Location: PACU  Anesthesia Type: MAC  Level of Consciousness: awake and alert   Airway and Oxygen Therapy: Patient Spontanous Breathing  Post-op Pain: mild  Post-op Assessment: Post-op Vital signs reviewed, Patient's Cardiovascular Status Stable, Respiratory Function Stable, Patent Airway and No signs of Nausea or vomiting  Last Vitals:  Filed Vitals:   02/18/15 1010  BP: 146/56  Pulse: 60  Temp:   Resp: 12    Post-op Vital Signs: stable   Complications: No apparent anesthesia complications

## 2015-02-18 NOTE — H&P (Signed)
  Procedure: Surveillance colonoscopy. History of adenomatous colon polyps removed colonoscopically in the past. February hospitalization to evaluate and treat abdominal pain with bloody diarrhea. 08/22/2014 CT scan of the abdomen and pelvis showed "descending colon colitis". 70/31/2012 colonoscopy performed with removal of a 4 mm adenomatous transverse colon polyp. Chronic nonbloody diarrhea.  History: The patient is a 69 year old female born 26-Nov-1945. On 01/26/2011 she underwent a colonoscopy with removal of a 4 mm adenomatous transverse colon polyp. She was hospitalized in February 2016 to evaluate and treat abdominal pain with bloody diarrhea. 08/22/2014 CT scan of the abdomen and pelvis showed "descending colon colitis". She continues to have intermittent lower abdominal cramps with watery, nonbloody diarrhea.  Patient is scheduled to undergo a surveillance colonoscopy with performance of random colon biopsies to look for microscopic colitis.  Past medical history: Hypertension. Hypercholesterolemia. Segmental sigmoid colon resection for diverticular disease. Ocular migraines. Glaucoma. Type 2 diabetes mellitus.tah-bso. right rotator cuff repair. Bladder suspension surgery.  Medication allergies: Lipitor caused elevation in the CPK. Penicillin caused hives. Statin drugs caused gastrointestinal upset.  Exam: The patient is alert and lying comfortably on the endoscopy stretcher. Abdomen is soft and nontender to palpation. Lungs are clear to auscultation. Cardiac exam reveals a regular rhythm.  Plan: Proceed with surveillance colonoscopy. Perform random colon biopsies to microscopic colitis.

## 2015-02-19 ENCOUNTER — Encounter (HOSPITAL_COMMUNITY): Payer: Self-pay | Admitting: Gastroenterology

## 2015-02-26 ENCOUNTER — Encounter: Payer: Self-pay | Admitting: *Deleted

## 2015-02-26 ENCOUNTER — Encounter: Payer: Commercial Managed Care - HMO | Attending: Geriatric Medicine | Admitting: *Deleted

## 2015-02-26 VITALS — Ht 61.0 in | Wt 180.5 lb

## 2015-02-26 DIAGNOSIS — Z713 Dietary counseling and surveillance: Secondary | ICD-10-CM | POA: Insufficient documentation

## 2015-02-26 DIAGNOSIS — E119 Type 2 diabetes mellitus without complications: Secondary | ICD-10-CM

## 2015-02-26 NOTE — Patient Instructions (Signed)
Plan:  Aim for 2 Carb Choices per meal (30 grams) +/- 1 either way  Aim for 0-1 Carbs per snack if hungry  Include lean protein in moderation with your meals and snacks Continue reading food labels for Total Carbohydrate and Fat Grams of foods Consider  increasing your activity level by looking for Arm Chair Exeriise Videos for 5-10 minutes twice daily as tolerated Continue checking BG at alternate times per day   Continue taking medication as directed by MD

## 2015-02-26 NOTE — Progress Notes (Signed)
Diabetes Self-Management Education  Visit Type: First/Initial  Appt. Start Time: 1030 Appt. End Time: 1200  02/26/2015  [name], identified by name and date of birth, is a 69 y.o. female with a diagnosis of Diabetes: Type 2.   ASSESSMENT  Height 5\' 1"  (1.549 m), weight 180 lb 8 oz (81.874 kg). Body mass index is 34.12 kg/(m^2).      Diabetes Self-Management Education - 02/26/15 1046    Visit Information   Visit Type First/Initial   Initial Visit   Diabetes Type Type 2   Are you taking your medications as prescribed? Yes   Health Coping   How would you rate your overall health? Good   Psychosocial Assessment   Patient Belief/Attitude about Diabetes Motivated to manage diabetes   Self-care barriers None   Self-management support None  lives alone and some friends bring wrong foods for her   Other persons present Patient   Patient Concerns Nutrition/Meal planning;Weight Control   Special Needs None   Preferred Learning Style Auditory;Visual   Learning Readiness Change in progress   How often do you need to have someone help you when you read instructions, pamphlets, or other written materials from your doctor or pharmacy? 1 - Never   What is the last grade level you completed in school? GED    Complications   Last HgB A1C per patient/outside source 7.1 %   How often do you check your blood sugar? 1-2 times/day   Fasting Blood glucose range (mg/dL) 70-129   Postprandial Blood glucose range (mg/dL) 180-200   Have you had a dilated eye exam in the past 12 months? Yes   Have you had a dental exam in the past 12 months? No   Are you checking your feet? Yes   How many days per week are you checking your feet? 7   Dietary Intake   Breakfast 1 or 2 canadian bacon, 1 egg, 1/2 [place] Muffin, OR oatmeal with walnuts and raisins, 1/2 English muffing,    Snack (morning) light string cheese OR fresh fruit   Lunch hot meal after church OR sandwich OR burger with /2 bun and  cheese   Snack (afternoon) fresh fruit OR cookie OR graham crackers with PNB   Dinner Lean Cuisine OR lean meat with vegetables and a starch   Snack (evening) ow fat crackers with PNB OR  1/2 small bagel with low fat spread and 4 oz low fat milk   Beverage(s) coffee with Splenda and low fat milk, OR decaf unsweet tea   Exercise   Exercise Type Light (walking / raking leaves)  walks as able due to arthritis, outside and on treadmill occasionally   How many days per week to you exercise? 2   How many minutes per day do you exercise? 10   Total minutes per week of exercise 20   Patient Education   Previous Diabetes Education Yes (please comment)  [place] 3 years ago   Disease state  Definition of diabetes, type 1 and 2, and the diagnosis of diabetes   Nutrition management  Role of diet in the treatment of diabetes and the relationship between the three main macronutrients and blood glucose level;Food label reading, portion sizes and measuring food.;Reviewed blood glucose goals for pre and post meals and how to evaluate the patients' food intake on their blood glucose level.   Physical activity and exercise  Role of exercise on diabetes management, blood pressure control and cardiac health.;Helped patient identify appropriate  exercises in relation to his/her diabetes, diabetes complications and other health issue.   Monitoring Purpose and frequency of SMBG.;Identified appropriate SMBG and/or A1C goals.   Chronic complications Relationship between chronic complications and blood glucose control   Psychosocial adjustment Role of stress on diabetes;Helped patient identify a support system for diabetes management  DM 2 Support Group encouraged   Individualized Goals (developed by patient)   Nutrition Follow meal plan discussed   Physical Activity Exercise 5-7 days per week  Start with 5-10 minutes twice daily and increase as tolerated   Medications take my medication as prescribed   Monitoring  test  blood glucose pre and post meals as discussed   Reducing Risk examine blood glucose patterns;increase portions of healthy fats   Outcomes   Expected Outcomes Demonstrated interest in learning. Expect positive outcomes   Future DMSE 4-6 wks   Program Status Not Completed      Individualized Plan for Diabetes Self-Management Training:   Learning Objective:  Patient will have a greater understanding of diabetes self-management. Patient education plan is to attend individual and/or group sessions per assessed needs and concerns.   Plan:   Patient Instructions  Plan:  Aim for 2 Carb Choices per meal (30 grams) +/- 1 either way  Aim for 0-1 Carbs per snack if hungry  Include lean protein in moderation with your meals and snacks Continue reading food labels for Total Carbohydrate and Fat Grams of foods Consider  increasing your activity level by looking for Arm Chair Exeriise Videos for 5-10 minutes twice daily as tolerated Continue checking BG at alternate times per day   Continue taking medication as directed by MD      Expected Outcomes:  Demonstrated interest in learning. Expect positive outcomes  Education material provided: Living Well with Diabetes, Meal plan card, Support group flyer and Carbohydrate counting sheet, Carb Counting Book with list of Lean Meats and Healthy Fats  If problems or questions, patient to contact team via:  Phone and Email  Future DSME appointment: 4-6 wks

## 2015-02-27 DIAGNOSIS — H524 Presbyopia: Secondary | ICD-10-CM | POA: Diagnosis not present

## 2015-02-27 DIAGNOSIS — H26491 Other secondary cataract, right eye: Secondary | ICD-10-CM | POA: Diagnosis not present

## 2015-02-27 DIAGNOSIS — H5212 Myopia, left eye: Secondary | ICD-10-CM | POA: Diagnosis not present

## 2015-02-27 DIAGNOSIS — Z961 Presence of intraocular lens: Secondary | ICD-10-CM | POA: Diagnosis not present

## 2015-02-27 DIAGNOSIS — E119 Type 2 diabetes mellitus without complications: Secondary | ICD-10-CM | POA: Diagnosis not present

## 2015-02-27 DIAGNOSIS — H4011X1 Primary open-angle glaucoma, mild stage: Secondary | ICD-10-CM | POA: Diagnosis not present

## 2015-02-27 DIAGNOSIS — H52221 Regular astigmatism, right eye: Secondary | ICD-10-CM | POA: Diagnosis not present

## 2015-03-10 DIAGNOSIS — Z803 Family history of malignant neoplasm of breast: Secondary | ICD-10-CM | POA: Diagnosis not present

## 2015-03-10 DIAGNOSIS — R922 Inconclusive mammogram: Secondary | ICD-10-CM | POA: Diagnosis not present

## 2015-03-10 DIAGNOSIS — R928 Other abnormal and inconclusive findings on diagnostic imaging of breast: Secondary | ICD-10-CM | POA: Diagnosis not present

## 2015-03-10 DIAGNOSIS — N6001 Solitary cyst of right breast: Secondary | ICD-10-CM | POA: Diagnosis not present

## 2015-03-10 DIAGNOSIS — Z1231 Encounter for screening mammogram for malignant neoplasm of breast: Secondary | ICD-10-CM | POA: Diagnosis not present

## 2015-03-14 DIAGNOSIS — R922 Inconclusive mammogram: Secondary | ICD-10-CM | POA: Diagnosis not present

## 2015-03-14 DIAGNOSIS — R928 Other abnormal and inconclusive findings on diagnostic imaging of breast: Secondary | ICD-10-CM | POA: Diagnosis not present

## 2015-03-14 DIAGNOSIS — Z803 Family history of malignant neoplasm of breast: Secondary | ICD-10-CM | POA: Diagnosis not present

## 2015-03-14 DIAGNOSIS — N6001 Solitary cyst of right breast: Secondary | ICD-10-CM | POA: Diagnosis not present

## 2015-03-14 DIAGNOSIS — Z1231 Encounter for screening mammogram for malignant neoplasm of breast: Secondary | ICD-10-CM | POA: Diagnosis not present

## 2015-04-04 DIAGNOSIS — J309 Allergic rhinitis, unspecified: Secondary | ICD-10-CM | POA: Diagnosis not present

## 2015-04-04 DIAGNOSIS — E119 Type 2 diabetes mellitus without complications: Secondary | ICD-10-CM | POA: Diagnosis not present

## 2015-04-04 DIAGNOSIS — R51 Headache: Secondary | ICD-10-CM | POA: Diagnosis not present

## 2015-04-04 DIAGNOSIS — I10 Essential (primary) hypertension: Secondary | ICD-10-CM | POA: Diagnosis not present

## 2015-04-17 ENCOUNTER — Ambulatory Visit: Payer: Commercial Managed Care - HMO | Admitting: *Deleted

## 2015-05-01 ENCOUNTER — Emergency Department (HOSPITAL_COMMUNITY): Payer: Commercial Managed Care - HMO

## 2015-05-01 ENCOUNTER — Emergency Department (HOSPITAL_COMMUNITY)
Admission: EM | Admit: 2015-05-01 | Discharge: 2015-05-01 | Disposition: A | Discharge status: Home / Self Care | Payer: Commercial Managed Care - HMO | Attending: Emergency Medicine | Admitting: Emergency Medicine

## 2015-05-01 ENCOUNTER — Encounter (HOSPITAL_COMMUNITY): Payer: Self-pay | Admitting: Emergency Medicine

## 2015-05-01 DIAGNOSIS — Z87891 Personal history of nicotine dependence: Secondary | ICD-10-CM | POA: Insufficient documentation

## 2015-05-01 DIAGNOSIS — R079 Chest pain, unspecified: Secondary | ICD-10-CM | POA: Diagnosis not present

## 2015-05-01 DIAGNOSIS — E669 Obesity, unspecified: Secondary | ICD-10-CM | POA: Diagnosis not present

## 2015-05-01 DIAGNOSIS — Z8719 Personal history of other diseases of the digestive system: Secondary | ICD-10-CM | POA: Insufficient documentation

## 2015-05-01 DIAGNOSIS — I10 Essential (primary) hypertension: Secondary | ICD-10-CM | POA: Diagnosis not present

## 2015-05-01 DIAGNOSIS — Z8739 Personal history of other diseases of the musculoskeletal system and connective tissue: Secondary | ICD-10-CM | POA: Diagnosis not present

## 2015-05-01 DIAGNOSIS — J45901 Unspecified asthma with (acute) exacerbation: Secondary | ICD-10-CM | POA: Diagnosis not present

## 2015-05-01 DIAGNOSIS — E7800 Pure hypercholesterolemia, unspecified: Secondary | ICD-10-CM | POA: Insufficient documentation

## 2015-05-01 DIAGNOSIS — Z8673 Personal history of transient ischemic attack (TIA), and cerebral infarction without residual deficits: Secondary | ICD-10-CM | POA: Diagnosis not present

## 2015-05-01 DIAGNOSIS — Z79899 Other long term (current) drug therapy: Secondary | ICD-10-CM | POA: Insufficient documentation

## 2015-05-01 DIAGNOSIS — E119 Type 2 diabetes mellitus without complications: Secondary | ICD-10-CM | POA: Diagnosis not present

## 2015-05-01 DIAGNOSIS — R5383 Other fatigue: Secondary | ICD-10-CM | POA: Insufficient documentation

## 2015-05-01 DIAGNOSIS — Z8701 Personal history of pneumonia (recurrent): Secondary | ICD-10-CM | POA: Insufficient documentation

## 2015-05-01 DIAGNOSIS — Z880 Allergy status to penicillin: Secondary | ICD-10-CM | POA: Diagnosis not present

## 2015-05-01 DIAGNOSIS — R0602 Shortness of breath: Secondary | ICD-10-CM

## 2015-05-01 DIAGNOSIS — Z9889 Other specified postprocedural states: Secondary | ICD-10-CM | POA: Insufficient documentation

## 2015-05-01 LAB — BASIC METABOLIC PANEL
ANION GAP: 10 (ref 5–15)
BUN: 23 mg/dL — ABNORMAL HIGH (ref 6–20)
CHLORIDE: 103 mmol/L (ref 101–111)
CO2: 25 mmol/L (ref 22–32)
Calcium: 9.3 mg/dL (ref 8.9–10.3)
Creatinine, Ser: 0.89 mg/dL (ref 0.44–1.00)
Glucose, Bld: 185 mg/dL — ABNORMAL HIGH (ref 65–99)
POTASSIUM: 3.9 mmol/L (ref 3.5–5.1)
SODIUM: 138 mmol/L (ref 135–145)

## 2015-05-01 LAB — I-STAT TROPONIN, ED: Troponin i, poc: 0 ng/mL (ref 0.00–0.08)

## 2015-05-01 LAB — CBC
HEMATOCRIT: 37.1 % (ref 36.0–46.0)
HEMOGLOBIN: 12 g/dL (ref 12.0–15.0)
MCH: 28.6 pg (ref 26.0–34.0)
MCHC: 32.3 g/dL (ref 30.0–36.0)
MCV: 88.3 fL (ref 78.0–100.0)
Platelets: 283 10*3/uL (ref 150–400)
RBC: 4.2 MIL/uL (ref 3.87–5.11)
RDW: 14.7 % (ref 11.5–15.5)
WBC: 5.4 10*3/uL (ref 4.0–10.5)

## 2015-05-01 LAB — D-DIMER, QUANTITATIVE (NOT AT ARMC): D DIMER QUANT: 0.82 ug{FEU}/mL — AB (ref 0.00–0.48)

## 2015-05-01 MED ORDER — IOHEXOL 350 MG/ML SOLN
100.0000 mL | Freq: Once | INTRAVENOUS | Status: AC | PRN
  Administered 2015-05-01: 100 mL via INTRAVENOUS

## 2015-05-01 NOTE — ED Provider Notes (Signed)
CSN: [id number]     Arrival date & time 05/01/15  1210 History   First MD Initiated Contact with Patient 05/01/15 1304     Chief Complaint  Patient presents with  . Chest Pain  . Shortness of Breath  . Fatigue      The history is provided by the patient.   patient reports intermittent chest discomfort and pressure with associated weakness.  She states when she stands up she feels as though her pulse rate goes up.  She's also had cough.  She denies orthopnea but does report that her symptoms are somewhat worse at nighttime.  She reports trace edema in her bilateral lower extremities.  She has had some belching.  She denies active chest pain at this time.  She denies shortness of breath at this time.  She is seeing cardiology before in the past.  She had a heart catheterization in 2011 that per cardiology notes was "clean".  She had echoes both in 2011 and 2014.  In 2014 she had trace TR and AI.  No history of congestive heart failure.  She feels somewhat lightheaded when she stands up.  She be drinking normally.  No melena or hematochezia.  No fevers or chills.  No other complaints  Past Medical History  Diagnosis Date  . GERD (gastroesophageal reflux disease)   . Hypertension   . Hypercholesteremia   . Obesity   . HTN (hypertension)   . Osteoarthrosis   . Complication of anesthesia 1999    "had 3 seizures & ended up in ICU days after I'd had anesthesia"   . Bronchial asthma   . Pneumonia 2000's X 1  . Chronic bronchitis ([place])     "numerous years; not q yr"   . Type II diabetes mellitus ([place])   . History of hiatal hernia   . Stroke ([place])     /MRI from 1999;; showed I'd had had several small ones"-no residual effects.  . Diverticulosis of colon   . Seizures ([place]) 1999 X 3    "days after anesthesia"- no further issues  . Dysrhythmia     PVC's   Past Surgical History  Procedure Laterality Date  . Esophageal dilation  2000's X 1  . Bladder suspension  2000's  . Tonsillectomy and  adenoidectomy  1950's  . Appendectomy  ~ 2000  . Colostomy  ~ 2000  . Colostomy reversal  ~ 2000    "only needed it 9 months"  . Cholecystectomy open  1984  . Abdominal hernia repair  2001 X 2  . Carpal tunnel release Bilateral ~ 2003  . Hernia repair    . Cataract extraction w/ intraocular lens  implant, bilateral Bilateral ~ 2012  . Total abdominal hysterectomy  2004  . Cardiac catheterization  1990's? ; 11/2009  . Trigger finger release Right 10/2009    "ring finger"  . Trigger finger release Right 04/2014    "index & pinky"  . Colectomy  ~ 2000    "perforated colon due to diverticulitis"  . Shoulder arthroscopy w/ rotator cuff repair Right     x2 -scope '07  . Colonoscopy with propofol N/A 02/18/2015    Procedure: COLONOSCOPY WITH PROPOFOL;  Surgeon: Garlan Fair, MD;  Location: [facility];  Service: Endoscopy;  Laterality: N/A;   Family History  Problem Relation Age of Onset  . Diverticulitis Mother   . Hypertension Mother   . Heart attack Father   . Heart attack Brother   . Diabetes  Brother   . Hypertension Sister    Social History  Substance Use Topics  . Smoking status: Former Smoker -- 2.00 packs/day for 20 years    Types: Cigarettes    Quit date: 02/15/1982  . Smokeless tobacco: Never Used  . Alcohol Use: Yes     Comment: "stopped drinking in1984"   OB History    No data available     Review of Systems  All other systems reviewed and are negative.     Allergies  Cinobac; Codeine; Penicillins; and Tizanidine  Home Medications   Prior to Admission medications   Medication Sig Start Date End Date Taking? Authorizing Provider  acetaminophen (TYLENOL) 500 MG tablet Take 500 mg by mouth every 6 (six) hours as needed for headache.   Yes Historical Provider, MD  albuterol (PROVENTIL HFA;VENTOLIN HFA) 108 (90 BASE) MCG/ACT inhaler Inhale 2 puffs into the lungs every 6 (six) hours as needed for wheezing or shortness of breath.   Yes Historical Provider,  MD  amLODipine (NORVASC) 10 MG tablet Take 10 mg by mouth every morning.    Yes Historical Provider, MD  Cholecalciferol (VITAMIN D) 2000 UNITS CAPS Take 2,000 Units by mouth daily.   Yes Historical Provider, MD  cholestyramine light (PREVALITE) 4 G packet Take 4 g by mouth daily. Between lunch and supper.   Yes Historical Provider, MD  clopidogrel (PLAVIX) 75 MG tablet Take 75 mg by mouth at bedtime.   Yes Historical Provider, MD  famotidine (PEPCID) 10 MG tablet Take 10 mg by mouth daily with lunch.    Yes Historical Provider, MD  FLUVIRIN PRESERVATIVE FREE 0.5 ML SUSY Inject 1 Dose as directed once. 03/22/15  Yes Historical Provider, MD  hydrochlorothiazide (HYDRODIURIL) 25 MG tablet Take 25 mg by mouth every morning.   Yes Historical Provider, MD  latanoprost (XALATAN) 0.005 % ophthalmic solution Place 1 drop into both eyes at bedtime.  05/15/13  Yes Historical Provider, MD  lisinopril (PRINIVIL,ZESTRIL) 10 MG tablet Take 10 mg by mouth every morning.    Yes Historical Provider, MD  metFORMIN (GLUCOPHAGE-XR) 500 MG 24 hr tablet Take 500 mg by mouth every evening.   Yes Historical Provider, MD  Naphazoline-Pheniramine (OPCON-A OP) Apply 1 drop to eye 2 (two) times daily as needed (allergies.).   Yes Historical Provider, MD  Neomycin-Bacitracin-Polymyxin (HCA TRIPLE ANTIBIOTIC OINTMENT EX) Apply 1 application topically daily as needed (wounds).  04/08/15  Yes Historical Provider, MD  Simethicone (GAS-X PO) Take 1 capsule by mouth daily as needed (abdominal pain).   Yes Historical Provider, MD  sitaGLIPtin (JANUVIA) 100 MG tablet Take 100 mg by mouth daily.   Yes Historical Provider, MD  sodium chloride (OCEAN) 0.65 % SOLN nasal spray Place 1-2 sprays into both nostrils daily as needed for congestion.   Yes Historical Provider, MD  traMADol (ULTRAM) 50 MG tablet Take 50 mg by mouth every 6 (six) hours as needed (Back pain.).    Yes Historical Provider, MD  Lactobacillus Bifidus (LACTO-BIFIDUS-600)  CAPS Take 1 capsule by mouth daily. Patient taking differently: Take 1 capsule by mouth at bedtime.  08/25/14   Kinnie Feil, MD   BP 125/59 mmHg  Pulse 64  Temp(Src) 97.7 F (36.5 C) (Oral)  Resp 12  SpO2 95% Physical Exam  Constitutional: She is oriented to person, place, and time. She appears well-developed and well-nourished. No distress.  HENT:  Head: Normocephalic and atraumatic.  Eyes: EOM are normal.  Neck: Normal range of motion.  Cardiovascular:  Normal rate, regular rhythm and normal heart sounds.   Pulmonary/Chest: Effort normal and breath sounds normal.  Abdominal: Soft. She exhibits no distension. There is no tenderness.  Musculoskeletal: Normal range of motion.  Trace edema bilaterally  Neurological: She is alert and oriented to person, place, and time.  Skin: Skin is warm and dry.  Psychiatric: She has a normal mood and affect. Judgment normal.  Nursing note and vitals reviewed.   ED Course  Procedures (including critical care time) Labs Review Labs Reviewed  BASIC METABOLIC PANEL - Abnormal; Notable for the following:    Glucose, Bld 185 (*)    BUN 23 (*)    All other components within normal limits  D-DIMER, QUANTITATIVE (NOT AT [facility]) - Abnormal; Notable for the following:    D-Dimer, Quant 0.82 (*)    All other components within normal limits  CBC  I-STAT TROPOININ, ED    Imaging Review Dg Chest 2 View  05/01/2015  CLINICAL DATA:  Left-sided chest pressure EXAM: CHEST  2 VIEW COMPARISON:  04/29/2012 FINDINGS: Normal heart size. Clear lungs. No pneumothorax or pleural effusion. IMPRESSION: No active cardiopulmonary disease. Electronically Signed   By: Marybelle Killings M.D.   On: 05/01/2015 13:48   Ct Angio Chest Pe W/cm &/or Wo Cm  05/01/2015  CLINICAL DATA:  Left-sided chest pressure. Weakness fatigue and shortness of breath for 2 days EXAM: CT ANGIOGRAPHY CHEST WITH CONTRAST TECHNIQUE: Multidetector CT imaging of the chest was performed using the  standard protocol during bolus administration of intravenous contrast. Multiplanar CT image reconstructions and MIPs were obtained to evaluate the vascular anatomy. CONTRAST:  168mL OMNIPAQUE IOHEXOL 350 MG/ML SOLN COMPARISON:  None. FINDINGS: Mediastinum: Heart size is normal. There is no pericardial effusion. No mediastinal or hilar adenopathy. The trachea appears patent and is midline. Low attenuation nodule in the right lobe of thyroid gland measures 2.5 cm, image 4 of series 4. Normal appearance of the esophagus. The main pulmonary artery is patent. No lobar or segmental pulmonary artery filling defects to suggest a clinically significant acute pulmonary embolus. Lungs/Pleura: No pleural fluid identified. No airspace consolidation or atelectasis noted. No suspicious nodule or mass identified. There is a small granuloma noted in the left upper lobe. Upper Abdomen: The visualized portions of the liver are normal. Previous cholecystectomy. No biliary dilatation. The visualized portions of the spleen are normal. The adrenal glands are unremarkable. Musculoskeletal: No aggressive lytic or sclerotic bone lesions identified. Review of the MIP images confirms the above findings. IMPRESSION: 1. No acute cardiopulmonary abnormalities and no evidence for acute pulmonary embolus. 2. Low density nodule in right lobe of thyroid gland noted. Consider further evaluation with thyroid ultrasound. If patient is clinically hyperthyroid, consider nuclear medicine thyroid uptake and scan. Electronically Signed   By: Kerby Moors M.D.   On: 05/01/2015 15:51   I have personally reviewed and evaluated these images and lab results as part of my medical decision-making.   EKG Interpretation   Date/Time:  Thursday May 01 2015 12:14:56 EDT Ventricular Rate:  87 PR Interval:  188 QRS Duration: 106 QT Interval:  358 QTC Calculation: 431 R Axis:   64 Text Interpretation:  Sinus rhythm No significant change was found   Confirmed by Scotlyn Mccranie  MD, Lennette Bihari ([id number]) on 05/01/2015 2:12:23 PM      MDM   Final diagnoses:  SOB (shortness of breath)   [facility] heart catheterization 2011.  Last echo 2014 is without significant abnormality.   4:00 PM Pt ambulated  around the emergency department without difficulty. On return to ER bed her HR is 96 and pulse ox is 99% on RA.  Outpatient primary care follow-up as well as cardiology follow-up.  Patient understands return the ER for new or worsening symptoms.  Vital signs are normal.  CT scan shows no PE.    Jola Schmidt, MD 05/01/15 [phone]

## 2015-05-01 NOTE — Discharge Instructions (Signed)

## 2015-05-01 NOTE — ED Notes (Addendum)
Pt states left sided chest pressure, increasing weakness/fatigue, and SOB on-going x 2 days. States whenever she gets up to walk around "my pulse rate goes up almost to 118." States she's also had a cough with this, worse in the morning and when she lays down at night. Lung sounds clear in all lobes. Denies N/V/D, fever/chills. Also complaining of burping more frequently and upper back pain w/ a hx of GERD.

## 2015-05-02 DIAGNOSIS — R000 Tachycardia, unspecified: Secondary | ICD-10-CM | POA: Diagnosis not present

## 2015-05-02 DIAGNOSIS — I10 Essential (primary) hypertension: Secondary | ICD-10-CM | POA: Diagnosis not present

## 2015-05-02 DIAGNOSIS — R0789 Other chest pain: Secondary | ICD-10-CM | POA: Diagnosis not present

## 2015-05-10 DIAGNOSIS — R05 Cough: Secondary | ICD-10-CM | POA: Diagnosis not present

## 2015-05-14 ENCOUNTER — Encounter: Payer: Commercial Managed Care - HMO | Attending: Geriatric Medicine | Admitting: *Deleted

## 2015-05-14 ENCOUNTER — Encounter: Payer: Self-pay | Admitting: *Deleted

## 2015-05-14 VITALS — Ht 61.0 in | Wt 179.5 lb

## 2015-05-14 DIAGNOSIS — Z713 Dietary counseling and surveillance: Secondary | ICD-10-CM | POA: Diagnosis not present

## 2015-05-14 DIAGNOSIS — E119 Type 2 diabetes mellitus without complications: Secondary | ICD-10-CM | POA: Diagnosis not present

## 2015-05-14 NOTE — Patient Instructions (Signed)
Plan:  Aim for 2 Carb Choices per meal (30 grams) +/- 1 either way  Aim for 0-1 Carbs per snack if hungry  Include lean protein in moderation with your meals and snacks Continue reading food labels for Total Carbohydrate and Fat Grams of foods Continue  increasing your activity level by looking for Arm Chair Exeriise Videos for 5-10 minutes twice daily as tolerated Continue checking BG at alternate times per day   Continue taking medication as directed by MD

## 2015-05-17 NOTE — Progress Notes (Signed)
Diabetes Self-Management Education  Visit Type:  Follow-up  Appt. Start Time: 1130 Appt. End Time: 1200  05/17/2015  Kathleen Oliver, identified by name and date of birth, is a 69 y.o. female with a diagnosis of Diabetes: Type 2.   ASSESSMENT  Height 5\' 1"  (1.549 m), weight 179 lb 8 oz (81.421 kg). Body mass index is 33.93 kg/(m^2).       Diabetes Self-Management Education - 05/14/15 1159    Health Coping   How would you rate your overall health? Good   Complications   Fasting Blood glucose range (mg/dL) 70-129   Postprandial Blood glucose range (mg/dL) 130-179   Number of hypoglycemic episodes per month 0   Exercise   Exercise Type ADL's   Patient Education   Physical activity and exercise  Role of exercise on diabetes management, blood pressure control and cardiac health.;Helped patient identify appropriate exercises in relation to his/her diabetes, diabetes complications and other health issue.  Suggest Arm Chair Exercises daily as tolerated   Individualized Goals (developed by patient)   Nutrition Follow meal plan discussed   Physical Activity Exercise 5-7 days per week   Medications take my medication as prescribed   Monitoring  test blood glucose pre and post meals as discussed   Patient Self-Evaluation of Goals - Patient rates self as meeting previously set goals (% of time)   Nutrition >75%   Physical Activity >75%   Medications >75%   Monitoring >75%   Problem Solving >75%   Reducing Risk >75%   Health Coping >75%   Outcomes   Program Status Completed   Subsequent Visit   Since your last visit have you continued or begun to take your medications as prescribed? Yes      Learning Objective:  Patient will have a greater understanding of diabetes self-management. Patient education plan is to attend individual and/or group sessions per assessed needs and concerns.   Plan:   Patient Instructions  Plan:  Aim for 2 Carb Choices per meal (30 grams) +/- 1  either way  Aim for 0-1 Carbs per snack if hungry  Include lean protein in moderation with your meals and snacks Continue reading food labels for Total Carbohydrate and Fat Grams of foods Continue  increasing your activity level by looking for Arm Chair Exeriise Videos for 5-10 minutes twice daily as tolerated Continue checking BG at alternate times per day   Continue taking medication as directed by MD       Expected Outcomes:  Demonstrated interest in learning. Expect positive outcomes  Education material provided: Arm Chair exercise handout  If problems or questions, patient to contact team via:  Phone and Email  Future DSME appointment: - PRN

## 2015-05-27 DIAGNOSIS — R05 Cough: Secondary | ICD-10-CM | POA: Diagnosis not present

## 2015-05-27 DIAGNOSIS — R002 Palpitations: Secondary | ICD-10-CM | POA: Diagnosis not present

## 2015-05-29 ENCOUNTER — Encounter (INDEPENDENT_AMBULATORY_CARE_PROVIDER_SITE_OTHER): Payer: Commercial Managed Care - HMO

## 2015-05-29 DIAGNOSIS — R002 Palpitations: Secondary | ICD-10-CM

## 2015-06-05 DIAGNOSIS — M47816 Spondylosis without myelopathy or radiculopathy, lumbar region: Secondary | ICD-10-CM | POA: Diagnosis not present

## 2015-06-05 DIAGNOSIS — M4800 Spinal stenosis, site unspecified: Secondary | ICD-10-CM | POA: Diagnosis not present

## 2015-06-09 DIAGNOSIS — Z6832 Body mass index (BMI) 32.0-32.9, adult: Secondary | ICD-10-CM | POA: Diagnosis not present

## 2015-06-09 DIAGNOSIS — M545 Low back pain: Secondary | ICD-10-CM | POA: Diagnosis not present

## 2015-06-09 DIAGNOSIS — I10 Essential (primary) hypertension: Secondary | ICD-10-CM | POA: Diagnosis not present

## 2015-06-16 ENCOUNTER — Other Ambulatory Visit: Payer: Self-pay | Admitting: Geriatric Medicine

## 2015-06-16 DIAGNOSIS — R29898 Other symptoms and signs involving the musculoskeletal system: Secondary | ICD-10-CM

## 2015-06-17 DIAGNOSIS — R29898 Other symptoms and signs involving the musculoskeletal system: Secondary | ICD-10-CM | POA: Diagnosis not present

## 2015-06-17 DIAGNOSIS — R2689 Other abnormalities of gait and mobility: Secondary | ICD-10-CM | POA: Diagnosis not present

## 2015-06-19 ENCOUNTER — Encounter (HOSPITAL_COMMUNITY): Payer: Self-pay

## 2015-06-19 ENCOUNTER — Emergency Department (HOSPITAL_COMMUNITY)
Admission: EM | Admit: 2015-06-19 | Discharge: 2015-06-19 | Disposition: A | Discharge status: Home / Self Care | Payer: Commercial Managed Care - HMO | Attending: Emergency Medicine | Admitting: Emergency Medicine

## 2015-06-19 ENCOUNTER — Emergency Department (HOSPITAL_COMMUNITY): Payer: Commercial Managed Care - HMO

## 2015-06-19 DIAGNOSIS — M1990 Unspecified osteoarthritis, unspecified site: Secondary | ICD-10-CM | POA: Insufficient documentation

## 2015-06-19 DIAGNOSIS — K449 Diaphragmatic hernia without obstruction or gangrene: Secondary | ICD-10-CM | POA: Diagnosis not present

## 2015-06-19 DIAGNOSIS — R110 Nausea: Secondary | ICD-10-CM | POA: Insufficient documentation

## 2015-06-19 DIAGNOSIS — R42 Dizziness and giddiness: Secondary | ICD-10-CM | POA: Diagnosis not present

## 2015-06-19 DIAGNOSIS — Z9889 Other specified postprocedural states: Secondary | ICD-10-CM | POA: Insufficient documentation

## 2015-06-19 DIAGNOSIS — Z7902 Long term (current) use of antithrombotics/antiplatelets: Secondary | ICD-10-CM | POA: Diagnosis not present

## 2015-06-19 DIAGNOSIS — E119 Type 2 diabetes mellitus without complications: Secondary | ICD-10-CM | POA: Insufficient documentation

## 2015-06-19 DIAGNOSIS — R55 Syncope and collapse: Secondary | ICD-10-CM | POA: Diagnosis not present

## 2015-06-19 DIAGNOSIS — Z79899 Other long term (current) drug therapy: Secondary | ICD-10-CM | POA: Insufficient documentation

## 2015-06-19 DIAGNOSIS — K529 Noninfective gastroenteritis and colitis, unspecified: Secondary | ICD-10-CM | POA: Diagnosis not present

## 2015-06-19 DIAGNOSIS — E7800 Pure hypercholesterolemia, unspecified: Secondary | ICD-10-CM | POA: Insufficient documentation

## 2015-06-19 DIAGNOSIS — Z8709 Personal history of other diseases of the respiratory system: Secondary | ICD-10-CM | POA: Diagnosis not present

## 2015-06-19 DIAGNOSIS — K219 Gastro-esophageal reflux disease without esophagitis: Secondary | ICD-10-CM | POA: Diagnosis not present

## 2015-06-19 DIAGNOSIS — R61 Generalized hyperhidrosis: Secondary | ICD-10-CM | POA: Diagnosis not present

## 2015-06-19 DIAGNOSIS — E669 Obesity, unspecified: Secondary | ICD-10-CM | POA: Diagnosis not present

## 2015-06-19 DIAGNOSIS — Z8701 Personal history of pneumonia (recurrent): Secondary | ICD-10-CM | POA: Insufficient documentation

## 2015-06-19 DIAGNOSIS — R531 Weakness: Secondary | ICD-10-CM | POA: Diagnosis not present

## 2015-06-19 DIAGNOSIS — R1030 Lower abdominal pain, unspecified: Secondary | ICD-10-CM | POA: Diagnosis not present

## 2015-06-19 DIAGNOSIS — Z8673 Personal history of transient ischemic attack (TIA), and cerebral infarction without residual deficits: Secondary | ICD-10-CM | POA: Insufficient documentation

## 2015-06-19 DIAGNOSIS — Z87891 Personal history of nicotine dependence: Secondary | ICD-10-CM | POA: Diagnosis not present

## 2015-06-19 DIAGNOSIS — R404 Transient alteration of awareness: Secondary | ICD-10-CM | POA: Diagnosis not present

## 2015-06-19 DIAGNOSIS — R1032 Left lower quadrant pain: Secondary | ICD-10-CM | POA: Diagnosis present

## 2015-06-19 DIAGNOSIS — Z7984 Long term (current) use of oral hypoglycemic drugs: Secondary | ICD-10-CM | POA: Diagnosis not present

## 2015-06-19 DIAGNOSIS — I10 Essential (primary) hypertension: Secondary | ICD-10-CM | POA: Insufficient documentation

## 2015-06-19 LAB — COMPREHENSIVE METABOLIC PANEL
ALT: 22 U/L (ref 14–54)
ANION GAP: 12 (ref 5–15)
AST: 19 U/L (ref 15–41)
Albumin: 3.8 g/dL (ref 3.5–5.0)
Alkaline Phosphatase: 64 U/L (ref 38–126)
BUN: 32 mg/dL — ABNORMAL HIGH (ref 6–20)
CALCIUM: 9.2 mg/dL (ref 8.9–10.3)
CHLORIDE: 98 mmol/L — AB (ref 101–111)
CO2: 25 mmol/L (ref 22–32)
Creatinine, Ser: 1.01 mg/dL — ABNORMAL HIGH (ref 0.44–1.00)
GFR calc non Af Amer: 56 mL/min — ABNORMAL LOW (ref >60)
Glucose, Bld: 291 mg/dL — ABNORMAL HIGH (ref 65–99)
POTASSIUM: 3.9 mmol/L (ref 3.5–5.1)
SODIUM: 135 mmol/L (ref 135–145)
Total Bilirubin: 0.4 mg/dL (ref 0.3–1.2)
Total Protein: 7 g/dL (ref 6.5–8.1)

## 2015-06-19 LAB — CBC
HCT: 36.9 % (ref 36.0–46.0)
HEMOGLOBIN: 12.5 g/dL (ref 12.0–15.0)
MCH: 30 pg (ref 26.0–34.0)
MCHC: 33.9 g/dL (ref 30.0–36.0)
MCV: 88.5 fL (ref 78.0–100.0)
Platelets: 232 10*3/uL (ref 150–400)
RBC: 4.17 MIL/uL (ref 3.87–5.11)
RDW: 14.7 % (ref 11.5–15.5)
WBC: 7.4 10*3/uL (ref 4.0–10.5)

## 2015-06-19 LAB — URINALYSIS, ROUTINE W REFLEX MICROSCOPIC
Bilirubin Urine: NEGATIVE
Glucose, UA: 250 mg/dL — AB
HGB URINE DIPSTICK: NEGATIVE
Ketones, ur: NEGATIVE mg/dL
Nitrite: NEGATIVE
Protein, ur: NEGATIVE mg/dL
SPECIFIC GRAVITY, URINE: 1.014 (ref 1.005–1.030)
pH: 6 (ref 5.0–8.0)

## 2015-06-19 LAB — URINE MICROSCOPIC-ADD ON
Bacteria, UA: NONE SEEN
RBC / HPF: NONE SEEN RBC/hpf (ref 0–5)

## 2015-06-19 LAB — I-STAT TROPONIN, ED: TROPONIN I, POC: 0 ng/mL (ref 0.00–0.08)

## 2015-06-19 LAB — CBG MONITORING, ED: Glucose-Capillary: 268 mg/dL — ABNORMAL HIGH (ref 65–99)

## 2015-06-19 LAB — LIPASE, BLOOD: Lipase: 68 U/L — ABNORMAL HIGH (ref 11–51)

## 2015-06-19 MED ORDER — ONDANSETRON HCL 4 MG/2ML IJ SOLN
4.0000 mg | Freq: Once | INTRAMUSCULAR | Status: AC | PRN
  Administered 2015-06-19: 4 mg via INTRAVENOUS
  Filled 2015-06-19: qty 2

## 2015-06-19 MED ORDER — AZITHROMYCIN 250 MG PO TABS: 250.0000 mg | ORAL_TABLET | Freq: Every day | ORAL | Status: DC

## 2015-06-19 MED ORDER — IOHEXOL 300 MG/ML  SOLN
50.0000 mL | Freq: Once | INTRAMUSCULAR | Status: DC | PRN
  Administered 2015-06-19 (×2): 50 mL via ORAL
  Filled 2015-06-19 (×2): qty 50

## 2015-06-19 MED ORDER — IOHEXOL 300 MG/ML  SOLN
100.0000 mL | Freq: Once | INTRAMUSCULAR | Status: AC | PRN
Start: 2015-06-19 — End: 2015-06-19
  Administered 2015-06-19: 100 mL via INTRAVENOUS

## 2015-06-19 MED ORDER — METRONIDAZOLE 500 MG PO TABS: 500.0000 mg | ORAL_TABLET | Freq: Three times a day (TID) | ORAL | Status: DC

## 2015-06-19 MED ORDER — HYDROCODONE-ACETAMINOPHEN 5-325 MG PO TABS: 1.0000 | ORAL_TABLET | Freq: Four times a day (QID) | ORAL | Status: DC | PRN

## 2015-06-19 MED ORDER — ONDANSETRON 8 MG PO TBDP: ORAL_TABLET | ORAL | Status: DC

## 2015-06-19 MED ORDER — FENTANYL CITRATE (PF) 100 MCG/2ML IJ SOLN
50.0000 ug | INTRAMUSCULAR | Status: DC | PRN
Start: 2015-06-19 — End: 2015-06-19
  Administered 2015-06-19 (×2): 50 ug via INTRAVENOUS
  Filled 2015-06-19 (×2): qty 2

## 2015-06-19 NOTE — ED Notes (Signed)
Pt also c/o of generalized weakness legs for several weeks. Seen by Neuro for this complaints. Pt has Neuro conduction test this AM when sudden onset of nausea and diaphoretic symptoms began. Pt denies CP/SOB then and at present. Pt states placed on prednisone for these symptoms. Pt has completed this medication.

## 2015-06-19 NOTE — Discharge Instructions (Signed)
1. Medications: zofran, vicodin, flagyl, azithromycin, usual home medications 2. Treatment: rest, drink plenty of fluids, advance diet slowly 3. Follow Up: Please followup with your primary doctor in 2 days for discussion of your diagnoses and further evaluation after today's visit; if you do not have a primary care doctor use the resource guide provided to find one; Please return to the ER for persistent vomiting, high fevers or worsening symptoms   Colitis Colitis is inflammation of the colon. Colitis may last a short time (acute) or it may last a long time (chronic). CAUSES This condition may be caused by:  Viruses.  Bacteria.  Reactions to medicine.  Certain autoimmune diseases, such as Crohn disease or ulcerative colitis. SYMPTOMS Symptoms of this condition include:  Diarrhea.  Passing bloody or tarry stool.  Pain.  Fever.  Vomiting.  Tiredness (fatigue).  Weight loss.  Bloating.  Sudden increase in abdominal pain.  Having fewer bowel movements than usual. DIAGNOSIS This condition is diagnosed with a stool test or a blood test. You may also have other tests, including X-rays, a CT scan, or a colonoscopy. TREATMENT Treatment may include:  Resting the bowel. This involves not eating or drinking for a period of time.  Fluids that are given through an IV tube.  Medicine for pain and diarrhea.  Antibiotic medicines.  Cortisone medicines.  Surgery. HOME CARE INSTRUCTIONS Eating and Drinking  Follow instructions from your health care provider about eating or drinking restrictions.  Drink enough fluid to keep your urine clear or pale yellow.  Work with a dietitian to determine which foods cause your condition to flare up.  Avoid foods that cause flare-ups.  Eat a well-balanced diet. Medicines  Take over-the-counter and prescription medicines only as told by your health care provider.  If you were prescribed an antibiotic medicine, take it as told by  your health care provider. Do not stop taking the antibiotic even if you start to feel better. General Instructions  Keep all follow-up visits as told by your health care provider. This is important. SEEK MEDICAL CARE IF:  Your symptoms do not go away.  You develop new symptoms. SEEK IMMEDIATE MEDICAL CARE IF:  You have a fever that does not go away with treatment.  You develop chills.  You have extreme weakness, fainting, or dehydration.  You have repeated vomiting.  You develop severe pain in your abdomen.  You pass bloody or tarry stool.   This information is not intended to replace advice given to you by your health care provider. Make sure you discuss any questions you have with your health care provider.   Document Released: 07/22/2004 Document Revised: 03/05/2015 Document Reviewed: 10/07/2014 Elsevier Interactive Patient Education [employer].

## 2015-06-19 NOTE — ED Notes (Signed)
Per EMS, Pt, from home, c/o generalized abdominal pain, nausea, and weakness x 33mins (1007).  Pain score 8/10.  Denies emesis and diarrhea.  Pt has not taken anything for symptoms.  Hx of DM, diverticulosis, and stroke.

## 2015-06-19 NOTE — ED Notes (Signed)
ED PA at bedside

## 2015-06-19 NOTE — ED Provider Notes (Signed)
CSN: [id number]     Arrival date & time 06/19/15  1000 History   First MD Initiated Contact with Patient 06/19/15 1002     Chief Complaint  Patient presents with  . Abdominal Pain  . Nausea     (Consider location/radiation/quality/duration/timing/severity/associated sxs/prior Treatment) The history is provided by the patient and medical records. No language interpreter was used.     Kathleen Oliver is a 69 y.o. female  with a hx of colitis, perforated bowel, HTN, GERD, asthma, NIDDM, CVA presents to the Emergency Department complaining of sudden, waxing and waning, progressively worsening bilateral lower abd pain onset 9:30am. Associated symptoms include nausea, diaphoresis, near syncope.  Pt reports hospitalization Feb for colitis without surgical intervention.  Pt reports last abd surgery was hernia repair in 2001.  No aggravating or alleviating factors.  Pt denies fever, chills, headache, neck pain, chest pain, SOB, vomiting, diarrhea, melena, hematochezia.      Past Medical History  Diagnosis Date  . GERD (gastroesophageal reflux disease)   . Hypertension   . Hypercholesteremia   . Obesity   . HTN (hypertension)   . Osteoarthrosis   . Complication of anesthesia 1999    "had 3 seizures & ended up in ICU days after I'd had anesthesia"   . Bronchial asthma   . Pneumonia 2000's X 1  . Chronic bronchitis ([place])     "numerous years; not q yr"   . Type II diabetes mellitus ([place])   . History of hiatal hernia   . Stroke ([place])     /MRI from 1999;; showed I'd had had several small ones"-no residual effects.  . Diverticulosis of colon   . Seizures ([place]) 1999 X 3    "days after anesthesia"- no further issues  . Dysrhythmia     PVC's   Past Surgical History  Procedure Laterality Date  . Esophageal dilation  2000's X 1  . Bladder suspension  2000's  . Tonsillectomy and adenoidectomy  1950's  . Appendectomy  ~ 2000  . Colostomy  ~ 2000  . Colostomy reversal  ~ 2000    "only needed  it 9 months"  . Cholecystectomy open  1984  . Abdominal hernia repair  2001 X 2  . Carpal tunnel release Bilateral ~ 2003  . Hernia repair    . Cataract extraction w/ intraocular lens  implant, bilateral Bilateral ~ 2012  . Total abdominal hysterectomy  2004  . Cardiac catheterization  1990's? ; 11/2009  . Trigger finger release Right 10/2009    "ring finger"  . Trigger finger release Right 04/2014    "index & pinky"  . Colectomy  ~ 2000    "perforated colon due to diverticulitis"  . Shoulder arthroscopy w/ rotator cuff repair Right     x2 -scope '07  . Colonoscopy with propofol N/A 02/18/2015    Procedure: COLONOSCOPY WITH PROPOFOL;  Surgeon: Garlan Fair, MD;  Location: [facility];  Service: Endoscopy;  Laterality: N/A;   Family History  Problem Relation Age of Onset  . Diverticulitis Mother   . Hypertension Mother   . Heart attack Father   . Heart attack Brother   . Diabetes Brother   . Hypertension Sister    Social History  Substance Use Topics  . Smoking status: Former Smoker -- 2.00 packs/day for 20 years    Types: Cigarettes    Quit date: 02/15/1982  . Smokeless tobacco: Never Used  . Alcohol Use: Yes     Comment: "stopped  drinking in1984"   OB History    No data available     Review of Systems  Constitutional: Positive for diaphoresis. Negative for fever, appetite change, fatigue and unexpected weight change.  HENT: Negative for mouth sores.   Eyes: Negative for visual disturbance.  Respiratory: Negative for cough, chest tightness, shortness of breath and wheezing.   Cardiovascular: Negative for chest pain.  Gastrointestinal: Positive for nausea and abdominal pain. Negative for vomiting, diarrhea and constipation.  Endocrine: Negative for polydipsia, polyphagia and polyuria.  Genitourinary: Negative for dysuria, urgency, frequency and hematuria.  Musculoskeletal: Negative for back pain and neck stiffness.  Skin: Negative for rash.  Allergic/Immunologic:  Negative for immunocompromised state.  Neurological: Positive for light-headedness. Negative for syncope and headaches.  Hematological: Does not bruise/bleed easily.  Psychiatric/Behavioral: Negative for sleep disturbance. The patient is not nervous/anxious.       Allergies  Cinobac; Codeine; Penicillins; and Tizanidine  Home Medications   Prior to Admission medications   Medication Sig Start Date End Date Taking? Authorizing Provider  acetaminophen (TYLENOL) 500 MG tablet Take 500 mg by mouth every 6 (six) hours as needed for headache.   Yes Historical Provider, MD  albuterol (PROVENTIL HFA;VENTOLIN HFA) 108 (90 BASE) MCG/ACT inhaler Inhale 2 puffs into the lungs every 6 (six) hours as needed for wheezing or shortness of breath.   Yes Historical Provider, MD  amLODipine (NORVASC) 10 MG tablet Take 10 mg by mouth every morning.    Yes Historical Provider, MD  Cholecalciferol (VITAMIN D) 2000 UNITS CAPS Take 2,000 Units by mouth daily.   Yes Historical Provider, MD  cholestyramine light (PREVALITE) 4 G packet Take 4 g by mouth daily. Between lunch and supper.   Yes Historical Provider, MD  clopidogrel (PLAVIX) 75 MG tablet Take 75 mg by mouth at bedtime.   Yes Historical Provider, MD  famotidine (PEPCID) 10 MG tablet Take 10 mg by mouth daily with lunch.    Yes Historical Provider, MD  FLUVIRIN PRESERVATIVE FREE 0.5 ML SUSY Inject 1 Dose as directed once. 03/22/15  Yes Historical Provider, MD  hydrochlorothiazide (HYDRODIURIL) 25 MG tablet Take 25 mg by mouth every morning.   Yes Historical Provider, MD  latanoprost (XALATAN) 0.005 % ophthalmic solution Place 1 drop into both eyes at bedtime.  05/15/13  Yes Historical Provider, MD  lisinopril (PRINIVIL,ZESTRIL) 10 MG tablet Take 10 mg by mouth every morning.    Yes Historical Provider, MD  metFORMIN (GLUCOPHAGE-XR) 500 MG 24 hr tablet Take 500 mg by mouth every evening.   Yes Historical Provider, MD  Naphazoline-Pheniramine (OPCON-A OP)  Apply 1 drop to eye 2 (two) times daily as needed (allergies.).   Yes Historical Provider, MD  Neomycin-Bacitracin-Polymyxin (HCA TRIPLE ANTIBIOTIC OINTMENT EX) Apply 1 application topically daily as needed (wounds).  04/08/15  Yes Historical Provider, MD  Simethicone (GAS-X PO) Take 1 capsule by mouth daily as needed (abdominal pain).   Yes Historical Provider, MD  sitaGLIPtin (JANUVIA) 100 MG tablet Take 100 mg by mouth daily.   Yes Historical Provider, MD  sodium chloride (OCEAN) 0.65 % SOLN nasal spray Place 1-2 sprays into both nostrils daily as needed for congestion.   Yes Historical Provider, MD  traMADol (ULTRAM) 50 MG tablet Take 50 mg by mouth every 6 (six) hours as needed (Back pain.).    Yes Historical Provider, MD  azithromycin (ZITHROMAX) 250 MG tablet Take 1 tablet (250 mg total) by mouth daily. Take first 2 tablets together, then 1 every day until  finished. 06/19/15   Jag Lenz, PA-C  HYDROcodone-acetaminophen (NORCO/VICODIN) 5-325 MG tablet Take 1-2 tablets by mouth every 6 (six) hours as needed for moderate pain or severe pain. 06/19/15   Aubrei Bouchie, PA-C  metroNIDAZOLE (FLAGYL) 500 MG tablet Take 1 tablet (500 mg total) by mouth 3 (three) times daily. 06/19/15   Ader Fritze, PA-C  ondansetron (ZOFRAN ODT) 8 MG disintegrating tablet 8mg  ODT q4 hours prn nausea 06/19/15   Kasim Mccorkle, PA-C  predniSONE (STERAPRED UNI-PAK 21 TAB) 10 MG (21) TBPK tablet Take 1-6 tablets by mouth daily. Reported on 06/19/2015 06/05/15   Historical Provider, MD   BP 104/62 mmHg  Pulse 67  Temp(Src) 97.8 F (36.6 C) (Oral)  Resp 16  Ht 5\' 1"  (1.549 m)  Wt 80.74 kg  BMI 33.65 kg/m2  SpO2 95% Physical Exam  Constitutional: She appears well-developed and well-nourished. No distress.  Awake, alert, nontoxic appearance  HENT:  Head: Normocephalic and atraumatic.  Mouth/Throat: Oropharynx is clear and moist. No oropharyngeal exudate.  Eyes: Conjunctivae are normal. No  scleral icterus.  Neck: Normal range of motion. Neck supple.  Cardiovascular: Normal rate, regular rhythm, normal heart sounds and intact distal pulses.   Pulmonary/Chest: Effort normal and breath sounds normal. No respiratory distress. She has no wheezes.  Equal chest expansion  Abdominal: Soft. Bowel sounds are normal. She exhibits no distension and no mass. There is tenderness in the right lower quadrant, suprapubic area and left lower quadrant. There is guarding. There is no rebound and no CVA tenderness.  Left lower quadrant, right lower and suprapubic abdominal pain with guarding  Musculoskeletal: Normal range of motion. She exhibits no edema.  Neurological: She is alert.  Speech is clear and goal oriented Moves extremities without ataxia  Skin: Skin is warm and dry. She is not diaphoretic. No pallor.  Skin is warm and dry No pallor  Psychiatric: She has a normal mood and affect. Her speech is normal and behavior is normal.  Nursing note and vitals reviewed.   ED Course  Procedures (including critical care time) Labs Review Labs Reviewed  LIPASE, BLOOD - Abnormal; Notable for the following:    Lipase 68 (*)    All other components within normal limits  COMPREHENSIVE METABOLIC PANEL - Abnormal; Notable for the following:    Chloride 98 (*)    Glucose, Bld 291 (*)    BUN 32 (*)    Creatinine, Ser 1.01 (*)    GFR calc non Af Amer 56 (*)    All other components within normal limits  URINALYSIS, ROUTINE W REFLEX MICROSCOPIC (NOT AT [facility]) - Abnormal; Notable for the following:    Glucose, UA 250 (*)    Leukocytes, UA TRACE (*)    All other components within normal limits  URINE MICROSCOPIC-ADD ON - Abnormal; Notable for the following:    Squamous Epithelial / LPF 0-5 (*)    Casts HYALINE CASTS (*)    All other components within normal limits  CBG MONITORING, ED - Abnormal; Notable for the following:    Glucose-Capillary 268 (*)    All other components within normal limits   CBC  CBG MONITORING, ED  I-STAT TROPOININ, ED    Imaging Review Ct Abdomen Pelvis W Contrast  06/19/2015  CLINICAL DATA:  Right lower quadrant abdominal pain, nausea and weakness for 1 day. Initial encounter. EXAM: CT ABDOMEN AND PELVIS WITH CONTRAST TECHNIQUE: Multidetector CT imaging of the abdomen and pelvis was performed using the standard protocol following bolus  administration of intravenous contrast. CONTRAST:  100 mL OMNIPAQUE IOHEXOL 300 MG/ML  SOLN COMPARISON:  CT abdomen and pelvis 08/22/2014. FINDINGS: The lung bases are clear. No pleural or pericardial effusion. Small hiatal hernia is noted. The gallbladder has been removed. Tiny hypoattenuating lesion in the right lobe of the liver on image 22 cannot be definitively characterized but likely represents a cyst. The spleen, adrenal glands, pancreas and biliary tree appear normal. Parapelvic renal cysts are noted. The kidneys are otherwise unremarkable. Marked laxity of the anterior abdominal wall is again seen. The patient is status post prior ventral hernia repair. The appendix is not visualized and may have been removed. The small and large bowel are unremarkable. Scattered aortoiliac atherosclerosis without aneurysm is identified. The patient is status post hysterectomy. Urinary bladder appears normal. There is no lymphadenopathy or fluid collection. No focal bony abnormality is identified. IMPRESSION: No acute abnormality or finding to explain the patient's symptoms. Small hiatal hernia. Status post prior ventral hernia repair with a markedly lax anterior abdominal wall, unchanged. Aortoiliac atherosclerosis without aneurysm. Status post cholecystectomy. Electronically Signed   By: Inge Rise M.D.   On: 06/19/2015 12:40   I have personally reviewed and evaluated these images and lab results as part of my medical decision-making.   EKG Interpretation   Date/Time:  Thursday June 19 2015 10:18:42 EST Ventricular Rate:  67 PR  Interval:  215 QRS Duration: 112 QT Interval:  419 QTC Calculation: 442 R Axis:   76 Text Interpretation:  Sinus rhythm Borderline prolonged PR interval  Borderline intraventricular conduction delay Sinus rhythm Non-specific  intra-ventricular conduction delay No significant change since last  tracing Abnormal ekg Confirmed by Carmin Muskrat  MD [phone]) on  06/19/2015 10:25:43 AM      MDM   Final diagnoses:  Colitis  Lower abdominal pain   Kathleen Oliver presents with lower abdominal pain, near syncope, fecal urgency onset this morning. Patient has history of recurrent colitis but no history of chronic colitis. She also reports a history of bowel perforation. Labs and imaging are reassuring.  No leukocytosis. Slight elevation in serum creatinine to 1.01. Patient given fluid bolus.  Negative troponin and nonischemic EKG.  CT scan shows no acute abnormality including no abscess or bowel perforation. Patient patient symptoms and long-standing history of colitis I do believe that she is in the early stages. Will treat with antibiotics, pain control and nausea medication. Patient is tolerating by mouth here in the emergency department resting comfortably. Repeat exam her abdomen remains soft and she has no guarding. She is afebrile without tachycardia or hypotension. No evidence of SIRS or sepsis.   The patient was discussed with and seen by Dr. Vanita Panda who agrees with the treatment plan.  3:41 PM Consult with pharmacy due to Cipro allergy with recommendation of azithromycin and metronidazole. Patient will be discharged home with these medications.  BP 104/62 mmHg  Pulse 67  Temp(Src) 97.8 F (36.6 C) (Oral)  Resp 16  Ht 5\' 1"  (1.549 m)  Wt 80.74 kg  BMI 33.65 kg/m2  SpO2 95%   Abigail Butts, PA-C 06/19/15 1543  Carmin Muskrat, MD 06/19/15 1549

## 2015-06-19 NOTE — ED Notes (Signed)
Bed: [id number] Expected date:  Expected time:  Means of arrival:  Comments: 68 y/o F abd pain

## 2015-06-26 ENCOUNTER — Ambulatory Visit
Admission: RE | Admit: 2015-06-26 | Discharge: 2015-06-26 | Disposition: A | Discharge status: Home / Self Care | Payer: Commercial Managed Care - HMO | Source: Ambulatory Visit | Attending: Geriatric Medicine | Admitting: Geriatric Medicine

## 2015-06-26 DIAGNOSIS — R29898 Other symptoms and signs involving the musculoskeletal system: Secondary | ICD-10-CM

## 2015-06-26 DIAGNOSIS — M545 Low back pain: Secondary | ICD-10-CM | POA: Diagnosis not present

## 2015-07-10 DIAGNOSIS — M5126 Other intervertebral disc displacement, lumbar region: Secondary | ICD-10-CM | POA: Diagnosis not present

## 2015-07-10 DIAGNOSIS — G629 Polyneuropathy, unspecified: Secondary | ICD-10-CM | POA: Diagnosis not present

## 2015-07-10 DIAGNOSIS — G5601 Carpal tunnel syndrome, right upper limb: Secondary | ICD-10-CM | POA: Diagnosis not present

## 2015-07-16 ENCOUNTER — Other Ambulatory Visit: Payer: Self-pay | Admitting: Geriatric Medicine

## 2015-07-16 DIAGNOSIS — E041 Nontoxic single thyroid nodule: Secondary | ICD-10-CM | POA: Diagnosis not present

## 2015-07-16 DIAGNOSIS — I10 Essential (primary) hypertension: Secondary | ICD-10-CM | POA: Diagnosis not present

## 2015-07-16 DIAGNOSIS — Z79899 Other long term (current) drug therapy: Secondary | ICD-10-CM | POA: Diagnosis not present

## 2015-07-16 DIAGNOSIS — R202 Paresthesia of skin: Secondary | ICD-10-CM | POA: Diagnosis not present

## 2015-07-16 DIAGNOSIS — E119 Type 2 diabetes mellitus without complications: Secondary | ICD-10-CM | POA: Diagnosis not present

## 2015-07-16 DIAGNOSIS — L299 Pruritus, unspecified: Secondary | ICD-10-CM | POA: Diagnosis not present

## 2015-07-16 DIAGNOSIS — Z7984 Long term (current) use of oral hypoglycemic drugs: Secondary | ICD-10-CM | POA: Diagnosis not present

## 2015-07-21 ENCOUNTER — Ambulatory Visit
Admission: RE | Admit: 2015-07-21 | Discharge: 2015-07-21 | Disposition: A | Discharge status: Home / Self Care | Payer: Commercial Managed Care - HMO | Source: Ambulatory Visit | Attending: Geriatric Medicine | Admitting: Geriatric Medicine

## 2015-07-21 DIAGNOSIS — E041 Nontoxic single thyroid nodule: Secondary | ICD-10-CM | POA: Diagnosis not present

## 2015-07-23 ENCOUNTER — Other Ambulatory Visit: Payer: Self-pay | Admitting: Geriatric Medicine

## 2015-07-23 DIAGNOSIS — E041 Nontoxic single thyroid nodule: Secondary | ICD-10-CM

## 2015-07-24 DIAGNOSIS — G629 Polyneuropathy, unspecified: Secondary | ICD-10-CM | POA: Diagnosis not present

## 2015-07-24 DIAGNOSIS — Z6832 Body mass index (BMI) 32.0-32.9, adult: Secondary | ICD-10-CM | POA: Diagnosis not present

## 2015-07-31 ENCOUNTER — Other Ambulatory Visit (HOSPITAL_COMMUNITY)
Admission: RE | Admit: 2015-07-31 | Discharge: 2015-07-31 | Disposition: A | Discharge status: Home / Self Care | Payer: Commercial Managed Care - HMO | Source: Ambulatory Visit | Attending: Physician Assistant | Admitting: Physician Assistant

## 2015-07-31 ENCOUNTER — Ambulatory Visit
Admission: RE | Admit: 2015-07-31 | Discharge: 2015-07-31 | Disposition: A | Discharge status: Home / Self Care | Payer: Commercial Managed Care - HMO | Source: Ambulatory Visit | Attending: Geriatric Medicine | Admitting: Geriatric Medicine

## 2015-07-31 DIAGNOSIS — E041 Nontoxic single thyroid nodule: Secondary | ICD-10-CM

## 2015-07-31 NOTE — Procedures (Signed)
Using direct ultrasound guidance, 4 passes were made using needles into the nodule within the right lobe of the thyroid.   Ultrasound was used to confirm needle placements on all occasions.   Specimens were sent to Pathology for analysis.   Amani Marseille S Khrystian Schauf PA-C 04/22/2015 1:43 PM

## 2015-08-01 ENCOUNTER — Emergency Department (HOSPITAL_COMMUNITY): Payer: Commercial Managed Care - HMO

## 2015-08-01 ENCOUNTER — Encounter (HOSPITAL_COMMUNITY): Payer: Self-pay

## 2015-08-01 ENCOUNTER — Emergency Department (HOSPITAL_COMMUNITY)
Admission: EM | Admit: 2015-08-01 | Discharge: 2015-08-01 | Disposition: A | Discharge status: Home / Self Care | Payer: Commercial Managed Care - HMO | Attending: Emergency Medicine | Admitting: Emergency Medicine

## 2015-08-01 DIAGNOSIS — Z7902 Long term (current) use of antithrombotics/antiplatelets: Secondary | ICD-10-CM | POA: Diagnosis not present

## 2015-08-01 DIAGNOSIS — Z9889 Other specified postprocedural states: Secondary | ICD-10-CM | POA: Insufficient documentation

## 2015-08-01 DIAGNOSIS — M1990 Unspecified osteoarthritis, unspecified site: Secondary | ICD-10-CM | POA: Diagnosis not present

## 2015-08-01 DIAGNOSIS — E669 Obesity, unspecified: Secondary | ICD-10-CM | POA: Diagnosis not present

## 2015-08-01 DIAGNOSIS — E119 Type 2 diabetes mellitus without complications: Secondary | ICD-10-CM | POA: Insufficient documentation

## 2015-08-01 DIAGNOSIS — Z79899 Other long term (current) drug therapy: Secondary | ICD-10-CM | POA: Diagnosis not present

## 2015-08-01 DIAGNOSIS — K429 Umbilical hernia without obstruction or gangrene: Secondary | ICD-10-CM | POA: Diagnosis not present

## 2015-08-01 DIAGNOSIS — J45909 Unspecified asthma, uncomplicated: Secondary | ICD-10-CM | POA: Insufficient documentation

## 2015-08-01 DIAGNOSIS — Z7984 Long term (current) use of oral hypoglycemic drugs: Secondary | ICD-10-CM | POA: Diagnosis not present

## 2015-08-01 DIAGNOSIS — E7800 Pure hypercholesterolemia, unspecified: Secondary | ICD-10-CM | POA: Insufficient documentation

## 2015-08-01 DIAGNOSIS — Z8701 Personal history of pneumonia (recurrent): Secondary | ICD-10-CM | POA: Diagnosis not present

## 2015-08-01 DIAGNOSIS — K219 Gastro-esophageal reflux disease without esophagitis: Secondary | ICD-10-CM | POA: Diagnosis not present

## 2015-08-01 DIAGNOSIS — I10 Essential (primary) hypertension: Secondary | ICD-10-CM | POA: Diagnosis not present

## 2015-08-01 DIAGNOSIS — Z8673 Personal history of transient ischemic attack (TIA), and cerebral infarction without residual deficits: Secondary | ICD-10-CM | POA: Insufficient documentation

## 2015-08-01 DIAGNOSIS — Z880 Allergy status to penicillin: Secondary | ICD-10-CM | POA: Insufficient documentation

## 2015-08-01 DIAGNOSIS — K420 Umbilical hernia with obstruction, without gangrene: Secondary | ICD-10-CM | POA: Diagnosis not present

## 2015-08-01 DIAGNOSIS — Z87891 Personal history of nicotine dependence: Secondary | ICD-10-CM | POA: Insufficient documentation

## 2015-08-01 DIAGNOSIS — L03311 Cellulitis of abdominal wall: Secondary | ICD-10-CM | POA: Insufficient documentation

## 2015-08-01 DIAGNOSIS — R109 Unspecified abdominal pain: Secondary | ICD-10-CM | POA: Diagnosis present

## 2015-08-01 LAB — CBC WITH DIFFERENTIAL/PLATELET
Basophils Absolute: 0 10*3/uL (ref 0.0–0.1)
Basophils Relative: 0 %
EOS PCT: 3 %
Eosinophils Absolute: 0.2 10*3/uL (ref 0.0–0.7)
HCT: 38.7 % (ref 36.0–46.0)
HEMOGLOBIN: 12.9 g/dL (ref 12.0–15.0)
LYMPHS ABS: 1.7 10*3/uL (ref 0.7–4.0)
LYMPHS PCT: 22 %
MCH: 29.5 pg (ref 26.0–34.0)
MCHC: 33.3 g/dL (ref 30.0–36.0)
MCV: 88.6 fL (ref 78.0–100.0)
MONO ABS: 0.5 10*3/uL (ref 0.1–1.0)
MONOS PCT: 7 %
Neutro Abs: 5.2 10*3/uL (ref 1.7–7.7)
Neutrophils Relative %: 68 %
PLATELETS: 255 10*3/uL (ref 150–400)
RBC: 4.37 MIL/uL (ref 3.87–5.11)
RDW: 14.4 % (ref 11.5–15.5)
WBC: 7.7 10*3/uL (ref 4.0–10.5)

## 2015-08-01 LAB — COMPREHENSIVE METABOLIC PANEL
ALBUMIN: 4.1 g/dL (ref 3.5–5.0)
ALT: 18 U/L (ref 14–54)
AST: 21 U/L (ref 15–41)
Alkaline Phosphatase: 63 U/L (ref 38–126)
Anion gap: 11 (ref 5–15)
BUN: 18 mg/dL (ref 6–20)
CALCIUM: 9.9 mg/dL (ref 8.9–10.3)
CO2: 28 mmol/L (ref 22–32)
CREATININE: 0.88 mg/dL (ref 0.44–1.00)
Chloride: 103 mmol/L (ref 101–111)
GFR calc non Af Amer: >60 mL/min (ref >60)
GLUCOSE: 123 mg/dL — AB (ref 65–99)
Potassium: 4 mmol/L (ref 3.5–5.1)
SODIUM: 142 mmol/L (ref 135–145)
Total Bilirubin: 0.2 mg/dL — ABNORMAL LOW (ref 0.3–1.2)
Total Protein: 7.8 g/dL (ref 6.5–8.1)

## 2015-08-01 LAB — I-STAT CG4 LACTIC ACID, ED: LACTIC ACID, VENOUS: 1.27 mmol/L (ref 0.5–2.0)

## 2015-08-01 MED ORDER — MORPHINE SULFATE (PF) 4 MG/ML IV SOLN
4.0000 mg | Freq: Once | INTRAVENOUS | Status: AC
  Administered 2015-08-01: 4 mg via INTRAVENOUS
  Filled 2015-08-01: qty 1

## 2015-08-01 MED ORDER — DOXYCYCLINE HYCLATE 100 MG PO CAPS
100.0000 mg | ORAL_CAPSULE | Freq: Two times a day (BID) | ORAL | Status: DC
Start: 2015-08-01 — End: 2018-02-06

## 2015-08-01 MED ORDER — IOHEXOL 300 MG/ML  SOLN
100.0000 mL | Freq: Once | INTRAMUSCULAR | Status: AC | PRN
  Administered 2015-08-01: 100 mL via INTRAVENOUS

## 2015-08-01 MED ORDER — IOHEXOL 300 MG/ML  SOLN
50.0000 mL | Freq: Once | INTRAMUSCULAR | Status: AC | PRN
  Administered 2015-08-01: 50 mL via ORAL

## 2015-08-01 MED ORDER — ONDANSETRON HCL 4 MG/2ML IJ SOLN
4.0000 mg | Freq: Once | INTRAMUSCULAR | Status: AC
  Administered 2015-08-01: 4 mg via INTRAVENOUS
  Filled 2015-08-01: qty 2

## 2015-08-01 MED ORDER — ONDANSETRON HCL 4 MG PO TABS: 4.0000 mg | ORAL_TABLET | Freq: Three times a day (TID) | ORAL | Status: DC | PRN

## 2015-08-01 NOTE — ED Notes (Signed)
Pt being sent over by Dr. Felipa Eth for possible incarcerated umbilical hernia.  He states that he will type up a note in epic about this patient.

## 2015-08-01 NOTE — Discharge Instructions (Signed)
Please return without fail for worsening symptoms including fevers, vomiting unable to keep down food or fluids, worsening abdominal pain, increased redness or swelling around the site of your skin infection despite antibiotics.  Cellulitis Cellulitis is an infection of the skin and the tissue beneath it. The infected area is usually red and tender. Cellulitis occurs most often in the arms and lower legs.  CAUSES  Cellulitis is caused by bacteria that enter the skin through cracks or cuts in the skin. The most common types of bacteria that cause cellulitis are staphylococci and streptococci. SIGNS AND SYMPTOMS   Redness and warmth.  Swelling.  Tenderness or pain.  Fever. DIAGNOSIS  Your health care provider can usually determine what is wrong based on a physical exam. Blood tests may also be done. TREATMENT  Treatment usually involves taking an antibiotic medicine. HOME CARE INSTRUCTIONS   Take your antibiotic medicine as directed by your health care provider. Finish the antibiotic even if you start to feel better.  Keep the infected arm or leg elevated to reduce swelling.  Apply a warm cloth to the affected area up to 4 times per day to relieve pain.  Take medicines only as directed by your health care provider.  Keep all follow-up visits as directed by your health care provider. SEEK MEDICAL CARE IF:   You notice red streaks coming from the infected area.  Your red area gets larger or turns dark in color.  Your bone or joint underneath the infected area becomes painful after the skin has healed.  Your infection returns in the same area or another area.  You notice a swollen bump in the infected area.  You develop new symptoms.  You have a fever. SEEK IMMEDIATE MEDICAL CARE IF:   You feel very sleepy.  You develop vomiting or diarrhea.  You have a general ill feeling (malaise) with muscle aches and pains.   This information is not intended to replace advice given  to you by your health care provider. Make sure you discuss any questions you have with your health care provider.   Document Released: 03/24/2005 Document Revised: 03/05/2015 Document Reviewed: 08/30/2011 Elsevier Interactive Patient Education [employer].

## 2015-08-01 NOTE — ED Notes (Signed)
Liu at bedside. 

## 2015-08-01 NOTE — ED Provider Notes (Signed)
CSN: [id number]     Arrival date & time 08/01/15  1140 History   First MD Initiated Contact with Patient 08/01/15 1145     Chief Complaint  Patient presents with  . Umbilical Hernia     (Consider location/radiation/quality/duration/timing/severity/associated sxs/prior Treatment) HPI 70 year old female who presents with concern for incarcerated hernia. History of diabetes and multiple abdominal surgeries including perforated diverticulitis status post partial colectomy, appendectomy, cholecystectomy, multiple hernia repairs. States that 1 week ago noticed a knot around her umbilicus. Has not been trying to reduce this, but noted some mild erythema that has developed over this time and increased pain. Noticed that then she was unable to push back into and was developing local abdominal pain. Prior history of colitis with alternating constipation and diarrhea, which she states has been stable. Denies any associating fevers, chills, melena or hematochezia, vomiting or nausea. Has noted mild distention of her abdomen. Was seen by her primary care physician today and sent to ED for concern of incarcerated umbilical hernia.   Past Medical History  Diagnosis Date  . GERD (gastroesophageal reflux disease)   . Hypertension   . Hypercholesteremia   . Obesity   . HTN (hypertension)   . Osteoarthrosis   . Complication of anesthesia 1999    "had 3 seizures & ended up in ICU days after I'd had anesthesia"   . Bronchial asthma   . Pneumonia 2000's X 1  . Chronic bronchitis ([place])     "numerous years; not q yr"   . Type II diabetes mellitus ([place])   . History of hiatal hernia   . Stroke ([place])     /MRI from 1999;; showed I'd had had several small ones"-no residual effects.  . Diverticulosis of colon   . Seizures ([place]) 1999 X 3    "days after anesthesia"- no further issues  . Dysrhythmia     PVC's   Past Surgical History  Procedure Laterality Date  . Esophageal dilation  2000's X 1  . Bladder  suspension  2000's  . Tonsillectomy and adenoidectomy  1950's  . Appendectomy  ~ 2000  . Colostomy  ~ 2000  . Colostomy reversal  ~ 2000    "only needed it 9 months"  . Cholecystectomy open  1984  . Abdominal hernia repair  2001 X 2  . Carpal tunnel release Bilateral ~ 2003  . Hernia repair    . Cataract extraction w/ intraocular lens  implant, bilateral Bilateral ~ 2012  . Total abdominal hysterectomy  2004  . Cardiac catheterization  1990's? ; 11/2009  . Trigger finger release Right 10/2009    "ring finger"  . Trigger finger release Right 04/2014    "index & pinky"  . Colectomy  ~ 2000    "perforated colon due to diverticulitis"  . Shoulder arthroscopy w/ rotator cuff repair Right     x2 -scope '07  . Colonoscopy with propofol N/A 02/18/2015    Procedure: COLONOSCOPY WITH PROPOFOL;  Surgeon: Garlan Fair, MD;  Location: [facility];  Service: Endoscopy;  Laterality: N/A;  . Biopsy thyroid     Family History  Problem Relation Age of Onset  . Diverticulitis Mother   . Hypertension Mother   . Heart attack Father   . Heart attack Brother   . Diabetes Brother   . Hypertension Sister    Social History  Substance Use Topics  . Smoking status: Former Smoker -- 2.00 packs/day for 20 years    Types: Cigarettes  Quit date: 02/15/1982  . Smokeless tobacco: Never Used  . Alcohol Use: Yes     Comment: "stopped drinking in1984"   OB History    No data available     Review of Systems 10/14 systems reviewed and are negative other than those stated in the HPI    Allergies  Cinobac; Codeine; Penicillins; and Tizanidine  Home Medications   Prior to Admission medications   Medication Sig Start Date End Date Taking? Authorizing Provider  acetaminophen (TYLENOL) 500 MG tablet Take 500 mg by mouth every 6 (six) hours as needed for headache.   Yes Historical Provider, MD  albuterol (PROVENTIL HFA;VENTOLIN HFA) 108 (90 BASE) MCG/ACT inhaler Inhale 2 puffs into the lungs every  6 (six) hours as needed for wheezing or shortness of breath.   Yes Historical Provider, MD  amLODipine (NORVASC) 10 MG tablet Take 10 mg by mouth every morning.    Yes Historical Provider, MD  Cholecalciferol (VITAMIN D) 2000 UNITS CAPS Take 2,000 Units by mouth daily.   Yes Historical Provider, MD  cholestyramine light (PREVALITE) 4 G packet Take 4 g by mouth daily. Reported on 08/01/2015   Yes Historical Provider, MD  clopidogrel (PLAVIX) 75 MG tablet Take 75 mg by mouth at bedtime. Reported on 08/01/2015   Yes Historical Provider, MD  famotidine (PEPCID) 10 MG tablet Take 10 mg by mouth daily with lunch.    Yes Historical Provider, MD  hydrochlorothiazide (HYDRODIURIL) 25 MG tablet Take 25 mg by mouth every morning.   Yes Historical Provider, MD  HYDROcodone-acetaminophen (NORCO/VICODIN) 5-325 MG tablet Take 1-2 tablets by mouth every 6 (six) hours as needed for moderate pain or severe pain. 06/19/15  Yes Hannah Muthersbaugh, PA-C  latanoprost (XALATAN) 0.005 % ophthalmic solution Place 1 drop into both eyes at bedtime.  05/15/13  Yes Historical Provider, MD  lisinopril (PRINIVIL,ZESTRIL) 10 MG tablet Take 10 mg by mouth every morning.    Yes Historical Provider, MD  metFORMIN (GLUCOPHAGE-XR) 500 MG 24 hr tablet Take 500 mg by mouth every evening.   Yes Historical Provider, MD  Naphazoline-Pheniramine (OPCON-A OP) Apply 1 drop to eye 2 (two) times daily as needed (allergies.).   Yes Historical Provider, MD  Neomycin-Bacitracin-Polymyxin (HCA TRIPLE ANTIBIOTIC OINTMENT EX) Apply 1 application topically daily as needed (wounds).  04/08/15  Yes Historical Provider, MD  ondansetron (ZOFRAN ODT) 8 MG disintegrating tablet 8mg  ODT q4 hours prn nausea Patient taking differently: Take 8 mg by mouth every 4 (four) hours as needed for nausea or vomiting.  06/19/15  Yes Hannah Muthersbaugh, PA-C  Simethicone (GAS-X PO) Take 1 capsule by mouth daily as needed (abdominal pain).   Yes Historical Provider, MD   sitaGLIPtin (JANUVIA) 100 MG tablet Take 100 mg by mouth daily.   Yes Historical Provider, MD  sodium chloride (OCEAN) 0.65 % SOLN nasal spray Place 1-2 sprays into both nostrils daily as needed for congestion.   Yes Historical Provider, MD  traMADol (ULTRAM) 50 MG tablet Take 50 mg by mouth every 6 (six) hours as needed (Back pain.).    Yes Historical Provider, MD  azithromycin (ZITHROMAX) 250 MG tablet Take 1 tablet (250 mg total) by mouth daily. Take first 2 tablets together, then 1 every day until finished. Patient not taking: Reported on 08/01/2015 06/19/15   Jarrett Soho Muthersbaugh, PA-C  doxycycline (VIBRAMYCIN) 100 MG capsule Take 1 capsule (100 mg total) by mouth 2 (two) times daily. 08/01/15   Forde Dandy, MD  metroNIDAZOLE (FLAGYL) 500 MG tablet  Take 1 tablet (500 mg total) by mouth 3 (three) times daily. Patient not taking: Reported on 08/01/2015 06/19/15   Jarrett Soho Muthersbaugh, PA-C   BP 151/73 mmHg  Pulse 74  Temp(Src) 98 F (36.7 C) (Oral)  Resp 20  SpO2 99% Physical Exam Physical Exam  Nursing note and vitals reviewed. Constitutional: Well developed, well nourished, non-toxic, and in no acute distress Head: Normocephalic and atraumatic.  Mouth/Throat: Oropharynx is clear and moist.  Neck: Normal range of motion. Neck supple.  Cardiovascular: Normal rate and regular rhythm.   Pulmonary/Chest: Effort normal and breath sounds normal.  Abdominal: Soft. Obese. Minimal distension. Just lateral left of the umbilicus there is papable knot with overlying erythema and induration. There is no tenderness. There is no rebound and no guarding.  Musculoskeletal: Normal range of motion.  Neurological: Alert, no facial droop, fluent speech, moves all extremities symmetrically Skin: Skin is warm and dry.  Psychiatric: Cooperative  ED Course  Procedures (including critical care time) Labs Review Labs Reviewed  COMPREHENSIVE METABOLIC PANEL - Abnormal; Notable for the following:    Glucose,  Bld 123 (*)    Total Bilirubin 0.2 (*)    All other components within normal limits  CBC WITH DIFFERENTIAL/PLATELET  I-STAT CG4 LACTIC ACID, ED  I-STAT CG4 LACTIC ACID, ED    Imaging Review Ct Abdomen Pelvis W Contrast  08/01/2015  CLINICAL DATA:  Enlarging umbilical hernia EXAM: CT ABDOMEN AND PELVIS WITH CONTRAST TECHNIQUE: Multidetector CT imaging of the abdomen and pelvis was performed using the standard protocol following bolus administration of intravenous contrast. CONTRAST:  162mL OMNIPAQUE IOHEXOL 300 MG/ML  SOLN COMPARISON:  06/19/2015 FINDINGS: Lung bases are free of acute infiltrate or sizable effusion. The gallbladder has been surgically removed. The liver, spleen, adrenal glands and pancreas are normal in their CT appearance. The kidneys demonstrate normal enhancement bilaterally. Bilateral parapelvic cysts are seen and stable. No obstructive changes are noted. The appendix is not visualized consistent with prior surgical history. The uterus has been removed as well. No pelvic mass lesion is seen. Degenerative changes of the lumbar spine are noted. Multiple anterior abdominal wall surgeries are seen consistent with the given clinical history. Laxity of the anterior abdominal wall is again identified stable in appearance from the prior exam. Adjacent to the umbilicus is a small area of inflammatory change which is new from the prior exam. This likely represents some focal cellulitis. IMPRESSION: Continued laxity of the anterior abdominal wall. No true hernia is seen. Small area of inflammatory change adjacent to the umbilicus on the right is noted consistent with a small area of cellulitis. Postsurgical changes. Electronically Signed   By: Inez Catalina M.D.   On: 08/01/2015 14:24   US Thyroid Biopsy  07/31/2015  INDICATION: Right thyroid nodule measuring 3.1 x 1.0 x 1.1 cms which has grown from 2.5 cms in November, 2016. Request for fine needle aspirate biopsy. EXAM: ULTRASOUND GUIDED NEEDLE  ASPIRATE BIOPSY OF THE THYROID GLAND MEDICATIONS: 1% Lidocaine. ANESTHESIA/SEDATION: No sedation medication given. COMPARISON:  Ultrasound done 07/21/2015 and CT Chest done 0000000 COMPLICATIONS: None immediate. PROCEDURE: Thyroid biopsy was thoroughly discussed with the patient and questions were answered. The benefits, risks, alternatives, and complications were also discussed. The patient understands and wishes to proceed with the procedure. Written consent was obtained. Ultrasound was performed to localize and mark an adequate site for the biopsy. The patient was then prepped and draped in a normal sterile fashion. Local anesthesia was provided with 1% lidocaine. Using direct  ultrasound guidance, 4 passes were made using needles into the nodule within the right lobe of the thyroid. Ultrasound was used to confirm needle placements on all occasions. Specimens were sent to Pathology for analysis. IMPRESSION: Ultrasound guided needle aspirate biopsy performed of the right thyroid nodule. Read by:  Gareth Eagle, PA-C Electronically Signed   By: Lucrezia Europe M.D.   On: 07/31/2015 11:43   I have personally reviewed and evaluated these images and lab results as part of my medical decision-making.   EKG Interpretation None      MDM   Final diagnoses:  Abdominal wall cellulitis    70 year old female with history of multiple abdominal surgeries and diabetes who presents to emergency department with concern for incarcerated hernia. She is afebrile and hemodynamically stable on arrival. She is well-appearing in no acute distress. Her abdomen is overall soft and nonsurgical. There is a palpable knot just lateral to the umbilicus with overlying erythema, warmth, and induration. Area of erythema extends approximately over a 2 x 3 cm area. No palpable defect or bowel noted on exam, and unclear if this is truly hernia or other process. CT abd/pelvis performed showing no acute intra-abdominal processes. There is  evidence of cellulitis over the anterior abdominal wall but does not have underlying abscess or extension into the abdomen. No systemic signs or symptoms of infection, and is appropriate for outpatient management. Given a course of doxycycline. Will follow up with PCP next week for reevaluation. Strict return and follow-up instructions are reviewed. She expressed understanding of all discharge instructions and felt comfortable to plan of care.  Forde Dandy, MD 08/01/15 [phone]

## 2015-08-01 NOTE — ED Notes (Signed)
Pt has hx of umbilical hernia that is able to be reduced.  Pt has strained for bm in last week.  Noted abdominal tenderness and bulging. Went to md and unable to be reduced at office.  Mild discomfort.  Nausea noted with no emesis.  Redness to area.

## 2015-08-06 DIAGNOSIS — L03316 Cellulitis of umbilicus: Secondary | ICD-10-CM | POA: Diagnosis not present

## 2015-08-06 DIAGNOSIS — E119 Type 2 diabetes mellitus without complications: Secondary | ICD-10-CM | POA: Diagnosis not present

## 2015-08-06 DIAGNOSIS — Z7984 Long term (current) use of oral hypoglycemic drugs: Secondary | ICD-10-CM | POA: Diagnosis not present

## 2015-08-20 DIAGNOSIS — Q644 Malformation of urachus: Secondary | ICD-10-CM | POA: Diagnosis not present

## 2015-08-22 ENCOUNTER — Ambulatory Visit: Payer: Commercial Managed Care - HMO | Admitting: Neurology

## 2015-08-26 DIAGNOSIS — L02211 Cutaneous abscess of abdominal wall: Secondary | ICD-10-CM | POA: Diagnosis not present

## 2015-09-09 DIAGNOSIS — N63 Unspecified lump in breast: Secondary | ICD-10-CM | POA: Diagnosis not present

## 2015-09-10 DIAGNOSIS — M1711 Unilateral primary osteoarthritis, right knee: Secondary | ICD-10-CM | POA: Diagnosis not present

## 2015-10-02 DIAGNOSIS — E119 Type 2 diabetes mellitus without complications: Secondary | ICD-10-CM | POA: Diagnosis not present

## 2015-10-02 DIAGNOSIS — H401131 Primary open-angle glaucoma, bilateral, mild stage: Secondary | ICD-10-CM | POA: Diagnosis not present

## 2015-10-02 DIAGNOSIS — Z961 Presence of intraocular lens: Secondary | ICD-10-CM | POA: Diagnosis not present

## 2015-12-28 DIAGNOSIS — R300 Dysuria: Secondary | ICD-10-CM | POA: Diagnosis not present

## 2015-12-28 DIAGNOSIS — N390 Urinary tract infection, site not specified: Secondary | ICD-10-CM | POA: Diagnosis not present

## 2016-01-27 ENCOUNTER — Other Ambulatory Visit: Payer: Self-pay | Admitting: Geriatric Medicine

## 2016-01-27 ENCOUNTER — Ambulatory Visit
Admission: RE | Admit: 2016-01-27 | Discharge: 2016-01-27 | Disposition: A | Discharge status: Home / Self Care | Payer: Commercial Managed Care - HMO | Source: Ambulatory Visit | Attending: Geriatric Medicine | Admitting: Geriatric Medicine

## 2016-01-27 DIAGNOSIS — Z6832 Body mass index (BMI) 32.0-32.9, adult: Secondary | ICD-10-CM | POA: Diagnosis not present

## 2016-01-27 DIAGNOSIS — Z23 Encounter for immunization: Secondary | ICD-10-CM | POA: Diagnosis not present

## 2016-01-27 DIAGNOSIS — Z0000 Encounter for general adult medical examination without abnormal findings: Secondary | ICD-10-CM | POA: Diagnosis not present

## 2016-01-27 DIAGNOSIS — Z1389 Encounter for screening for other disorder: Secondary | ICD-10-CM | POA: Diagnosis not present

## 2016-01-27 DIAGNOSIS — E041 Nontoxic single thyroid nodule: Secondary | ICD-10-CM | POA: Diagnosis not present

## 2016-01-27 DIAGNOSIS — E119 Type 2 diabetes mellitus without complications: Secondary | ICD-10-CM | POA: Diagnosis not present

## 2016-01-27 DIAGNOSIS — E7800 Pure hypercholesterolemia, unspecified: Secondary | ICD-10-CM | POA: Diagnosis not present

## 2016-01-27 DIAGNOSIS — Z79899 Other long term (current) drug therapy: Secondary | ICD-10-CM | POA: Diagnosis not present

## 2016-01-27 DIAGNOSIS — I700 Atherosclerosis of aorta: Secondary | ICD-10-CM | POA: Diagnosis not present

## 2016-01-27 DIAGNOSIS — I10 Essential (primary) hypertension: Secondary | ICD-10-CM | POA: Diagnosis not present

## 2016-01-28 ENCOUNTER — Other Ambulatory Visit: Payer: Self-pay | Admitting: Geriatric Medicine

## 2016-03-05 DIAGNOSIS — H401131 Primary open-angle glaucoma, bilateral, mild stage: Secondary | ICD-10-CM | POA: Diagnosis not present

## 2016-03-05 DIAGNOSIS — H5213 Myopia, bilateral: Secondary | ICD-10-CM | POA: Diagnosis not present

## 2016-03-05 DIAGNOSIS — E119 Type 2 diabetes mellitus without complications: Secondary | ICD-10-CM | POA: Diagnosis not present

## 2016-03-11 DIAGNOSIS — R922 Inconclusive mammogram: Secondary | ICD-10-CM | POA: Diagnosis not present

## 2016-03-25 DIAGNOSIS — H401131 Primary open-angle glaucoma, bilateral, mild stage: Secondary | ICD-10-CM | POA: Diagnosis not present

## 2016-03-25 DIAGNOSIS — Z961 Presence of intraocular lens: Secondary | ICD-10-CM | POA: Diagnosis not present

## 2016-03-25 DIAGNOSIS — H00022 Hordeolum internum right lower eyelid: Secondary | ICD-10-CM | POA: Diagnosis not present

## 2016-04-05 DIAGNOSIS — H00022 Hordeolum internum right lower eyelid: Secondary | ICD-10-CM | POA: Diagnosis not present

## 2016-04-05 DIAGNOSIS — E119 Type 2 diabetes mellitus without complications: Secondary | ICD-10-CM | POA: Diagnosis not present

## 2016-04-05 DIAGNOSIS — H401131 Primary open-angle glaucoma, bilateral, mild stage: Secondary | ICD-10-CM | POA: Diagnosis not present

## 2016-04-14 DIAGNOSIS — N760 Acute vaginitis: Secondary | ICD-10-CM | POA: Diagnosis not present

## 2016-04-14 DIAGNOSIS — L304 Erythema intertrigo: Secondary | ICD-10-CM | POA: Diagnosis not present

## 2016-04-14 DIAGNOSIS — R102 Pelvic and perineal pain: Secondary | ICD-10-CM | POA: Diagnosis not present

## 2016-04-29 ENCOUNTER — Other Ambulatory Visit: Payer: Self-pay | Admitting: Nurse Practitioner

## 2016-04-29 DIAGNOSIS — R21 Rash and other nonspecific skin eruption: Secondary | ICD-10-CM | POA: Diagnosis not present

## 2016-04-29 DIAGNOSIS — N909 Noninflammatory disorder of vulva and perineum, unspecified: Secondary | ICD-10-CM | POA: Diagnosis not present

## 2016-04-29 DIAGNOSIS — B079 Viral wart, unspecified: Secondary | ICD-10-CM | POA: Diagnosis not present

## 2016-05-13 DIAGNOSIS — N9089 Other specified noninflammatory disorders of vulva and perineum: Secondary | ICD-10-CM | POA: Diagnosis not present

## 2016-05-13 DIAGNOSIS — B977 Papillomavirus as the cause of diseases classified elsewhere: Secondary | ICD-10-CM | POA: Diagnosis not present

## 2016-06-01 DIAGNOSIS — A630 Anogenital (venereal) warts: Secondary | ICD-10-CM | POA: Diagnosis not present

## 2016-07-06 DIAGNOSIS — H401131 Primary open-angle glaucoma, bilateral, mild stage: Secondary | ICD-10-CM | POA: Diagnosis not present

## 2016-07-06 DIAGNOSIS — H02834 Dermatochalasis of left upper eyelid: Secondary | ICD-10-CM | POA: Diagnosis not present

## 2016-07-06 DIAGNOSIS — Z961 Presence of intraocular lens: Secondary | ICD-10-CM | POA: Diagnosis not present

## 2016-07-06 DIAGNOSIS — E119 Type 2 diabetes mellitus without complications: Secondary | ICD-10-CM | POA: Diagnosis not present

## 2016-07-06 DIAGNOSIS — H02831 Dermatochalasis of right upper eyelid: Secondary | ICD-10-CM | POA: Diagnosis not present

## 2016-07-13 DIAGNOSIS — R51 Headache: Secondary | ICD-10-CM | POA: Diagnosis not present

## 2016-07-30 DIAGNOSIS — Z7984 Long term (current) use of oral hypoglycemic drugs: Secondary | ICD-10-CM | POA: Diagnosis not present

## 2016-07-30 DIAGNOSIS — Z6832 Body mass index (BMI) 32.0-32.9, adult: Secondary | ICD-10-CM | POA: Diagnosis not present

## 2016-07-30 DIAGNOSIS — E119 Type 2 diabetes mellitus without complications: Secondary | ICD-10-CM | POA: Diagnosis not present

## 2016-07-30 DIAGNOSIS — I700 Atherosclerosis of aorta: Secondary | ICD-10-CM | POA: Diagnosis not present

## 2016-07-30 DIAGNOSIS — I10 Essential (primary) hypertension: Secondary | ICD-10-CM | POA: Diagnosis not present

## 2016-08-31 DIAGNOSIS — K006 Disturbances in tooth eruption: Secondary | ICD-10-CM | POA: Diagnosis not present

## 2016-09-13 DIAGNOSIS — J309 Allergic rhinitis, unspecified: Secondary | ICD-10-CM | POA: Diagnosis not present

## 2016-09-13 DIAGNOSIS — R002 Palpitations: Secondary | ICD-10-CM | POA: Diagnosis not present

## 2016-09-13 DIAGNOSIS — I10 Essential (primary) hypertension: Secondary | ICD-10-CM | POA: Diagnosis not present

## 2016-09-21 ENCOUNTER — Ambulatory Visit (INDEPENDENT_AMBULATORY_CARE_PROVIDER_SITE_OTHER): Payer: Medicare HMO

## 2016-09-21 DIAGNOSIS — R002 Palpitations: Secondary | ICD-10-CM

## 2016-10-07 DIAGNOSIS — N6313 Unspecified lump in the right breast, lower outer quadrant: Secondary | ICD-10-CM | POA: Diagnosis not present

## 2016-10-21 DIAGNOSIS — A630 Anogenital (venereal) warts: Secondary | ICD-10-CM | POA: Diagnosis not present

## 2016-10-29 DIAGNOSIS — I10 Essential (primary) hypertension: Secondary | ICD-10-CM | POA: Diagnosis not present

## 2016-10-29 DIAGNOSIS — E669 Obesity, unspecified: Secondary | ICD-10-CM | POA: Diagnosis not present

## 2016-10-29 DIAGNOSIS — E119 Type 2 diabetes mellitus without complications: Secondary | ICD-10-CM | POA: Diagnosis not present

## 2016-10-29 DIAGNOSIS — Z79899 Other long term (current) drug therapy: Secondary | ICD-10-CM | POA: Diagnosis not present

## 2016-10-29 DIAGNOSIS — I700 Atherosclerosis of aorta: Secondary | ICD-10-CM | POA: Diagnosis not present

## 2016-10-29 DIAGNOSIS — Z6831 Body mass index (BMI) 31.0-31.9, adult: Secondary | ICD-10-CM | POA: Diagnosis not present

## 2016-10-29 DIAGNOSIS — E7800 Pure hypercholesterolemia, unspecified: Secondary | ICD-10-CM | POA: Diagnosis not present

## 2016-10-29 DIAGNOSIS — Z6841 Body Mass Index (BMI) 40.0 and over, adult: Secondary | ICD-10-CM | POA: Diagnosis not present

## 2016-10-29 DIAGNOSIS — Z7984 Long term (current) use of oral hypoglycemic drugs: Secondary | ICD-10-CM | POA: Diagnosis not present

## 2016-11-08 DIAGNOSIS — M76891 Other specified enthesopathies of right lower limb, excluding foot: Secondary | ICD-10-CM | POA: Diagnosis not present

## 2016-11-08 DIAGNOSIS — M1711 Unilateral primary osteoarthritis, right knee: Secondary | ICD-10-CM | POA: Diagnosis not present

## 2016-11-18 DIAGNOSIS — L219 Seborrheic dermatitis, unspecified: Secondary | ICD-10-CM | POA: Diagnosis not present

## 2016-11-30 DIAGNOSIS — L820 Inflamed seborrheic keratosis: Secondary | ICD-10-CM | POA: Diagnosis not present

## 2016-11-30 DIAGNOSIS — D3610 Benign neoplasm of peripheral nerves and autonomic nervous system, unspecified: Secondary | ICD-10-CM | POA: Diagnosis not present

## 2017-01-06 DIAGNOSIS — M76891 Other specified enthesopathies of right lower limb, excluding foot: Secondary | ICD-10-CM | POA: Diagnosis not present

## 2017-01-06 DIAGNOSIS — M1711 Unilateral primary osteoarthritis, right knee: Secondary | ICD-10-CM | POA: Diagnosis not present

## 2017-01-11 DIAGNOSIS — R300 Dysuria: Secondary | ICD-10-CM | POA: Diagnosis not present

## 2017-01-19 ENCOUNTER — Other Ambulatory Visit: Payer: Self-pay | Admitting: Nurse Practitioner

## 2017-01-19 ENCOUNTER — Ambulatory Visit
Admission: RE | Admit: 2017-01-19 | Discharge: 2017-01-19 | Disposition: A | Discharge status: Home / Self Care | Payer: Medicare HMO | Source: Ambulatory Visit | Attending: Nurse Practitioner | Admitting: Nurse Practitioner

## 2017-01-19 DIAGNOSIS — R1031 Right lower quadrant pain: Secondary | ICD-10-CM

## 2017-01-19 DIAGNOSIS — R300 Dysuria: Secondary | ICD-10-CM | POA: Diagnosis not present

## 2017-01-19 DIAGNOSIS — K529 Noninfective gastroenteritis and colitis, unspecified: Secondary | ICD-10-CM | POA: Diagnosis not present

## 2017-01-19 MED ORDER — IOPAMIDOL (ISOVUE-300) INJECTION 61%
75.0000 mL | Freq: Once | INTRAVENOUS | Status: AC | PRN
  Administered 2017-01-19: 75 mL via INTRAVENOUS

## 2017-01-27 DIAGNOSIS — E119 Type 2 diabetes mellitus without complications: Secondary | ICD-10-CM | POA: Diagnosis not present

## 2017-01-27 DIAGNOSIS — E7800 Pure hypercholesterolemia, unspecified: Secondary | ICD-10-CM | POA: Diagnosis not present

## 2017-01-27 DIAGNOSIS — Z1389 Encounter for screening for other disorder: Secondary | ICD-10-CM | POA: Diagnosis not present

## 2017-01-27 DIAGNOSIS — Z0000 Encounter for general adult medical examination without abnormal findings: Secondary | ICD-10-CM | POA: Diagnosis not present

## 2017-01-29 IMAGING — CT CT ABD-PELV W/ CM
2 of 5 series · 17 of 46 positions shown, 19 images · IV contrast (OMNIPAQUE 300)
Comparison: 06/19/2015

CLINICAL DATA: Enlarging umbilical hernia

EXAM:
CT ABDOMEN AND PELVIS WITH CONTRAST
TECHNIQUE: Multidetector CT imaging of the abdomen and pelvis was performed
using the standard protocol following bolus administration of
intravenous contrast.
CONTRAST:  100mL OMNIPAQUE IOHEXOL 300 MG/ML  SOLN

[Series 2: abd/pel with · axial · 0.74mm/px · z∈[-444,-59]mm · 14 of 87 slices shown, 16 images]
[im 5/87  soft-tissue]
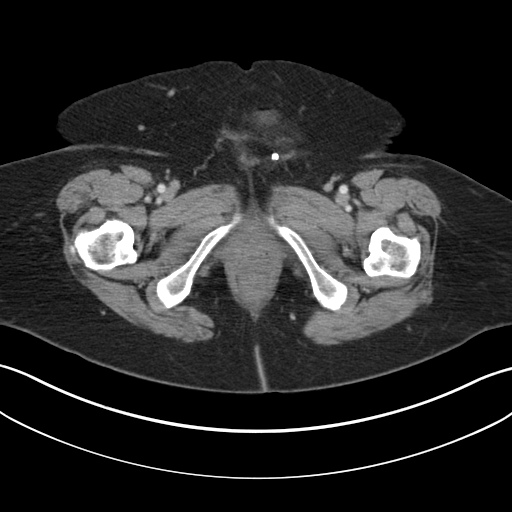
[im 5/87  bone]
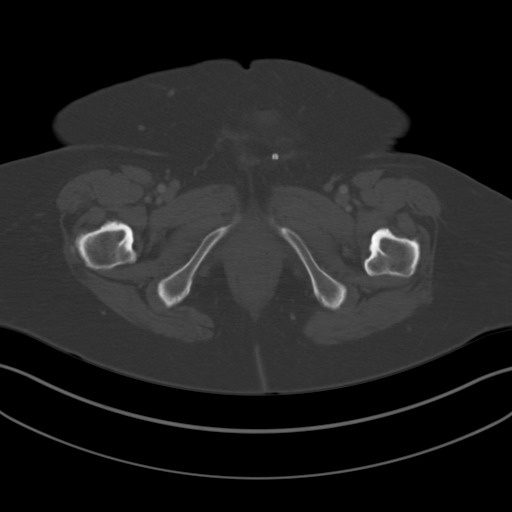
[im 10/87  soft-tissue]
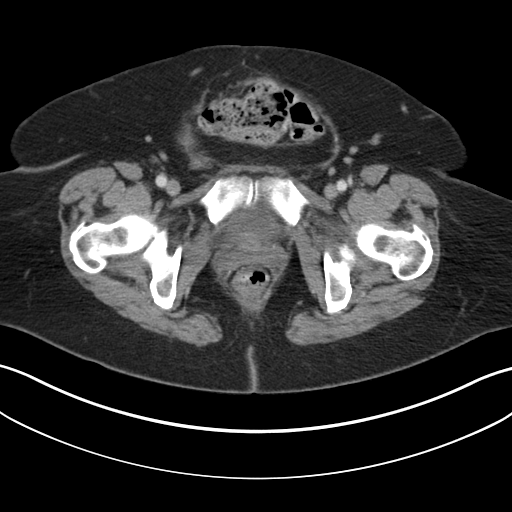
[im 20/87  soft-tissue]
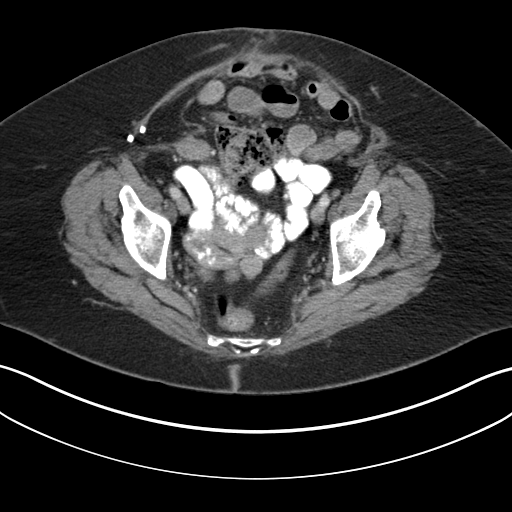
[im 24/87  soft-tissue]
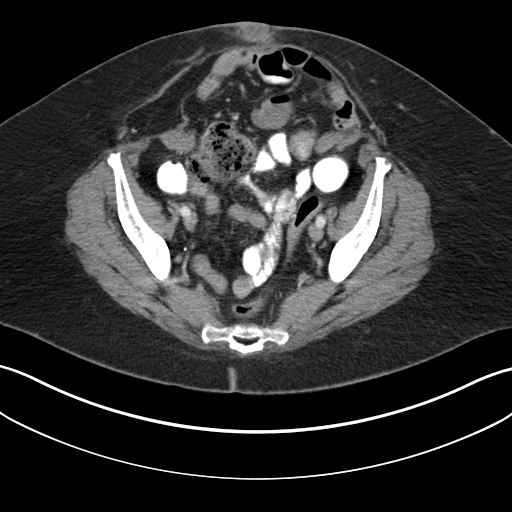
[im 29/87  soft-tissue]
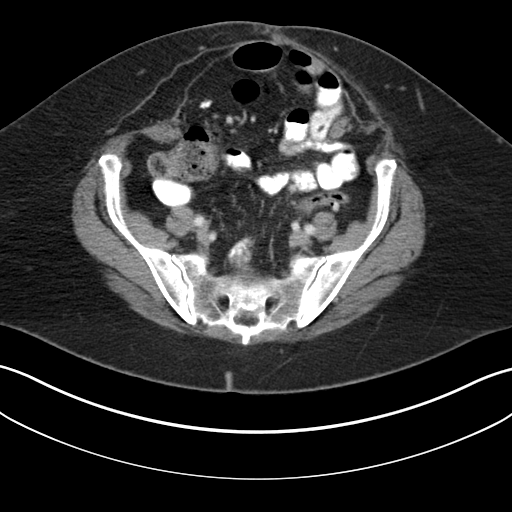
[im 34/87  soft-tissue]
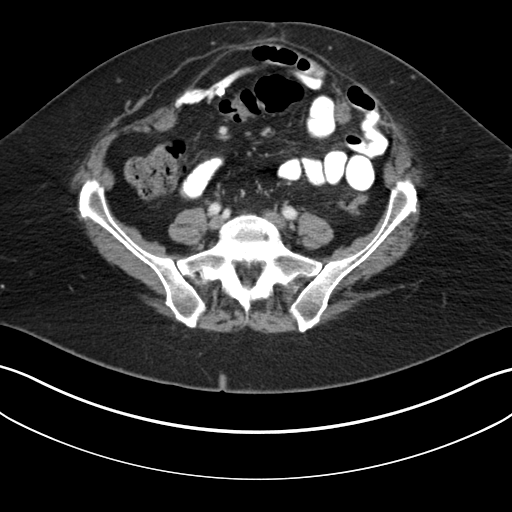
[im 39/87  soft-tissue]
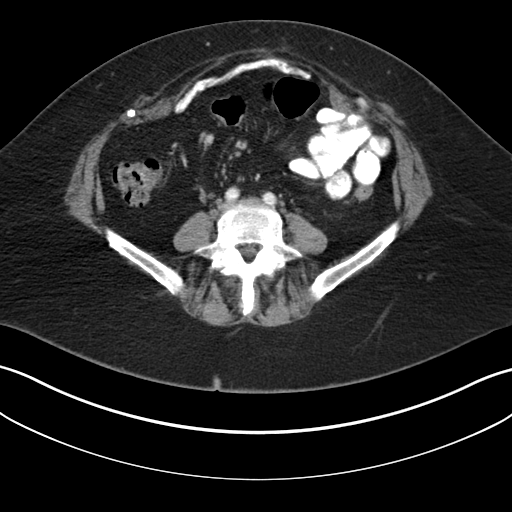
[im 48/87  soft-tissue]
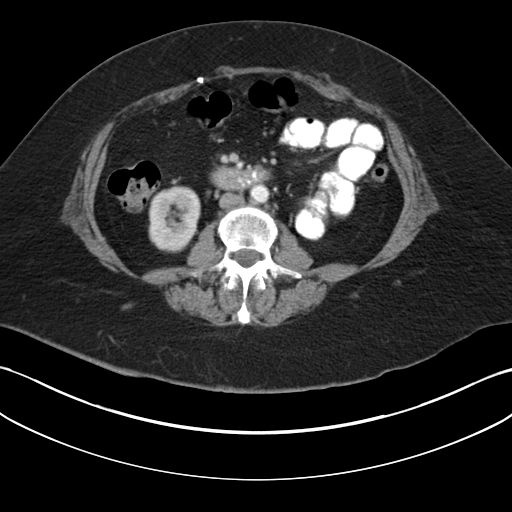
[im 53/87  soft-tissue]
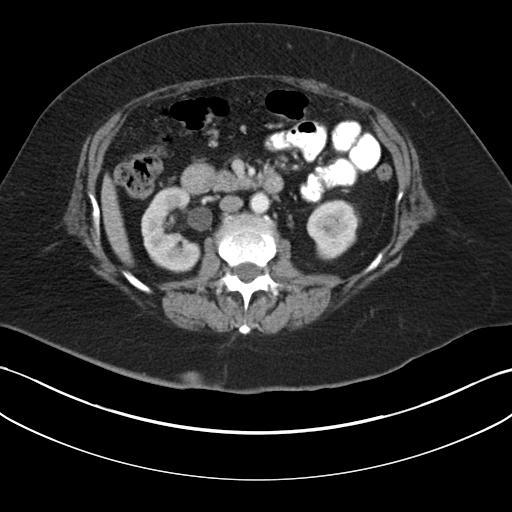
[im 53/87  bone]
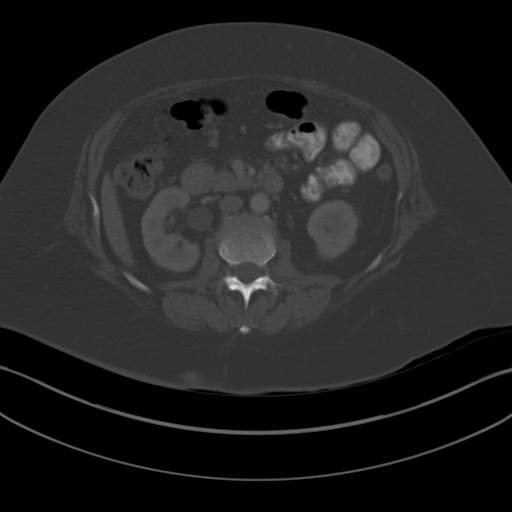
[im 58/87  soft-tissue]
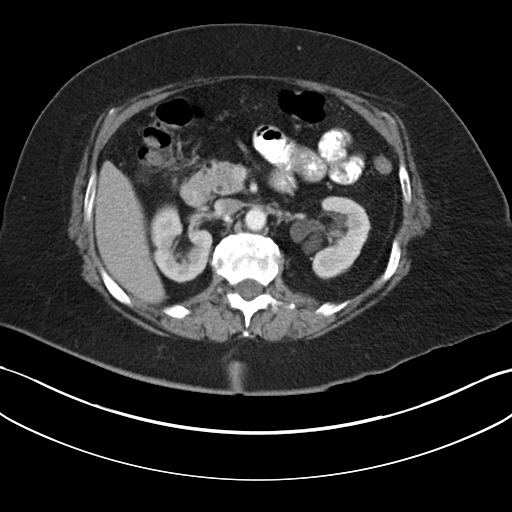
[im 63/87  soft-tissue]
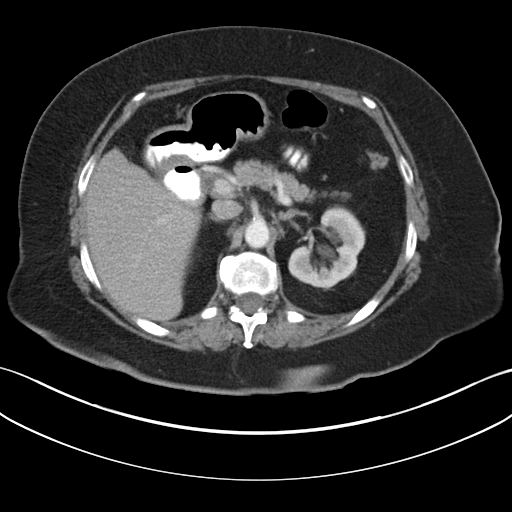
[im 67/87  soft-tissue]
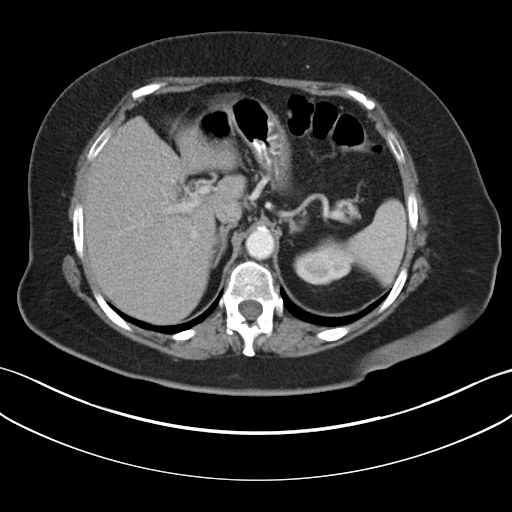
[im 77/87  soft-tissue]
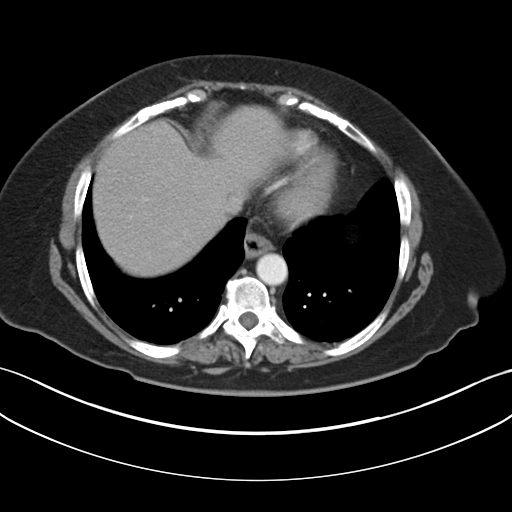
[im 82/87  soft-tissue]
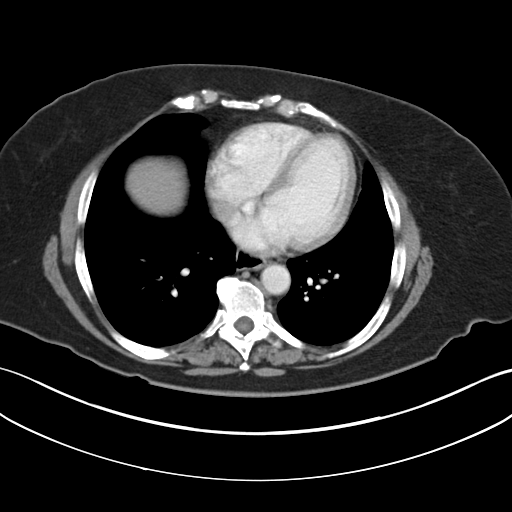

[Series 5: coronal a/|p · coronal · 0.86mm/px · 3 of 111 slices shown]
[im 37/111  soft-tissue]
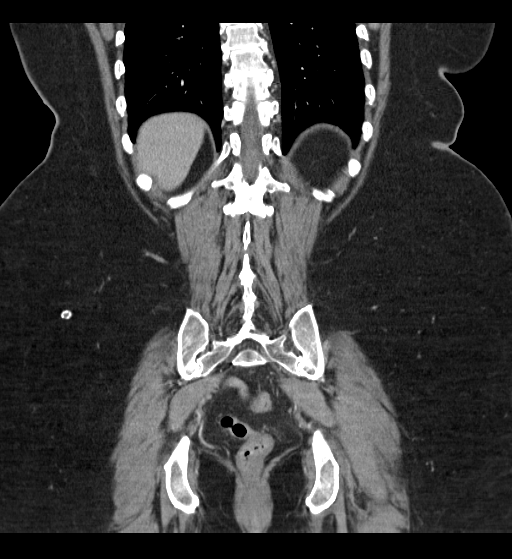
[im 49/111  soft-tissue]
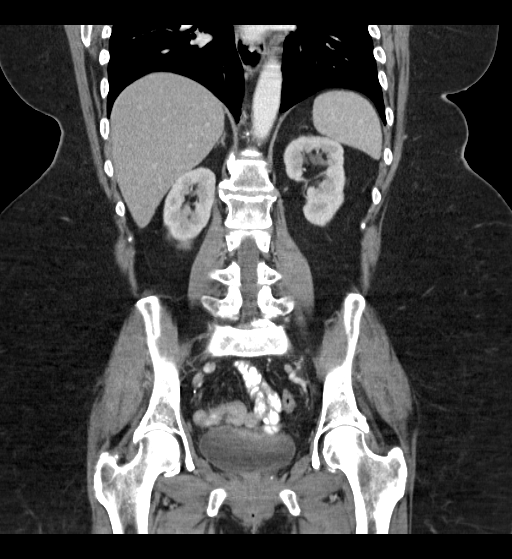
[im 62/111  soft-tissue]
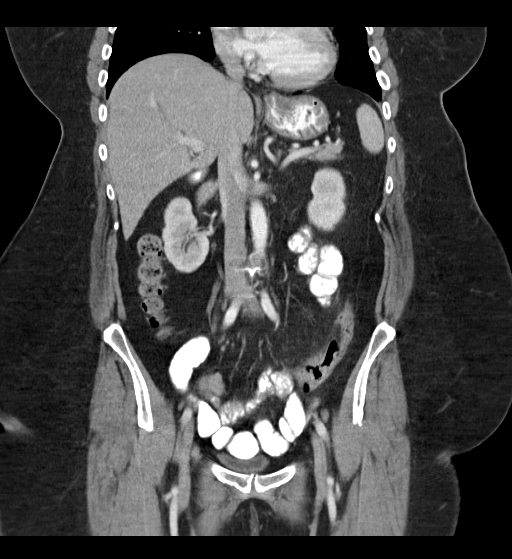

[17 of 46 positions shown; findings below may reference images not displayed]

FINDINGS: Lung bases are free of acute infiltrate or sizable effusion.

The gallbladder has been surgically removed. The liver, spleen,
adrenal glands and pancreas are normal in their CT appearance. The
kidneys demonstrate normal enhancement bilaterally. Bilateral
parapelvic cysts are seen and stable. No obstructive changes are
noted.

The appendix is not visualized consistent with prior surgical
history. The uterus has been removed as well. No pelvic mass lesion
is seen. Degenerative changes of the lumbar spine are noted.

Multiple anterior abdominal wall surgeries are seen consistent with
the given clinical history. Laxity of the anterior abdominal wall is
again identified stable in appearance from the prior exam. Adjacent
to the umbilicus is a small area of inflammatory change which is new
from the prior exam. This likely represents some focal cellulitis.
IMPRESSION: Continued laxity of the anterior abdominal wall. No true hernia is
seen.

Small area of inflammatory change adjacent to the umbilicus on the
right is noted consistent with a small area of cellulitis.

Postsurgical changes.

## 2017-02-23 DIAGNOSIS — I10 Essential (primary) hypertension: Secondary | ICD-10-CM | POA: Diagnosis not present

## 2017-02-23 DIAGNOSIS — E119 Type 2 diabetes mellitus without complications: Secondary | ICD-10-CM | POA: Diagnosis not present

## 2017-02-23 DIAGNOSIS — E1140 Type 2 diabetes mellitus with diabetic neuropathy, unspecified: Secondary | ICD-10-CM | POA: Diagnosis not present

## 2017-03-01 DIAGNOSIS — H401131 Primary open-angle glaucoma, bilateral, mild stage: Secondary | ICD-10-CM | POA: Diagnosis not present

## 2017-03-01 DIAGNOSIS — E119 Type 2 diabetes mellitus without complications: Secondary | ICD-10-CM | POA: Diagnosis not present

## 2017-03-01 DIAGNOSIS — H02831 Dermatochalasis of right upper eyelid: Secondary | ICD-10-CM | POA: Diagnosis not present

## 2017-03-01 DIAGNOSIS — H02834 Dermatochalasis of left upper eyelid: Secondary | ICD-10-CM | POA: Diagnosis not present

## 2017-03-04 DIAGNOSIS — K219 Gastro-esophageal reflux disease without esophagitis: Secondary | ICD-10-CM | POA: Diagnosis not present

## 2017-03-23 DIAGNOSIS — I10 Essential (primary) hypertension: Secondary | ICD-10-CM | POA: Diagnosis not present

## 2017-03-23 DIAGNOSIS — E1140 Type 2 diabetes mellitus with diabetic neuropathy, unspecified: Secondary | ICD-10-CM | POA: Diagnosis not present

## 2017-03-23 DIAGNOSIS — E119 Type 2 diabetes mellitus without complications: Secondary | ICD-10-CM | POA: Diagnosis not present

## 2017-03-28 ENCOUNTER — Other Ambulatory Visit: Payer: Self-pay | Admitting: Orthopedic Surgery

## 2017-03-28 DIAGNOSIS — G8929 Other chronic pain: Secondary | ICD-10-CM

## 2017-03-28 DIAGNOSIS — M25561 Pain in right knee: Principal | ICD-10-CM

## 2017-04-01 ENCOUNTER — Ambulatory Visit
Admission: RE | Admit: 2017-04-01 | Discharge: 2017-04-01 | Disposition: A | Discharge status: Home / Self Care | Payer: Medicare HMO | Source: Ambulatory Visit | Attending: Orthopedic Surgery | Admitting: Orthopedic Surgery

## 2017-04-01 DIAGNOSIS — M25561 Pain in right knee: Principal | ICD-10-CM

## 2017-04-01 DIAGNOSIS — G8929 Other chronic pain: Secondary | ICD-10-CM

## 2017-04-05 DIAGNOSIS — M1711 Unilateral primary osteoarthritis, right knee: Secondary | ICD-10-CM | POA: Diagnosis not present

## 2017-04-05 DIAGNOSIS — M23331 Other meniscus derangements, other medial meniscus, right knee: Secondary | ICD-10-CM | POA: Diagnosis not present

## 2017-04-12 DIAGNOSIS — J069 Acute upper respiratory infection, unspecified: Secondary | ICD-10-CM | POA: Diagnosis not present

## 2017-04-12 DIAGNOSIS — I10 Essential (primary) hypertension: Secondary | ICD-10-CM | POA: Diagnosis not present

## 2017-04-12 DIAGNOSIS — B9789 Other viral agents as the cause of diseases classified elsewhere: Secondary | ICD-10-CM | POA: Diagnosis not present

## 2017-04-20 DIAGNOSIS — M23361 Other meniscus derangements, other lateral meniscus, right knee: Secondary | ICD-10-CM | POA: Diagnosis not present

## 2017-04-20 DIAGNOSIS — M659 Synovitis and tenosynovitis, unspecified: Secondary | ICD-10-CM | POA: Diagnosis not present

## 2017-04-20 DIAGNOSIS — S83271A Complex tear of lateral meniscus, current injury, right knee, initial encounter: Secondary | ICD-10-CM | POA: Diagnosis not present

## 2017-04-20 DIAGNOSIS — M2241 Chondromalacia patellae, right knee: Secondary | ICD-10-CM | POA: Diagnosis not present

## 2017-04-20 DIAGNOSIS — G8918 Other acute postprocedural pain: Secondary | ICD-10-CM | POA: Diagnosis not present

## 2017-04-20 DIAGNOSIS — M94261 Chondromalacia, right knee: Secondary | ICD-10-CM | POA: Diagnosis not present

## 2017-04-20 DIAGNOSIS — M67461 Ganglion, right knee: Secondary | ICD-10-CM | POA: Diagnosis not present

## 2017-04-27 DIAGNOSIS — I10 Essential (primary) hypertension: Secondary | ICD-10-CM | POA: Diagnosis not present

## 2017-04-27 DIAGNOSIS — E1140 Type 2 diabetes mellitus with diabetic neuropathy, unspecified: Secondary | ICD-10-CM | POA: Diagnosis not present

## 2017-04-27 DIAGNOSIS — E119 Type 2 diabetes mellitus without complications: Secondary | ICD-10-CM | POA: Diagnosis not present

## 2017-05-02 DIAGNOSIS — I10 Essential (primary) hypertension: Secondary | ICD-10-CM | POA: Diagnosis not present

## 2017-05-02 DIAGNOSIS — Z79899 Other long term (current) drug therapy: Secondary | ICD-10-CM | POA: Diagnosis not present

## 2017-05-02 DIAGNOSIS — Z7984 Long term (current) use of oral hypoglycemic drugs: Secondary | ICD-10-CM | POA: Diagnosis not present

## 2017-05-02 DIAGNOSIS — E7800 Pure hypercholesterolemia, unspecified: Secondary | ICD-10-CM | POA: Diagnosis not present

## 2017-05-02 DIAGNOSIS — Z6832 Body mass index (BMI) 32.0-32.9, adult: Secondary | ICD-10-CM | POA: Diagnosis not present

## 2017-05-02 DIAGNOSIS — Z23 Encounter for immunization: Secondary | ICD-10-CM | POA: Diagnosis not present

## 2017-05-02 DIAGNOSIS — E119 Type 2 diabetes mellitus without complications: Secondary | ICD-10-CM | POA: Diagnosis not present

## 2017-05-02 DIAGNOSIS — E669 Obesity, unspecified: Secondary | ICD-10-CM | POA: Diagnosis not present

## 2017-05-05 DIAGNOSIS — M67461 Ganglion, right knee: Secondary | ICD-10-CM | POA: Diagnosis not present

## 2017-05-05 DIAGNOSIS — M23361 Other meniscus derangements, other lateral meniscus, right knee: Secondary | ICD-10-CM | POA: Diagnosis not present

## 2017-05-17 DIAGNOSIS — G8929 Other chronic pain: Secondary | ICD-10-CM | POA: Diagnosis not present

## 2017-05-17 DIAGNOSIS — M25561 Pain in right knee: Secondary | ICD-10-CM | POA: Diagnosis not present

## 2017-05-23 DIAGNOSIS — M25561 Pain in right knee: Secondary | ICD-10-CM | POA: Diagnosis not present

## 2017-05-23 DIAGNOSIS — G8929 Other chronic pain: Secondary | ICD-10-CM | POA: Diagnosis not present

## 2017-05-24 DIAGNOSIS — E1140 Type 2 diabetes mellitus with diabetic neuropathy, unspecified: Secondary | ICD-10-CM | POA: Diagnosis not present

## 2017-05-24 DIAGNOSIS — I10 Essential (primary) hypertension: Secondary | ICD-10-CM | POA: Diagnosis not present

## 2017-05-24 DIAGNOSIS — E119 Type 2 diabetes mellitus without complications: Secondary | ICD-10-CM | POA: Diagnosis not present

## 2017-05-25 DIAGNOSIS — G8929 Other chronic pain: Secondary | ICD-10-CM | POA: Diagnosis not present

## 2017-05-25 DIAGNOSIS — M25561 Pain in right knee: Secondary | ICD-10-CM | POA: Diagnosis not present

## 2017-05-30 DIAGNOSIS — G8929 Other chronic pain: Secondary | ICD-10-CM | POA: Diagnosis not present

## 2017-05-30 DIAGNOSIS — M25561 Pain in right knee: Secondary | ICD-10-CM | POA: Diagnosis not present

## 2017-06-02 DIAGNOSIS — G8929 Other chronic pain: Secondary | ICD-10-CM | POA: Diagnosis not present

## 2017-06-02 DIAGNOSIS — M1711 Unilateral primary osteoarthritis, right knee: Secondary | ICD-10-CM | POA: Diagnosis not present

## 2017-06-02 DIAGNOSIS — M25561 Pain in right knee: Secondary | ICD-10-CM | POA: Diagnosis not present

## 2017-06-07 DIAGNOSIS — Z7984 Long term (current) use of oral hypoglycemic drugs: Secondary | ICD-10-CM | POA: Diagnosis not present

## 2017-06-07 DIAGNOSIS — E1140 Type 2 diabetes mellitus with diabetic neuropathy, unspecified: Secondary | ICD-10-CM | POA: Diagnosis not present

## 2017-06-07 DIAGNOSIS — I10 Essential (primary) hypertension: Secondary | ICD-10-CM | POA: Diagnosis not present

## 2017-06-07 DIAGNOSIS — E119 Type 2 diabetes mellitus without complications: Secondary | ICD-10-CM | POA: Diagnosis not present

## 2017-06-13 DIAGNOSIS — M25561 Pain in right knee: Secondary | ICD-10-CM | POA: Diagnosis not present

## 2017-06-13 DIAGNOSIS — G8929 Other chronic pain: Secondary | ICD-10-CM | POA: Diagnosis not present

## 2017-06-16 DIAGNOSIS — G8929 Other chronic pain: Secondary | ICD-10-CM | POA: Diagnosis not present

## 2017-06-16 DIAGNOSIS — M25561 Pain in right knee: Secondary | ICD-10-CM | POA: Diagnosis not present

## 2017-06-30 DIAGNOSIS — R928 Other abnormal and inconclusive findings on diagnostic imaging of breast: Secondary | ICD-10-CM | POA: Diagnosis not present

## 2017-07-04 DIAGNOSIS — E119 Type 2 diabetes mellitus without complications: Secondary | ICD-10-CM | POA: Diagnosis not present

## 2017-07-04 DIAGNOSIS — H26493 Other secondary cataract, bilateral: Secondary | ICD-10-CM | POA: Diagnosis not present

## 2017-07-04 DIAGNOSIS — Z961 Presence of intraocular lens: Secondary | ICD-10-CM | POA: Diagnosis not present

## 2017-07-04 DIAGNOSIS — H401131 Primary open-angle glaucoma, bilateral, mild stage: Secondary | ICD-10-CM | POA: Diagnosis not present

## 2017-07-12 DIAGNOSIS — E7800 Pure hypercholesterolemia, unspecified: Secondary | ICD-10-CM | POA: Diagnosis not present

## 2017-07-12 DIAGNOSIS — Z79899 Other long term (current) drug therapy: Secondary | ICD-10-CM | POA: Diagnosis not present

## 2017-07-14 DIAGNOSIS — M2241 Chondromalacia patellae, right knee: Secondary | ICD-10-CM | POA: Diagnosis not present

## 2017-07-14 DIAGNOSIS — M67461 Ganglion, right knee: Secondary | ICD-10-CM | POA: Diagnosis not present

## 2017-07-14 DIAGNOSIS — M23361 Other meniscus derangements, other lateral meniscus, right knee: Secondary | ICD-10-CM | POA: Diagnosis not present

## 2017-07-14 DIAGNOSIS — Z4789 Encounter for other orthopedic aftercare: Secondary | ICD-10-CM | POA: Diagnosis not present

## 2017-07-20 DIAGNOSIS — E119 Type 2 diabetes mellitus without complications: Secondary | ICD-10-CM | POA: Diagnosis not present

## 2017-07-20 DIAGNOSIS — Z7984 Long term (current) use of oral hypoglycemic drugs: Secondary | ICD-10-CM | POA: Diagnosis not present

## 2017-07-20 DIAGNOSIS — E1140 Type 2 diabetes mellitus with diabetic neuropathy, unspecified: Secondary | ICD-10-CM | POA: Diagnosis not present

## 2017-07-20 DIAGNOSIS — I10 Essential (primary) hypertension: Secondary | ICD-10-CM | POA: Diagnosis not present

## 2017-08-31 DIAGNOSIS — R05 Cough: Secondary | ICD-10-CM | POA: Diagnosis not present

## 2017-08-31 DIAGNOSIS — J209 Acute bronchitis, unspecified: Secondary | ICD-10-CM | POA: Diagnosis not present

## 2017-08-31 DIAGNOSIS — R0989 Other specified symptoms and signs involving the circulatory and respiratory systems: Secondary | ICD-10-CM | POA: Diagnosis not present

## 2017-09-16 DIAGNOSIS — I10 Essential (primary) hypertension: Secondary | ICD-10-CM | POA: Diagnosis not present

## 2017-09-16 DIAGNOSIS — E119 Type 2 diabetes mellitus without complications: Secondary | ICD-10-CM | POA: Diagnosis not present

## 2017-09-16 DIAGNOSIS — E1140 Type 2 diabetes mellitus with diabetic neuropathy, unspecified: Secondary | ICD-10-CM | POA: Diagnosis not present

## 2017-09-16 DIAGNOSIS — Z7984 Long term (current) use of oral hypoglycemic drugs: Secondary | ICD-10-CM | POA: Diagnosis not present

## 2017-09-22 DIAGNOSIS — M1712 Unilateral primary osteoarthritis, left knee: Secondary | ICD-10-CM | POA: Diagnosis not present

## 2017-09-22 DIAGNOSIS — M25561 Pain in right knee: Secondary | ICD-10-CM | POA: Diagnosis not present

## 2017-09-22 DIAGNOSIS — M1711 Unilateral primary osteoarthritis, right knee: Secondary | ICD-10-CM | POA: Diagnosis not present

## 2017-09-22 DIAGNOSIS — M170 Bilateral primary osteoarthritis of knee: Secondary | ICD-10-CM | POA: Diagnosis not present

## 2017-09-27 DIAGNOSIS — Z6831 Body mass index (BMI) 31.0-31.9, adult: Secondary | ICD-10-CM | POA: Diagnosis not present

## 2017-09-27 DIAGNOSIS — R000 Tachycardia, unspecified: Secondary | ICD-10-CM | POA: Diagnosis not present

## 2017-09-27 DIAGNOSIS — E119 Type 2 diabetes mellitus without complications: Secondary | ICD-10-CM | POA: Diagnosis not present

## 2017-09-27 DIAGNOSIS — I10 Essential (primary) hypertension: Secondary | ICD-10-CM | POA: Diagnosis not present

## 2017-09-27 DIAGNOSIS — Z7984 Long term (current) use of oral hypoglycemic drugs: Secondary | ICD-10-CM | POA: Diagnosis not present

## 2017-09-27 DIAGNOSIS — Z79899 Other long term (current) drug therapy: Secondary | ICD-10-CM | POA: Diagnosis not present

## 2017-09-27 DIAGNOSIS — E669 Obesity, unspecified: Secondary | ICD-10-CM | POA: Diagnosis not present

## 2017-09-29 DIAGNOSIS — Z79899 Other long term (current) drug therapy: Secondary | ICD-10-CM | POA: Diagnosis not present

## 2017-09-29 DIAGNOSIS — M25562 Pain in left knee: Secondary | ICD-10-CM | POA: Diagnosis not present

## 2017-09-29 DIAGNOSIS — M1712 Unilateral primary osteoarthritis, left knee: Secondary | ICD-10-CM | POA: Diagnosis not present

## 2017-09-29 DIAGNOSIS — M25561 Pain in right knee: Secondary | ICD-10-CM | POA: Diagnosis not present

## 2017-09-29 DIAGNOSIS — M1711 Unilateral primary osteoarthritis, right knee: Secondary | ICD-10-CM | POA: Diagnosis not present

## 2017-10-18 DIAGNOSIS — R000 Tachycardia, unspecified: Secondary | ICD-10-CM | POA: Diagnosis not present

## 2017-10-23 ENCOUNTER — Emergency Department (HOSPITAL_COMMUNITY)
Admission: EM | Admit: 2017-10-23 | Discharge: 2017-10-23 | Disposition: A | Discharge status: Home / Self Care | Payer: Medicare HMO | Attending: Emergency Medicine | Admitting: Emergency Medicine

## 2017-10-23 ENCOUNTER — Emergency Department (HOSPITAL_COMMUNITY): Payer: Medicare HMO

## 2017-10-23 ENCOUNTER — Encounter (HOSPITAL_COMMUNITY): Payer: Self-pay | Admitting: Emergency Medicine

## 2017-10-23 DIAGNOSIS — N3000 Acute cystitis without hematuria: Secondary | ICD-10-CM | POA: Insufficient documentation

## 2017-10-23 DIAGNOSIS — Z87891 Personal history of nicotine dependence: Secondary | ICD-10-CM | POA: Insufficient documentation

## 2017-10-23 DIAGNOSIS — R42 Dizziness and giddiness: Secondary | ICD-10-CM | POA: Diagnosis not present

## 2017-10-23 DIAGNOSIS — Z7984 Long term (current) use of oral hypoglycemic drugs: Secondary | ICD-10-CM | POA: Diagnosis not present

## 2017-10-23 DIAGNOSIS — Z8673 Personal history of transient ischemic attack (TIA), and cerebral infarction without residual deficits: Secondary | ICD-10-CM | POA: Diagnosis not present

## 2017-10-23 DIAGNOSIS — Z79899 Other long term (current) drug therapy: Secondary | ICD-10-CM | POA: Insufficient documentation

## 2017-10-23 DIAGNOSIS — R55 Syncope and collapse: Secondary | ICD-10-CM | POA: Insufficient documentation

## 2017-10-23 DIAGNOSIS — E119 Type 2 diabetes mellitus without complications: Secondary | ICD-10-CM | POA: Diagnosis not present

## 2017-10-23 DIAGNOSIS — R0602 Shortness of breath: Secondary | ICD-10-CM | POA: Diagnosis not present

## 2017-10-23 DIAGNOSIS — Z7902 Long term (current) use of antithrombotics/antiplatelets: Secondary | ICD-10-CM | POA: Diagnosis not present

## 2017-10-23 DIAGNOSIS — I10 Essential (primary) hypertension: Secondary | ICD-10-CM | POA: Diagnosis not present

## 2017-10-23 DIAGNOSIS — I2510 Atherosclerotic heart disease of native coronary artery without angina pectoris: Secondary | ICD-10-CM | POA: Insufficient documentation

## 2017-10-23 DIAGNOSIS — R531 Weakness: Secondary | ICD-10-CM | POA: Diagnosis not present

## 2017-10-23 DIAGNOSIS — I959 Hypotension, unspecified: Secondary | ICD-10-CM | POA: Diagnosis not present

## 2017-10-23 LAB — URINALYSIS, ROUTINE W REFLEX MICROSCOPIC
Bilirubin Urine: NEGATIVE
Glucose, UA: NEGATIVE mg/dL
Hgb urine dipstick: NEGATIVE
Ketones, ur: NEGATIVE mg/dL
Nitrite: NEGATIVE
Protein, ur: NEGATIVE mg/dL
Specific Gravity, Urine: 1.011 (ref 1.005–1.030)
pH: 5 (ref 5.0–8.0)

## 2017-10-23 LAB — HEPATIC FUNCTION PANEL
ALBUMIN: 3.6 g/dL (ref 3.5–5.0)
ALT: 17 U/L (ref 14–54)
AST: 24 U/L (ref 15–41)
Alkaline Phosphatase: 55 U/L (ref 38–126)
Bilirubin, Direct: <0.1 mg/dL — ABNORMAL LOW (ref 0.1–0.5)
TOTAL PROTEIN: 7.1 g/dL (ref 6.5–8.1)
Total Bilirubin: 0.3 mg/dL (ref 0.3–1.2)

## 2017-10-23 LAB — CBC
HCT: 39.3 % (ref 36.0–46.0)
Hemoglobin: 12.8 g/dL (ref 12.0–15.0)
MCH: 29.6 pg (ref 26.0–34.0)
MCHC: 32.6 g/dL (ref 30.0–36.0)
MCV: 90.8 fL (ref 78.0–100.0)
PLATELETS: 331 10*3/uL (ref 150–400)
RBC: 4.33 MIL/uL (ref 3.87–5.11)
RDW: 13.8 % (ref 11.5–15.5)
WBC: 14.4 10*3/uL — ABNORMAL HIGH (ref 4.0–10.5)

## 2017-10-23 LAB — BASIC METABOLIC PANEL
Anion gap: 12 (ref 5–15)
BUN: 24 mg/dL — ABNORMAL HIGH (ref 6–20)
CALCIUM: 9.1 mg/dL (ref 8.9–10.3)
CO2: 23 mmol/L (ref 22–32)
CREATININE: 1.06 mg/dL — AB (ref 0.44–1.00)
Chloride: 101 mmol/L (ref 101–111)
GFR, EST NON AFRICAN AMERICAN: 52 mL/min — AB (ref >60)
Glucose, Bld: 222 mg/dL — ABNORMAL HIGH (ref 65–99)
Potassium: 4 mmol/L (ref 3.5–5.1)
Sodium: 136 mmol/L (ref 135–145)

## 2017-10-23 LAB — CBG MONITORING, ED: GLUCOSE-CAPILLARY: 152 mg/dL — AB (ref 65–99)

## 2017-10-23 LAB — I-STAT TROPONIN, ED: Troponin i, poc: 0 ng/mL (ref 0.00–0.08)

## 2017-10-23 MED ORDER — SULFAMETHOXAZOLE-TRIMETHOPRIM 800-160 MG PO TABS
1.0000 | ORAL_TABLET | Freq: Once | ORAL | Status: AC
  Administered 2017-10-23: 1 via ORAL
  Filled 2017-10-23: qty 1

## 2017-10-23 MED ORDER — SULFAMETHOXAZOLE-TRIMETHOPRIM 800-160 MG PO TABS: 1.0000 | ORAL_TABLET | Freq: Two times a day (BID) | ORAL | 0 refills | Status: AC

## 2017-10-23 MED ORDER — SODIUM CHLORIDE 0.9 % IV BOLUS
1000.0000 mL | Freq: Once | INTRAVENOUS | Status: AC
  Administered 2017-10-23: 1000 mL via INTRAVENOUS

## 2017-10-23 NOTE — ED Notes (Signed)
Pt reports that about three weeks ago she underwent cardiac monitoring with her PCP to evaluate causes of tachycardia.

## 2017-10-23 NOTE — Discharge Instructions (Addendum)
Drink plenty of fluids.  Follow-up with your doctor in 2 or 3 days.  Only take your amlodipine blood pressure medicine for now

## 2017-10-23 NOTE — ED Provider Notes (Addendum)
[facility] DEPT Provider Note   CSN: [id number] Arrival date & time: 10/23/17  1556     History   Chief Complaint Chief Complaint  Patient presents with  . Near Syncope  . Dizziness    HPI Kathleen Oliver is a 72 y.o. female.  Patient complains of weakness dizziness today.  She checked her blood pressure at home and it was in the 90s.  She is on 3 blood pressure medicines but did not take one today  The history is provided by the patient. No language interpreter was used.  Near Syncope  This is a new problem. The current episode started 3 to 5 hours ago. The problem occurs rarely. The problem has been resolved. Pertinent negatives include no chest pain, no abdominal pain and no headaches. Nothing aggravates the symptoms. Nothing relieves the symptoms. She has tried nothing for the symptoms. The treatment provided no relief.  Dizziness  Associated symptoms: no chest pain, no diarrhea and no headaches     Past Medical History:  Diagnosis Date  . Bronchial asthma   . Chronic bronchitis ([place])    "numerous years; not q yr"   . Complication of anesthesia 1999   "had 3 seizures & ended up in ICU days after I'd had anesthesia"   . Diverticulosis of colon   . Dysrhythmia    PVC's  . GERD (gastroesophageal reflux disease)   . History of hiatal hernia   . HTN (hypertension)   . Hypercholesteremia   . Hypertension   . Obesity   . Osteoarthrosis   . Pneumonia 2000's X 1  . Seizures ([place]) 1999 X 3   "days after anesthesia"- no further issues  . Stroke ([place])    /MRI from 1999;; showed I'd had had several small ones"-no residual effects.  . Type II diabetes mellitus [facility])     Patient Active Problem List   Diagnosis Date Noted  . Palpitations 05/29/2015  . Colitis 08/23/2014  . CAD (coronary artery disease) 08/23/2014  . Family history of ischemic heart disease 05/30/2013  . GERD (gastroesophageal reflux disease)   . Diabetes mellitus without  complication ([place])   . Hypertension   . Hypercholesteremia   . Obesity   . HTN (hypertension)   . Osteoarthrosis   . Anxiety     Past Surgical History:  Procedure Laterality Date  . ABDOMINAL HERNIA REPAIR  2001 X 2  . APPENDECTOMY  ~ 2000  . BIOPSY THYROID    . BLADDER SUSPENSION  2000's  . CARDIAC CATHETERIZATION  1990's? ; 11/2009  . CARPAL TUNNEL RELEASE Bilateral ~ 2003  . CATARACT EXTRACTION W/ INTRAOCULAR LENS  IMPLANT, BILATERAL Bilateral ~ 2012  . CHOLECYSTECTOMY OPEN  1984  . COLECTOMY  ~ 2000   "perforated colon due to diverticulitis"  . COLONOSCOPY WITH PROPOFOL N/A 02/18/2015   Procedure: COLONOSCOPY WITH PROPOFOL;  Surgeon: Garlan Fair, MD;  Location: [facility];  Service: Endoscopy;  Laterality: N/A;  . COLOSTOMY  ~ 2000  . COLOSTOMY REVERSAL  ~ 2000   "only needed it 9 months"  . ESOPHAGEAL DILATION  2000's X 1  . HERNIA REPAIR    . SHOULDER ARTHROSCOPY W/ ROTATOR CUFF REPAIR Right    x2 -scope '07  . TONSILLECTOMY AND ADENOIDECTOMY  1950's  . TOTAL ABDOMINAL HYSTERECTOMY  2004  . TRIGGER FINGER RELEASE Right 10/2009   "ring finger"  . TRIGGER FINGER RELEASE Right 04/2014   "index & pinky"  OB History   None      Home Medications    Prior to Admission medications   Medication Sig Start Date End Date Taking? Authorizing Provider  acetaminophen (TYLENOL) 500 MG tablet Take 500 mg by mouth every 6 (six) hours as needed for headache.    [provider]  albuterol (PROVENTIL HFA;VENTOLIN HFA) 108 (90 BASE) MCG/ACT inhaler Inhale 2 puffs into the lungs every 6 (six) hours as needed for wheezing or shortness of breath.    [provider]  amLODipine (NORVASC) 10 MG tablet Take 10 mg by mouth every morning.     [provider]  azithromycin (ZITHROMAX) 250 MG tablet Take 1 tablet (250 mg total) by mouth daily. Take first 2 tablets together, then 1 every day until finished. Patient not taking: Reported on 08/01/2015 06/19/15    Muthersbaugh, Jarrett Soho, PA-C  Cholecalciferol (VITAMIN D) 2000 UNITS CAPS Take 2,000 Units by mouth daily.    [provider]  cholestyramine light (PREVALITE) 4 G packet Take 4 g by mouth daily. Reported on 08/01/2015    [provider]  clopidogrel (PLAVIX) 75 MG tablet Take 75 mg by mouth at bedtime. Reported on 08/01/2015    [provider]  doxycycline (VIBRAMYCIN) 100 MG capsule Take 1 capsule (100 mg total) by mouth 2 (two) times daily. 08/01/15   Forde Dandy, MD  famotidine (PEPCID) 10 MG tablet Take 10 mg by mouth daily with lunch.     [provider]  hydrochlorothiazide (HYDRODIURIL) 25 MG tablet Take 25 mg by mouth every morning.    [provider]  HYDROcodone-acetaminophen (NORCO/VICODIN) 5-325 MG tablet Take 1-2 tablets by mouth every 6 (six) hours as needed for moderate pain or severe pain. 06/19/15   Muthersbaugh, Jarrett Soho, PA-C  latanoprost (XALATAN) 0.005 % ophthalmic solution Place 1 drop into both eyes at bedtime.  05/15/13   [provider]  lisinopril (PRINIVIL,ZESTRIL) 10 MG tablet Take 10 mg by mouth every morning.     [provider]  metFORMIN (GLUCOPHAGE-XR) 500 MG 24 hr tablet Take 500 mg by mouth every evening.    [provider]  metroNIDAZOLE (FLAGYL) 500 MG tablet Take 1 tablet (500 mg total) by mouth 3 (three) times daily. Patient not taking: Reported on 08/01/2015 06/19/15   Muthersbaugh, Jarrett Soho, PA-C  Naphazoline-Pheniramine (OPCON-A OP) Apply 1 drop to eye 2 (two) times daily as needed (allergies.).    [provider]  Neomycin-Bacitracin-Polymyxin (HCA TRIPLE ANTIBIOTIC OINTMENT EX) Apply 1 application topically daily as needed (wounds).  04/08/15   [provider]  ondansetron (ZOFRAN ODT) 8 MG disintegrating tablet 8mg  ODT q4 hours prn nausea Patient taking differently: Take 8 mg by mouth every 4 (four) hours as needed for nausea or vomiting.  06/19/15   Muthersbaugh, Jarrett Soho, PA-C    ondansetron (ZOFRAN) 4 MG tablet Take 1 tablet (4 mg total) by mouth every 8 (eight) hours as needed for nausea or vomiting. 08/01/15   Forde Dandy, MD  Simethicone (GAS-X PO) Take 1 capsule by mouth daily as needed (abdominal pain).    [provider]  sitaGLIPtin (JANUVIA) 100 MG tablet Take 100 mg by mouth daily.    [provider]  sodium chloride (OCEAN) 0.65 % SOLN nasal spray Place 1-2 sprays into both nostrils daily as needed for congestion.    [provider]  sulfamethoxazole-trimethoprim (BACTRIM DS,SEPTRA DS) 800-160 MG tablet Take 1 tablet by mouth 2 (two) times daily for 7 days. 10/23/17 10/30/17  Milton Ferguson, MD  traMADol (ULTRAM) 50 MG tablet Take 50 mg by mouth every 6 (six) hours as needed (Back pain.).     [provider]    Family History Family History  Problem Relation Age of Onset  . Diverticulitis Mother   . Hypertension Mother   . Heart attack Father   . Heart attack Brother   . Diabetes Brother   . Hypertension Sister     Social History Social History   Tobacco Use  . Smoking status: Former Smoker    Packs/day: 2.00    Years: 20.00    Pack years: 40.00    Types: Cigarettes    Last attempt to quit: 02/15/1982    Years since quitting: 35.7  . Smokeless tobacco: Never Used  Substance Use Topics  . Alcohol use: Yes    Comment: "stopped drinking [id number]"  . Drug use: No     Allergies   Cinobac [cinoxacin]; Codeine; Penicillins; and Tizanidine   Review of Systems Review of Systems  Constitutional: Negative for appetite change and fatigue.  HENT: Negative for congestion, ear discharge and sinus pressure.   Eyes: Negative for discharge.  Respiratory: Negative for cough.   Cardiovascular: Positive for near-syncope. Negative for chest pain.  Gastrointestinal: Negative for abdominal pain and diarrhea.  Genitourinary: Negative for frequency and hematuria.  Musculoskeletal: Negative for back pain.  Skin: Negative for  rash.  Neurological: Positive for dizziness. Negative for seizures and headaches.  Psychiatric/Behavioral: Negative for hallucinations.     Physical Exam Updated Vital Signs BP 134/67   Pulse 71   Temp 97.9 F (36.6 C) (Oral)   Resp 13   Ht 5\' 1"  (1.549 m)   Wt 76.9 kg (169 lb 8 oz)   SpO2 97%   BMI 32.03 kg/m   Physical Exam  Constitutional: She is oriented to person, place, and time. She appears well-developed.  HENT:  Head: Normocephalic.  Eyes: Conjunctivae and EOM are normal. No scleral icterus.  Neck: Neck supple. No thyromegaly present.  Cardiovascular: Normal rate and regular rhythm. Exam reveals no gallop and no friction rub.  No murmur heard. Pulmonary/Chest: No stridor. She has no wheezes. She has no rales. She exhibits no tenderness.  Abdominal: She exhibits no distension. There is no tenderness. There is no rebound.  Musculoskeletal: Normal range of motion. She exhibits no edema.  Lymphadenopathy:    She has no cervical adenopathy.  Neurological: She is oriented to person, place, and time. She exhibits normal muscle tone. Coordination normal.  Skin: No rash noted. No erythema.  Psychiatric: She has a normal mood and affect. Her behavior is normal.     ED Treatments / Results  Labs (all labs ordered are listed, but only abnormal results are displayed) Labs Reviewed  BASIC METABOLIC PANEL - Abnormal; Notable for the following components:      Result Value   Glucose, Bld 222 (*)    BUN 24 (*)    Creatinine, Ser 1.06 (*)    GFR calc non Af Amer 52 (*)    All other components within normal limits  CBC - Abnormal; Notable for the following components:   WBC 14.4 (*)    All other components within normal limits  URINALYSIS, ROUTINE W REFLEX MICROSCOPIC - Abnormal; Notable for the following components:   APPearance HAZY (*)    Leukocytes, UA MODERATE (*)    Bacteria, UA RARE (*)    Non Squamous Epithelial 0-5 (*)    All other components  within normal  limits  HEPATIC FUNCTION PANEL - Abnormal; Notable for the following components:   Bilirubin, Direct <0.1 (*)    All other components within normal limits  CBG MONITORING, ED - Abnormal; Notable for the following components:   Glucose-Capillary 152 (*)    All other components within normal limits  URINE CULTURE  I-STAT TROPONIN, ED    EKG EKG Interpretation  Date/Time:  "Sunday October 23 2017 16:03:22 EDT Ventricular Rate:  85 PR Interval:    QRS Duration: 106 QT Interval:  356 QTC Calculation: 424 R Axis:   53 Text Interpretation:  Sinus rhythm Low voltage, precordial leads Confirmed by Jasamine Pottinger ([id number]) on 10/23/2017 7:26:05 PM   Radiology Dg Chest 2 View  Result Date: 10/23/2017 CLINICAL DATA:  Weakness and shortness of breath since this morning. EXAM: CHEST - 2 VIEW COMPARISON:  CT chest and chest x-ray dated May 01, 2015. FINDINGS: The heart size and mediastinal contours are within normal limits. Both lungs are clear. The visualized skeletal structures are unremarkable. IMPRESSION: No active cardiopulmonary disease. Electronically Signed   By: William T Derry M.D.   On: 10/23/2017 19:56    Procedures Procedures (including critical care time)  Medications Ordered in ED Medications  sulfamethoxazole-trimethoprim (BACTRIM DS,SEPTRA DS) 800-160 MG per tablet 1 tablet (has no administration in time range)  sodium chloride 0.9 % bolus 1,000 mL (0 mLs Intravenous Stopped 10/23/17 2201)     Initial Impression / Assessment and Plan / ED Course  I have reviewed the triage vital signs and the nursing notes.  Pertinent labs & imaging results that were available during my care of the patient were reviewed by me and considered in my medical decision making (see chart for details).   Patient's labs were reviewed and shows patient to be dehydrated along with urinary tract infection.  Patient felt much better after IV fluids.  She will be discharged home with Bactrim and told to  only take her amlodipine for her blood pressure for right now and follow-up with her PCP in 2 to 3 days.  Pt will return if any problems before she sees her md    Final Clinical Impressions(s) / ED Diagnoses   Final diagnoses:  Acute cystitis without hematuria    ED Discharge Orders        Ordered    sulfamethoxazole-trimethoprim (BACTRIM DS,SEPTRA DS) 800-160 MG tablet  2 times daily     04" /28/19 2311       Milton Ferguson, MD 10/23/17 2315    Milton Ferguson, MD 10/23/17 2316

## 2017-10-23 NOTE — ED Triage Notes (Signed)
Pt reports that she felt dizzy, near syncope, having right gland pains when eating/chewing. Was seen at [facility] walk in clinic and sent to ED. If wearing a heart monitor but hasnt got results yet , reports had to wear that due to heart rate would jump up when up moving around.

## 2017-10-25 LAB — URINE CULTURE

## 2017-10-26 DIAGNOSIS — I471 Supraventricular tachycardia: Secondary | ICD-10-CM | POA: Diagnosis not present

## 2017-10-26 DIAGNOSIS — Z7984 Long term (current) use of oral hypoglycemic drugs: Secondary | ICD-10-CM | POA: Diagnosis not present

## 2017-10-26 DIAGNOSIS — I10 Essential (primary) hypertension: Secondary | ICD-10-CM | POA: Diagnosis not present

## 2017-10-26 DIAGNOSIS — R000 Tachycardia, unspecified: Secondary | ICD-10-CM | POA: Diagnosis not present

## 2017-10-26 DIAGNOSIS — E119 Type 2 diabetes mellitus without complications: Secondary | ICD-10-CM | POA: Diagnosis not present

## 2017-10-26 DIAGNOSIS — N3000 Acute cystitis without hematuria: Secondary | ICD-10-CM | POA: Diagnosis not present

## 2017-10-26 DIAGNOSIS — Z79899 Other long term (current) drug therapy: Secondary | ICD-10-CM | POA: Diagnosis not present

## 2017-11-14 DIAGNOSIS — E119 Type 2 diabetes mellitus without complications: Secondary | ICD-10-CM | POA: Diagnosis not present

## 2017-11-14 DIAGNOSIS — H401131 Primary open-angle glaucoma, bilateral, mild stage: Secondary | ICD-10-CM | POA: Diagnosis not present

## 2017-11-14 DIAGNOSIS — H26493 Other secondary cataract, bilateral: Secondary | ICD-10-CM | POA: Diagnosis not present

## 2017-11-14 DIAGNOSIS — Z961 Presence of intraocular lens: Secondary | ICD-10-CM | POA: Diagnosis not present

## 2017-11-25 DIAGNOSIS — E1140 Type 2 diabetes mellitus with diabetic neuropathy, unspecified: Secondary | ICD-10-CM | POA: Diagnosis not present

## 2017-11-25 DIAGNOSIS — Z7984 Long term (current) use of oral hypoglycemic drugs: Secondary | ICD-10-CM | POA: Diagnosis not present

## 2017-11-25 DIAGNOSIS — I10 Essential (primary) hypertension: Secondary | ICD-10-CM | POA: Diagnosis not present

## 2017-12-01 DIAGNOSIS — Z7984 Long term (current) use of oral hypoglycemic drugs: Secondary | ICD-10-CM | POA: Diagnosis not present

## 2017-12-01 DIAGNOSIS — E7800 Pure hypercholesterolemia, unspecified: Secondary | ICD-10-CM | POA: Diagnosis not present

## 2017-12-01 DIAGNOSIS — S82014A Nondisplaced osteochondral fracture of right patella, initial encounter for closed fracture: Secondary | ICD-10-CM | POA: Diagnosis not present

## 2017-12-01 DIAGNOSIS — I10 Essential (primary) hypertension: Secondary | ICD-10-CM | POA: Diagnosis not present

## 2017-12-01 DIAGNOSIS — M25561 Pain in right knee: Secondary | ICD-10-CM | POA: Diagnosis not present

## 2017-12-01 DIAGNOSIS — M545 Low back pain: Secondary | ICD-10-CM | POA: Diagnosis not present

## 2017-12-01 DIAGNOSIS — G720 Drug-induced myopathy: Secondary | ICD-10-CM | POA: Diagnosis not present

## 2017-12-01 DIAGNOSIS — E1169 Type 2 diabetes mellitus with other specified complication: Secondary | ICD-10-CM | POA: Diagnosis not present

## 2017-12-23 DIAGNOSIS — Z7984 Long term (current) use of oral hypoglycemic drugs: Secondary | ICD-10-CM | POA: Diagnosis not present

## 2017-12-23 DIAGNOSIS — E119 Type 2 diabetes mellitus without complications: Secondary | ICD-10-CM | POA: Diagnosis not present

## 2017-12-23 DIAGNOSIS — I10 Essential (primary) hypertension: Secondary | ICD-10-CM | POA: Diagnosis not present

## 2017-12-28 DIAGNOSIS — M179 Osteoarthritis of knee, unspecified: Secondary | ICD-10-CM | POA: Diagnosis not present

## 2017-12-28 DIAGNOSIS — R2689 Other abnormalities of gait and mobility: Secondary | ICD-10-CM | POA: Diagnosis not present

## 2017-12-28 DIAGNOSIS — M545 Low back pain: Secondary | ICD-10-CM | POA: Diagnosis not present

## 2017-12-28 DIAGNOSIS — S82014A Nondisplaced osteochondral fracture of right patella, initial encounter for closed fracture: Secondary | ICD-10-CM | POA: Diagnosis not present

## 2017-12-28 DIAGNOSIS — M25561 Pain in right knee: Secondary | ICD-10-CM | POA: Diagnosis not present

## 2018-01-11 DIAGNOSIS — M1711 Unilateral primary osteoarthritis, right knee: Secondary | ICD-10-CM | POA: Diagnosis not present

## 2018-01-17 DIAGNOSIS — E1169 Type 2 diabetes mellitus with other specified complication: Secondary | ICD-10-CM | POA: Diagnosis not present

## 2018-01-17 DIAGNOSIS — Z7984 Long term (current) use of oral hypoglycemic drugs: Secondary | ICD-10-CM | POA: Diagnosis not present

## 2018-01-17 DIAGNOSIS — E1140 Type 2 diabetes mellitus with diabetic neuropathy, unspecified: Secondary | ICD-10-CM | POA: Diagnosis not present

## 2018-01-17 DIAGNOSIS — I10 Essential (primary) hypertension: Secondary | ICD-10-CM | POA: Diagnosis not present

## 2018-01-18 DIAGNOSIS — M1711 Unilateral primary osteoarthritis, right knee: Secondary | ICD-10-CM | POA: Diagnosis not present

## 2018-01-20 DIAGNOSIS — M1711 Unilateral primary osteoarthritis, right knee: Secondary | ICD-10-CM | POA: Diagnosis not present

## 2018-01-24 DIAGNOSIS — M1711 Unilateral primary osteoarthritis, right knee: Secondary | ICD-10-CM | POA: Diagnosis not present

## 2018-01-27 DIAGNOSIS — M1711 Unilateral primary osteoarthritis, right knee: Secondary | ICD-10-CM | POA: Diagnosis not present

## 2018-01-27 DIAGNOSIS — M25561 Pain in right knee: Secondary | ICD-10-CM | POA: Diagnosis not present

## 2018-01-28 DIAGNOSIS — M179 Osteoarthritis of knee, unspecified: Secondary | ICD-10-CM | POA: Diagnosis not present

## 2018-01-31 DIAGNOSIS — M1711 Unilateral primary osteoarthritis, right knee: Secondary | ICD-10-CM | POA: Diagnosis not present

## 2018-02-07 DIAGNOSIS — M1711 Unilateral primary osteoarthritis, right knee: Secondary | ICD-10-CM | POA: Diagnosis not present

## 2018-02-10 DIAGNOSIS — M1711 Unilateral primary osteoarthritis, right knee: Secondary | ICD-10-CM | POA: Diagnosis not present

## 2018-02-10 NOTE — Pre-Procedure Instructions (Signed)
Kathleen Oliver  02/10/2018      [id number] - [place], [address] [place] [address] Phone: [phone] Fax: [phone]    Your procedure is scheduled on 02/24/18.  Report to [facility] Admitting at 8 A.M.  Call this number if you have problems the morning of surgery:  [phone]   Remember:  Do not eat or drink after midnight.   Take these medicines the morning of surgery with A SIP OF WATER ---tylenol,all inhalers,neurontin,ultram    Do not wear jewelry, make-up or nail polish.  Do not wear lotions, powders, or perfumes, or deodorant.  Do not shave 48 hours prior to surgery.  Men may shave face and neck.  Do not bring valuables to the hospital.  [facility] is not responsible for any belongings or valuables.  Contacts, dentures or bridgework may not be worn into surgery.  Leave your suitcase in the car.  After surgery it may be brought to your room.  For patients admitted to the hospital, discharge time will be determined by your treatment team.  Patients discharged the day of surgery will not be allowed to drive home.   Name and phone number of your driver:  Do not take any aspirin,anti-inflammatories,vitamins,or herbal supplements 5-7 days prior to surgery. Special instructions:  Kathleen Oliver - Preparing for Surgery  Before surgery, you can play an important role.  Because skin is not sterile, your skin needs to be as free of germs as possible.  You can reduce the number of germs on you skin by washing with CHG (chlorahexidine gluconate) soap before surgery.  CHG is an antiseptic cleaner which kills germs and bonds with the skin to continue killing germs even after washing.  Oral Hygiene is also important in reducing the risk of infection.  Remember to brush your teeth with your regular toothpaste the morning of surgery.  Please DO NOT use if you have an allergy to CHG or antibacterial soaps.  If your skin becomes  reddened/irritated stop using the CHG and inform your nurse when you arrive at Short Stay.  Do not shave (including legs and underarms) for at least 48 hours prior to the first CHG shower.  You may shave your face.  Please follow these instructions carefully:   1.  Shower with CHG Soap the night before surgery and the morning of Surgery.  2.  If you choose to wash your hair, wash your hair first as usual with your normal shampoo.  3.  After you shampoo, rinse your hair and body thoroughly to remove the shampoo. 4.  Use CHG as you would any other liquid soap.  You can apply chg directly to the skin and wash gently with a      scrungie or washcloth.           5.  Apply the CHG Soap to your body ONLY FROM THE NECK DOWN.   Do not use on open wounds or open sores. Avoid contact with your eyes, ears, mouth and genitals (private parts).  Wash genitals (private parts) with your normal soap.  6.  Wash thoroughly, paying special attention to the area where your surgery will be performed.  7.  Thoroughly rinse your body with warm water from the neck down.  8.  DO NOT shower/wash with your normal soap after using and rinsing off the CHG Soap.  9.  Pat yourself dry with a clean towel.  10.  Wear clean pajamas.            11.  Place clean sheets on your bed the night of your first shower and do not sleep with pets.  Day of Surgery  Do not apply any lotions/deoderants the morning of surgery.   Please wear clean clothes to the hospital/surgery center. Remember to brush your teeth with toothpaste.    Please read over the following fact sheets that you were given.

## 2018-02-13 ENCOUNTER — Encounter (HOSPITAL_COMMUNITY)
Admission: RE | Admit: 2018-02-13 | Discharge: 2018-02-13 | Disposition: A | Discharge status: Home / Self Care | Payer: Medicare HMO | Source: Ambulatory Visit | Attending: Orthopedic Surgery | Admitting: Orthopedic Surgery

## 2018-02-13 ENCOUNTER — Encounter (HOSPITAL_COMMUNITY): Payer: Self-pay

## 2018-02-13 DIAGNOSIS — I10 Essential (primary) hypertension: Secondary | ICD-10-CM | POA: Insufficient documentation

## 2018-02-13 DIAGNOSIS — Z01812 Encounter for preprocedural laboratory examination: Secondary | ICD-10-CM | POA: Insufficient documentation

## 2018-02-13 DIAGNOSIS — K219 Gastro-esophageal reflux disease without esophagitis: Secondary | ICD-10-CM | POA: Diagnosis not present

## 2018-02-13 HISTORY — DX: Urinary tract infection, site not specified: N39.0

## 2018-02-13 LAB — CBC
HCT: 38.2 % (ref 36.0–46.0)
Hemoglobin: 11.9 g/dL — ABNORMAL LOW (ref 12.0–15.0)
MCH: 29.1 pg (ref 26.0–34.0)
MCHC: 31.2 g/dL (ref 30.0–36.0)
MCV: 93.4 fL (ref 78.0–100.0)
Platelets: 286 10*3/uL (ref 150–400)
RBC: 4.09 MIL/uL (ref 3.87–5.11)
RDW: 13.8 % (ref 11.5–15.5)
WBC: 5.8 10*3/uL (ref 4.0–10.5)

## 2018-02-13 LAB — BASIC METABOLIC PANEL
ANION GAP: 9 (ref 5–15)
BUN: 18 mg/dL (ref 8–23)
CO2: 22 mmol/L (ref 22–32)
Calcium: 9 mg/dL (ref 8.9–10.3)
Chloride: 107 mmol/L (ref 98–111)
Creatinine, Ser: 0.8 mg/dL (ref 0.44–1.00)
GFR calc Af Amer: >60 mL/min (ref >60)
Glucose, Bld: 115 mg/dL — ABNORMAL HIGH (ref 70–99)
POTASSIUM: 4.5 mmol/L (ref 3.5–5.1)
Sodium: 138 mmol/L (ref 135–145)

## 2018-02-13 LAB — SURGICAL PCR SCREEN
MRSA, PCR: NEGATIVE
Staphylococcus aureus: NEGATIVE

## 2018-02-13 LAB — URINALYSIS, ROUTINE W REFLEX MICROSCOPIC
BILIRUBIN URINE: NEGATIVE
Bacteria, UA: NONE SEEN
Glucose, UA: NEGATIVE mg/dL
Hgb urine dipstick: NEGATIVE
KETONES UR: NEGATIVE mg/dL
NITRITE: NEGATIVE
Protein, ur: NEGATIVE mg/dL
Specific Gravity, Urine: 1.003 — ABNORMAL LOW (ref 1.005–1.030)
pH: 6 (ref 5.0–8.0)

## 2018-02-13 LAB — GLUCOSE, CAPILLARY: GLUCOSE-CAPILLARY: 149 mg/dL — AB (ref 70–99)

## 2018-02-13 LAB — HEMOGLOBIN A1C
Hgb A1c MFr Bld: 6.7 % — ABNORMAL HIGH (ref 4.8–5.6)
Mean Plasma Glucose: 145.59 mg/dL

## 2018-02-13 NOTE — Progress Notes (Signed)
Will repeat u/a due to fact pt had a recent uti. She also requested one. Have not received md orders yet

## 2018-02-15 ENCOUNTER — Other Ambulatory Visit: Payer: Self-pay | Admitting: Geriatric Medicine

## 2018-02-15 DIAGNOSIS — E559 Vitamin D deficiency, unspecified: Secondary | ICD-10-CM | POA: Diagnosis not present

## 2018-02-15 DIAGNOSIS — K9089 Other intestinal malabsorption: Secondary | ICD-10-CM | POA: Diagnosis not present

## 2018-02-15 DIAGNOSIS — E041 Nontoxic single thyroid nodule: Secondary | ICD-10-CM | POA: Diagnosis not present

## 2018-02-15 DIAGNOSIS — I10 Essential (primary) hypertension: Secondary | ICD-10-CM | POA: Diagnosis not present

## 2018-02-15 DIAGNOSIS — Z0000 Encounter for general adult medical examination without abnormal findings: Secondary | ICD-10-CM | POA: Diagnosis not present

## 2018-02-15 DIAGNOSIS — Z1389 Encounter for screening for other disorder: Secondary | ICD-10-CM | POA: Diagnosis not present

## 2018-02-15 DIAGNOSIS — E1140 Type 2 diabetes mellitus with diabetic neuropathy, unspecified: Secondary | ICD-10-CM | POA: Diagnosis not present

## 2018-02-15 DIAGNOSIS — Z79899 Other long term (current) drug therapy: Secondary | ICD-10-CM | POA: Diagnosis not present

## 2018-02-15 DIAGNOSIS — I700 Atherosclerosis of aorta: Secondary | ICD-10-CM | POA: Diagnosis not present

## 2018-02-15 DIAGNOSIS — E7800 Pure hypercholesterolemia, unspecified: Secondary | ICD-10-CM | POA: Diagnosis not present

## 2018-02-15 DIAGNOSIS — E669 Obesity, unspecified: Secondary | ICD-10-CM | POA: Diagnosis not present

## 2018-02-15 NOTE — H&P (Signed)
Anticipated LOS equal to or greater than 2 midnights due to - Age 71 and older with one or more of the following:  - Obesity  - Expected need for hospital services (PT, OT, Nursing) required for safe  discharge  - Anticipated need for postoperative skilled nursing care or inpatient rehab      Kathleen Oliver is an 72 y.o. female.    Chief Complaint: right knee pain  HPI: Pt is a 72 y.o. female complaining of right knee pain for multiple years. Pain had continually increased since the beginning. X-rays in the clinic show end-stage arthritic changes of the right knee. Pt has tried various conservative treatments which have failed to alleviate their symptoms, including injections and therapy. Various options are discussed with the patient. Risks, benefits and expectations were discussed with the patient. Patient understand the risks, benefits and expectations and wishes to proceed with surgery.   PCP:  [name], MD  D/C Plans: Home  PMH: Past Medical History:  Diagnosis Date  . Bronchial asthma   . Chronic bronchitis ([place])    "numerous years; not q yr"   . Complication of anesthesia 1999   "had 3 seizures & ended up in ICU days after I'd had anesthesia"   . Diverticulosis of colon   . Dysrhythmia    PVC's  . GERD (gastroesophageal reflux disease)   . History of hiatal hernia   . HTN (hypertension)   . Hypercholesteremia   . Hypertension   . Obesity   . Osteoarthrosis   . Pneumonia 2000's X 1  . Recurrent UTI   . Seizures ([place]) 1999 X 3   "days after anesthesia"- no further issues  . Stroke ([place])    /MRI from 1999;; showed I'd had had several small ones"-no residual effects.  . Type II diabetes mellitus ([place])     PSH: Past Surgical History:  Procedure Laterality Date  . ABDOMINAL HERNIA REPAIR  2001 X 2  . APPENDECTOMY  ~ 2000  . BIOPSY THYROID    . BLADDER SUSPENSION  2000's  . CARDIAC CATHETERIZATION  1990's? ; 11/2009  . CARPAL TUNNEL RELEASE Bilateral ~ 2003    . CATARACT EXTRACTION W/ INTRAOCULAR LENS  IMPLANT, BILATERAL Bilateral ~ 2012  . CHOLECYSTECTOMY OPEN  1984  . COLECTOMY  ~ 2000   "perforated colon due to diverticulitis"  . COLONOSCOPY WITH PROPOFOL N/A 02/18/2015   Procedure: COLONOSCOPY WITH PROPOFOL;  Surgeon: Garlan Fair, MD;  Location: [facility];  Service: Endoscopy;  Laterality: N/A;  . COLOSTOMY  ~ 2000  . COLOSTOMY REVERSAL  ~ 2000   "only needed it 9 months"  . ESOPHAGEAL DILATION  2000's X 1  . HERNIA REPAIR    . SHOULDER ARTHROSCOPY W/ ROTATOR CUFF REPAIR Right    x2 -scope '07  . TONSILLECTOMY AND ADENOIDECTOMY  1950's  . TOTAL ABDOMINAL HYSTERECTOMY  2004  . TRIGGER FINGER RELEASE Right 10/2009   "ring finger"  . TRIGGER FINGER RELEASE Right 04/2014   "index & pinky"    Social History:  reports that she quit smoking about 36 years ago. Her smoking use included cigarettes. She has a 40.00 pack-year smoking history. She has never used smokeless tobacco. She reports that she drinks alcohol. She reports that she does not use drugs.  Allergies:  Allergies  Allergen Reactions  . Cinobac [Cinoxacin]     hives  . Codeine Itching  . Penicillins Hives    Has patient had a PCN reaction causing  immediate rash, facial/tongue/throat swelling, SOB or lightheadedness with hypotension: Yes Has patient had a PCN reaction causing severe rash involving mucus membranes or skin necrosis: Yes Has patient had a PCN reaction that required hospitalization No Has patient had a PCN reaction occurring within the last 10 years: No If all of the above answers are "NO", then may proceed with Cephalosporin use.    . Tizanidine Other (See Comments)    Heart flutter     Medications: No current facility-administered medications for this encounter.    Current Outpatient Medications  Medication Sig Dispense Refill  . acetaminophen (TYLENOL) 500 MG tablet Take 500 mg by mouth 2 (two) times daily as needed for moderate pain or headache.      . albuterol (PROVENTIL HFA;VENTOLIN HFA) 108 (90 BASE) MCG/ACT inhaler Inhale 2 puffs into the lungs every 6 (six) hours as needed for wheezing or shortness of breath.    Marland Kitchen amLODipine (NORVASC) 10 MG tablet Take 10 mg by mouth at bedtime.     . Cholecalciferol (VITAMIN D) 2000 UNITS CAPS Take 2,000 Units by mouth daily.    . clopidogrel (PLAVIX) 75 MG tablet Take 75 mg by mouth at bedtime.     Marland Kitchen dexlansoprazole (DEXILANT) 60 MG capsule Take 60 mg by mouth daily before supper.    . Evolocumab with Infusor (Tillatoba) 420 MG/3.5ML SOCT Inject 420 mg into the skin every 30 (thirty) days.    Marland Kitchen gabapentin (NEURONTIN) 100 MG capsule Take 100 mg by mouth at bedtime.    Marland Kitchen glimepiride (AMARYL) 2 MG tablet Take 1 mg by mouth 2 (two) times daily.    Marland Kitchen latanoprost (XALATAN) 0.005 % ophthalmic solution Place 1 drop into both eyes at bedtime.     . lidocaine (LMX) 4 % cream Apply 1 application topically as needed (pain).    . Lidocaine 4 % PTCH Apply 1 patch topically daily as needed (pain).    Marland Kitchen lisinopril (PRINIVIL,ZESTRIL) 10 MG tablet Take 10 mg by mouth every morning.     . loperamide (IMODIUM) 1 MG/5ML solution Take 1 mg by mouth as needed for diarrhea or loose stools.    . metFORMIN (GLUCOPHAGE-XR) 500 MG 24 hr tablet Take 500 mg by mouth 2 (two) times daily.     . Naphazoline-Pheniramine (OPCON-A OP) Apply 1 drop to eye 2 (two) times daily as needed (allergies.).    Marland Kitchen Simethicone (GAS-X PO) Take 1 capsule by mouth daily as needed (abdominal pain).    . sodium chloride (OCEAN) 0.65 % SOLN nasal spray Place 1 spray into both nostrils daily as needed for congestion.     . traMADol (ULTRAM) 50 MG tablet Take 50 mg by mouth every 6 (six) hours as needed (Back pain.).       No results found for this or any previous visit (from the past 48 hour(s)). No results found.  ROS: Pain with rom of the right upper extremity  Physical Exam: Alert and oriented 72 y.o. female in no acute  distress Cranial nerves 2-12 intact Cervical spine: full rom with no tenderness, nv intact distally Chest: active breath sounds bilaterally, no wheeze rhonchi or rales Heart: regular rate and rhythm, no murmur Abd: non tender non distended with active bowel sounds Hip is stable with rom  Right knee with moderate medial and lateral joint line tenderness nv intact distally No rashes  Antalgic gait  Assessment/Plan Assessment: right knee end stage osteoarthritis  Plan:  Patient will undergo a right total knee  replacement by Dr. Veverly Fells at [facility]. Risks benefits and expectations were discussed with the patient. Patient understand risks, benefits and expectations and wishes to proceed. Preoperative templating of the joint replacement has been completed, documented, and submitted to the Operating Room personnel in order to optimize intra-operative equipment management.   Merla Riches PA-C, MPAS [facility] is now [employer] [address], [place], [place], [place] [address] Phone: [phone] [email or web address]  [employer]

## 2018-02-17 DIAGNOSIS — Z780 Asymptomatic menopausal state: Secondary | ICD-10-CM | POA: Diagnosis not present

## 2018-02-21 ENCOUNTER — Ambulatory Visit
Admission: RE | Admit: 2018-02-21 | Discharge: 2018-02-21 | Disposition: A | Discharge status: Home / Self Care | Payer: Medicare HMO | Source: Ambulatory Visit | Attending: Geriatric Medicine | Admitting: Geriatric Medicine

## 2018-02-21 DIAGNOSIS — E041 Nontoxic single thyroid nodule: Secondary | ICD-10-CM | POA: Diagnosis not present

## 2018-02-23 MED ORDER — SODIUM CHLORIDE 0.9 % IV SOLN
1000.0000 mg | INTRAVENOUS | Status: AC
  Administered 2018-02-24: 1000 mg via INTRAVENOUS
  Filled 2018-02-23: qty 1000

## 2018-02-24 ENCOUNTER — Encounter (HOSPITAL_COMMUNITY)
Admission: RE | Disposition: A | Discharge status: Home Health | Payer: Self-pay | Source: Ambulatory Visit | Attending: Orthopedic Surgery

## 2018-02-24 ENCOUNTER — Inpatient Hospital Stay (HOSPITAL_COMMUNITY): Payer: Medicare HMO

## 2018-02-24 ENCOUNTER — Inpatient Hospital Stay (HOSPITAL_COMMUNITY)
Admission: RE | Admit: 2018-02-24 | Discharge: 2018-02-27 | DRG: 470 | Disposition: A | Discharge status: Home Health | Payer: Medicare HMO | Source: Ambulatory Visit | Attending: Orthopedic Surgery | Admitting: Orthopedic Surgery

## 2018-02-24 ENCOUNTER — Encounter (HOSPITAL_COMMUNITY): Payer: Self-pay | Admitting: General Practice

## 2018-02-24 ENCOUNTER — Other Ambulatory Visit: Payer: Self-pay

## 2018-02-24 ENCOUNTER — Inpatient Hospital Stay (HOSPITAL_COMMUNITY): Payer: Medicare HMO | Admitting: Anesthesiology

## 2018-02-24 DIAGNOSIS — K219 Gastro-esophageal reflux disease without esophagitis: Secondary | ICD-10-CM | POA: Diagnosis present

## 2018-02-24 DIAGNOSIS — Z8673 Personal history of transient ischemic attack (TIA), and cerebral infarction without residual deficits: Secondary | ICD-10-CM

## 2018-02-24 DIAGNOSIS — G8918 Other acute postprocedural pain: Secondary | ICD-10-CM | POA: Diagnosis not present

## 2018-02-24 DIAGNOSIS — Z7902 Long term (current) use of antithrombotics/antiplatelets: Secondary | ICD-10-CM | POA: Diagnosis not present

## 2018-02-24 DIAGNOSIS — I10 Essential (primary) hypertension: Secondary | ICD-10-CM | POA: Diagnosis present

## 2018-02-24 DIAGNOSIS — Z6830 Body mass index (BMI) 30.0-30.9, adult: Secondary | ICD-10-CM

## 2018-02-24 DIAGNOSIS — Z87891 Personal history of nicotine dependence: Secondary | ICD-10-CM | POA: Diagnosis not present

## 2018-02-24 DIAGNOSIS — Z471 Aftercare following joint replacement surgery: Secondary | ICD-10-CM | POA: Diagnosis not present

## 2018-02-24 DIAGNOSIS — E7800 Pure hypercholesterolemia, unspecified: Secondary | ICD-10-CM | POA: Diagnosis present

## 2018-02-24 DIAGNOSIS — Z7984 Long term (current) use of oral hypoglycemic drugs: Secondary | ICD-10-CM

## 2018-02-24 DIAGNOSIS — M1711 Unilateral primary osteoarthritis, right knee: Secondary | ICD-10-CM | POA: Diagnosis not present

## 2018-02-24 DIAGNOSIS — E119 Type 2 diabetes mellitus without complications: Secondary | ICD-10-CM | POA: Diagnosis present

## 2018-02-24 DIAGNOSIS — E669 Obesity, unspecified: Secondary | ICD-10-CM | POA: Diagnosis not present

## 2018-02-24 DIAGNOSIS — Z96651 Presence of right artificial knee joint: Secondary | ICD-10-CM

## 2018-02-24 HISTORY — PX: TOTAL KNEE ARTHROPLASTY: SHX125

## 2018-02-24 LAB — GLUCOSE, CAPILLARY
GLUCOSE-CAPILLARY: 132 mg/dL — AB (ref 70–99)
GLUCOSE-CAPILLARY: 277 mg/dL — AB (ref 70–99)
Glucose-Capillary: 181 mg/dL — ABNORMAL HIGH (ref 70–99)
Glucose-Capillary: 287 mg/dL — ABNORMAL HIGH (ref 70–99)

## 2018-02-24 SURGERY — ARTHROPLASTY, KNEE, TOTAL
Anesthesia: General | Site: Knee | Laterality: Right

## 2018-02-24 MED ORDER — METOCLOPRAMIDE HCL 5 MG/ML IJ SOLN: 5.0000 mg | Freq: Three times a day (TID) | INTRAMUSCULAR | Status: DC | PRN

## 2018-02-24 MED ORDER — PROPOFOL 10 MG/ML IV BOLUS
INTRAVENOUS | Status: DC | PRN
  Administered 2018-02-24: 150 mg via INTRAVENOUS

## 2018-02-24 MED ORDER — MENTHOL 3 MG MT LOZG
1.0000 | LOZENGE | OROMUCOSAL | Status: DC | PRN
  Administered 2018-02-26: 3 mg via ORAL
  Filled 2018-02-24: qty 9

## 2018-02-24 MED ORDER — SALINE SPRAY 0.65 % NA SOLN
1.0000 | Freq: Every day | NASAL | Status: DC | PRN
  Filled 2018-02-24: qty 44

## 2018-02-24 MED ORDER — CLINDAMYCIN PHOSPHATE 600 MG/50ML IV SOLN
600.0000 mg | Freq: Four times a day (QID) | INTRAVENOUS | Status: AC
  Administered 2018-02-24 (×2): 600 mg via INTRAVENOUS
  Filled 2018-02-24 (×2): qty 50

## 2018-02-24 MED ORDER — GLIMEPIRIDE 1 MG PO TABS
1.0000 mg | ORAL_TABLET | Freq: Two times a day (BID) | ORAL | Status: DC
  Administered 2018-02-24 – 2018-02-27 (×7): 1 mg via ORAL
  Filled 2018-02-24 (×8): qty 1

## 2018-02-24 MED ORDER — ENSURE ENLIVE PO LIQD
237.0000 mL | Freq: Two times a day (BID) | ORAL | Status: DC
  Administered 2018-02-24 – 2018-02-25 (×2): 237 mL via ORAL

## 2018-02-24 MED ORDER — NAPHAZOLINE-PHENIRAMINE 0.025-0.3 % OP SOLN
1.0000 [drp] | Freq: Two times a day (BID) | OPHTHALMIC | Status: DC | PRN
  Filled 2018-02-24: qty 15

## 2018-02-24 MED ORDER — SIMETHICONE 80 MG PO CHEW: 160.0000 mg | CHEWABLE_TABLET | Freq: Every day | ORAL | Status: DC | PRN

## 2018-02-24 MED ORDER — METFORMIN HCL ER 500 MG PO TB24
500.0000 mg | ORAL_TABLET | Freq: Two times a day (BID) | ORAL | Status: DC
  Administered 2018-02-24 – 2018-02-27 (×6): 500 mg via ORAL
  Filled 2018-02-24 (×6): qty 1

## 2018-02-24 MED ORDER — MORPHINE SULFATE (PF) 2 MG/ML IV SOLN
0.5000 mg | INTRAVENOUS | Status: DC | PRN
  Administered 2018-02-24 (×2): 1 mg via INTRAVENOUS
  Filled 2018-02-24 (×2): qty 1

## 2018-02-24 MED ORDER — ACETAMINOPHEN 500 MG PO TABS: 500.0000 mg | ORAL_TABLET | Freq: Two times a day (BID) | ORAL | Status: DC | PRN

## 2018-02-24 MED ORDER — MEPERIDINE HCL 50 MG/ML IJ SOLN: 6.2500 mg | INTRAMUSCULAR | Status: DC | PRN

## 2018-02-24 MED ORDER — CHLORHEXIDINE GLUCONATE 4 % EX LIQD: 60.0000 mL | Freq: Once | CUTANEOUS | Status: DC

## 2018-02-24 MED ORDER — INSULIN ASPART 100 UNIT/ML ~~LOC~~ SOLN
0.0000 [IU] | Freq: Three times a day (TID) | SUBCUTANEOUS | Status: DC
  Administered 2018-02-25: 2 [IU] via SUBCUTANEOUS
  Administered 2018-02-25 – 2018-02-26 (×2): 3 [IU] via SUBCUTANEOUS
  Administered 2018-02-26 – 2018-02-27 (×2): 2 [IU] via SUBCUTANEOUS

## 2018-02-24 MED ORDER — METHOCARBAMOL 500 MG PO TABS: 500.0000 mg | ORAL_TABLET | Freq: Three times a day (TID) | ORAL | 1 refills | Status: DC | PRN

## 2018-02-24 MED ORDER — FENTANYL CITRATE (PF) 100 MCG/2ML IJ SOLN
50.0000 ug | Freq: Once | INTRAMUSCULAR | Status: AC
  Administered 2018-02-24: 50 ug via INTRAVENOUS

## 2018-02-24 MED ORDER — DEXAMETHASONE SODIUM PHOSPHATE 10 MG/ML IJ SOLN
INTRAMUSCULAR | Status: AC
  Filled 2018-02-24: qty 1

## 2018-02-24 MED ORDER — AMLODIPINE BESYLATE 10 MG PO TABS
10.0000 mg | ORAL_TABLET | Freq: Every day | ORAL | Status: DC
  Administered 2018-02-25 – 2018-02-26 (×2): 10 mg via ORAL
  Filled 2018-02-24 (×3): qty 1

## 2018-02-24 MED ORDER — FENTANYL CITRATE (PF) 100 MCG/2ML IJ SOLN
INTRAMUSCULAR | Status: DC | PRN
  Administered 2018-02-24 (×3): 25 ug via INTRAVENOUS
  Administered 2018-02-24: 50 ug via INTRAVENOUS
  Administered 2018-02-24: 25 ug via INTRAVENOUS

## 2018-02-24 MED ORDER — LIDOCAINE 2% (20 MG/ML) 5 ML SYRINGE
INTRAMUSCULAR | Status: DC | PRN
  Administered 2018-02-24: 80 mg via INTRAVENOUS

## 2018-02-24 MED ORDER — PROMETHAZINE HCL 25 MG/ML IJ SOLN
6.2500 mg | INTRAMUSCULAR | Status: AC | PRN
  Administered 2018-02-24 (×2): 12.5 mg via INTRAVENOUS

## 2018-02-24 MED ORDER — OXYCODONE HCL 5 MG/5ML PO SOLN: 5.0000 mg | Freq: Once | ORAL | Status: DC | PRN

## 2018-02-24 MED ORDER — MIDAZOLAM HCL 2 MG/2ML IJ SOLN
INTRAMUSCULAR | Status: AC
  Filled 2018-02-24: qty 2

## 2018-02-24 MED ORDER — ALBUTEROL SULFATE (2.5 MG/3ML) 0.083% IN NEBU: 3.0000 mL | INHALATION_SOLUTION | Freq: Four times a day (QID) | RESPIRATORY_TRACT | Status: DC | PRN

## 2018-02-24 MED ORDER — METOCLOPRAMIDE HCL 5 MG PO TABS: 5.0000 mg | ORAL_TABLET | Freq: Three times a day (TID) | ORAL | Status: DC | PRN

## 2018-02-24 MED ORDER — ROPIVACAINE HCL 5 MG/ML IJ SOLN
INTRAMUSCULAR | Status: DC | PRN
  Administered 2018-02-24: 20 mL via PERINEURAL

## 2018-02-24 MED ORDER — NAPHAZOLINE-PHENIRAMINE 0.027-0.315 % OP SOLN: Freq: Two times a day (BID) | OPHTHALMIC | Status: DC | PRN

## 2018-02-24 MED ORDER — TRANEXAMIC ACID 1000 MG/10ML IV SOLN
1000.0000 mg | Freq: Once | INTRAVENOUS | Status: AC
  Administered 2018-02-24: 1000 mg via INTRAVENOUS
  Filled 2018-02-24: qty 10

## 2018-02-24 MED ORDER — FENTANYL CITRATE (PF) 100 MCG/2ML IJ SOLN
INTRAMUSCULAR | Status: AC
  Filled 2018-02-24: qty 2

## 2018-02-24 MED ORDER — CLINDAMYCIN PHOSPHATE 900 MG/50ML IV SOLN
900.0000 mg | INTRAVENOUS | Status: AC
  Administered 2018-02-24: 900 mg via INTRAVENOUS
  Filled 2018-02-24: qty 50

## 2018-02-24 MED ORDER — HYDROMORPHONE HCL 1 MG/ML IJ SOLN
INTRAMUSCULAR | Status: AC
  Administered 2018-02-24: 0.25 mg via INTRAVENOUS
  Filled 2018-02-24: qty 1

## 2018-02-24 MED ORDER — LISINOPRIL 10 MG PO TABS
10.0000 mg | ORAL_TABLET | Freq: Every morning | ORAL | Status: DC
  Administered 2018-02-25 – 2018-02-27 (×3): 10 mg via ORAL
  Filled 2018-02-24 (×3): qty 1

## 2018-02-24 MED ORDER — HYDROCODONE-ACETAMINOPHEN 5-325 MG PO TABS: 1.0000 | ORAL_TABLET | Freq: Four times a day (QID) | ORAL | 0 refills | Status: DC | PRN

## 2018-02-24 MED ORDER — BISACODYL 10 MG RE SUPP: 10.0000 mg | Freq: Every day | RECTAL | Status: DC | PRN

## 2018-02-24 MED ORDER — LOPERAMIDE HCL 1 MG/7.5ML PO SUSP
1.0000 mg | ORAL | Status: DC | PRN
  Administered 2018-02-24: 1 mg via ORAL
  Filled 2018-02-24 (×3): qty 7.5

## 2018-02-24 MED ORDER — EVOLOCUMAB WITH INFUSOR 420 MG/3.5ML ~~LOC~~ SOCT: 420.0000 mg | SUBCUTANEOUS | Status: DC

## 2018-02-24 MED ORDER — ONDANSETRON HCL 4 MG/2ML IJ SOLN
INTRAMUSCULAR | Status: DC | PRN
  Administered 2018-02-24: 4 mg via INTRAVENOUS

## 2018-02-24 MED ORDER — METHOCARBAMOL 1000 MG/10ML IJ SOLN
500.0000 mg | Freq: Four times a day (QID) | INTRAVENOUS | Status: DC | PRN
  Filled 2018-02-24: qty 5

## 2018-02-24 MED ORDER — INSULIN ASPART 100 UNIT/ML ~~LOC~~ SOLN
4.0000 [IU] | Freq: Three times a day (TID) | SUBCUTANEOUS | Status: DC
  Administered 2018-02-25 – 2018-02-27 (×8): 4 [IU] via SUBCUTANEOUS

## 2018-02-24 MED ORDER — LATANOPROST 0.005 % OP SOLN
1.0000 [drp] | Freq: Every day | OPHTHALMIC | Status: DC
  Administered 2018-02-24 – 2018-02-26 (×3): 1 [drp] via OPHTHALMIC
  Filled 2018-02-24: qty 2.5

## 2018-02-24 MED ORDER — GABAPENTIN 100 MG PO CAPS
100.0000 mg | ORAL_CAPSULE | Freq: Every day | ORAL | Status: DC
  Administered 2018-02-24 – 2018-02-26 (×3): 100 mg via ORAL
  Filled 2018-02-24 (×3): qty 1

## 2018-02-24 MED ORDER — FENTANYL CITRATE (PF) 250 MCG/5ML IJ SOLN
INTRAMUSCULAR | Status: AC
  Filled 2018-02-24: qty 5

## 2018-02-24 MED ORDER — LACTATED RINGERS IV SOLN
INTRAVENOUS | Status: DC
  Administered 2018-02-24: 10 mL/h via INTRAVENOUS
  Administered 2018-02-24: 15:00:00 via INTRAVENOUS

## 2018-02-24 MED ORDER — MIDAZOLAM HCL 2 MG/2ML IJ SOLN
1.0000 mg | Freq: Once | INTRAMUSCULAR | Status: AC
  Administered 2018-02-24: 1 mg via INTRAVENOUS

## 2018-02-24 MED ORDER — PROMETHAZINE HCL 25 MG/ML IJ SOLN
INTRAMUSCULAR | Status: AC
  Administered 2018-02-24: 12.5 mg via INTRAVENOUS
  Filled 2018-02-24: qty 1

## 2018-02-24 MED ORDER — 0.9 % SODIUM CHLORIDE (POUR BTL) OPTIME
TOPICAL | Status: DC | PRN
  Administered 2018-02-24: 1000 mL

## 2018-02-24 MED ORDER — DOCUSATE SODIUM 100 MG PO CAPS
100.0000 mg | ORAL_CAPSULE | Freq: Two times a day (BID) | ORAL | Status: DC
  Administered 2018-02-24 – 2018-02-27 (×7): 100 mg via ORAL
  Filled 2018-02-24 (×7): qty 1

## 2018-02-24 MED ORDER — ONDANSETRON HCL 4 MG/2ML IJ SOLN: 4.0000 mg | Freq: Four times a day (QID) | INTRAMUSCULAR | Status: DC | PRN

## 2018-02-24 MED ORDER — VITAMIN D 1000 UNITS PO TABS
2000.0000 [IU] | ORAL_TABLET | Freq: Every day | ORAL | Status: DC
  Administered 2018-02-24 – 2018-02-27 (×4): 2000 [IU] via ORAL
  Filled 2018-02-24 (×6): qty 2

## 2018-02-24 MED ORDER — ACETAMINOPHEN 325 MG PO TABS: 325.0000 mg | ORAL_TABLET | Freq: Four times a day (QID) | ORAL | Status: DC | PRN

## 2018-02-24 MED ORDER — HYDROCODONE-ACETAMINOPHEN 5-325 MG PO TABS
1.0000 | ORAL_TABLET | ORAL | Status: DC | PRN
  Administered 2018-02-26 – 2018-02-27 (×3): 2 via ORAL
  Filled 2018-02-24 (×3): qty 2

## 2018-02-24 MED ORDER — INSULIN ASPART 100 UNIT/ML ~~LOC~~ SOLN
0.0000 [IU] | Freq: Every day | SUBCUTANEOUS | Status: DC
  Administered 2018-02-24: 3 [IU] via SUBCUTANEOUS

## 2018-02-24 MED ORDER — HYDROCODONE-ACETAMINOPHEN 7.5-325 MG PO TABS
1.0000 | ORAL_TABLET | ORAL | Status: DC | PRN
  Administered 2018-02-24 (×2): 1 via ORAL
  Administered 2018-02-25 (×2): 2 via ORAL
  Administered 2018-02-25: 1 via ORAL
  Administered 2018-02-25: 2 via ORAL
  Administered 2018-02-25: 1 via ORAL
  Administered 2018-02-26 – 2018-02-27 (×5): 2 via ORAL
  Filled 2018-02-24 (×3): qty 2
  Filled 2018-02-24 (×4): qty 1
  Filled 2018-02-24 (×4): qty 2
  Filled 2018-02-24 (×2): qty 1

## 2018-02-24 MED ORDER — LIDOCAINE 5 % EX PTCH
1.0000 | MEDICATED_PATCH | Freq: Every day | CUTANEOUS | Status: DC | PRN
  Filled 2018-02-24: qty 1

## 2018-02-24 MED ORDER — CLOPIDOGREL BISULFATE 75 MG PO TABS
75.0000 mg | ORAL_TABLET | Freq: Every day | ORAL | Status: DC
  Administered 2018-02-25 – 2018-02-26 (×2): 75 mg via ORAL
  Filled 2018-02-24 (×2): qty 1

## 2018-02-24 MED ORDER — METHOCARBAMOL 500 MG PO TABS
500.0000 mg | ORAL_TABLET | Freq: Four times a day (QID) | ORAL | Status: DC | PRN
  Administered 2018-02-26 – 2018-02-27 (×3): 500 mg via ORAL
  Filled 2018-02-24 (×3): qty 1

## 2018-02-24 MED ORDER — OXYCODONE HCL 5 MG PO TABS: 5.0000 mg | ORAL_TABLET | Freq: Once | ORAL | Status: DC | PRN

## 2018-02-24 MED ORDER — PANTOPRAZOLE SODIUM 40 MG PO TBEC
40.0000 mg | DELAYED_RELEASE_TABLET | Freq: Every day | ORAL | Status: DC
  Administered 2018-02-24 – 2018-02-27 (×4): 40 mg via ORAL
  Filled 2018-02-24 (×4): qty 1

## 2018-02-24 MED ORDER — SODIUM CHLORIDE 0.9 % IR SOLN
Status: DC | PRN
  Administered 2018-02-24: 3000 mL

## 2018-02-24 MED ORDER — POLYETHYLENE GLYCOL 3350 17 G PO PACK
17.0000 g | PACK | Freq: Every day | ORAL | Status: DC | PRN
  Administered 2018-02-26: 17 g via ORAL
  Filled 2018-02-24: qty 1

## 2018-02-24 MED ORDER — TRAMADOL HCL 50 MG PO TABS: 50.0000 mg | ORAL_TABLET | Freq: Four times a day (QID) | ORAL | Status: DC | PRN

## 2018-02-24 MED ORDER — PROPOFOL 10 MG/ML IV BOLUS
INTRAVENOUS | Status: AC
  Filled 2018-02-24: qty 20

## 2018-02-24 MED ORDER — PHENOL 1.4 % MT LIQD: 1.0000 | OROMUCOSAL | Status: DC | PRN

## 2018-02-24 MED ORDER — ONDANSETRON HCL 4 MG PO TABS
4.0000 mg | ORAL_TABLET | Freq: Four times a day (QID) | ORAL | Status: DC | PRN
  Administered 2018-02-25: 4 mg via ORAL
  Filled 2018-02-24: qty 1

## 2018-02-24 MED ORDER — HYDROMORPHONE HCL 1 MG/ML IJ SOLN
0.2500 mg | INTRAMUSCULAR | Status: DC | PRN
  Administered 2018-02-24: 0.25 mg via INTRAVENOUS
  Administered 2018-02-24: 0.5 mg via INTRAVENOUS
  Administered 2018-02-24: 0.25 mg via INTRAVENOUS

## 2018-02-24 MED ORDER — SODIUM CHLORIDE 0.9 % IV SOLN
INTRAVENOUS | Status: DC | PRN
  Administered 2018-02-24: 25 ug/min via INTRAVENOUS

## 2018-02-24 MED ORDER — DEXAMETHASONE SODIUM PHOSPHATE 10 MG/ML IJ SOLN
INTRAMUSCULAR | Status: DC | PRN
  Administered 2018-02-24: 5 mg via INTRAVENOUS

## 2018-02-24 MED ORDER — ONDANSETRON HCL 4 MG/2ML IJ SOLN
INTRAMUSCULAR | Status: AC
  Filled 2018-02-24: qty 2

## 2018-02-24 MED ORDER — LIDOCAINE 2% (20 MG/ML) 5 ML SYRINGE
INTRAMUSCULAR | Status: AC
  Filled 2018-02-24: qty 5

## 2018-02-24 SURGICAL SUPPLY — 66 items
AUGMENT PFC SIG RP SZ2.5 12.5M (Knees) ×1 IMPLANT
BANDAGE ACE 6X5 VEL STRL LF (GAUZE/BANDAGES/DRESSINGS) ×3 IMPLANT
BANDAGE ESMARK 6X9 LF (GAUZE/BANDAGES/DRESSINGS) ×1 IMPLANT
BLADE SAG 18X100X1.27 (BLADE) ×3 IMPLANT
BLADE SAW SGTL 13X75X1.27 (BLADE) ×3 IMPLANT
BNDG ELASTIC 6X10 VLCR STRL LF (GAUZE/BANDAGES/DRESSINGS) ×3 IMPLANT
BNDG ESMARK 6X9 LF (GAUZE/BANDAGES/DRESSINGS) ×3
BNDG GAUZE ELAST 4 BULKY (GAUZE/BANDAGES/DRESSINGS) ×6 IMPLANT
BOWL SMART MIX CTS (DISPOSABLE) ×3 IMPLANT
CEMENT HV SMART SET (Cement) ×6 IMPLANT
CEMENT TIBIA MBT SIZE 2 (Knees) ×1 IMPLANT
CLOSURE STERI-STRIP 1/2X4 (GAUZE/BANDAGES/DRESSINGS) ×1
CLOSURE WOUND 1/2 X4 (GAUZE/BANDAGES/DRESSINGS) ×2
CLSR STERI-STRIP ANTIMIC 1/2X4 (GAUZE/BANDAGES/DRESSINGS) ×2 IMPLANT
COVER SURGICAL LIGHT HANDLE (MISCELLANEOUS) ×3 IMPLANT
CUFF TOURNIQUET SINGLE 34IN LL (TOURNIQUET CUFF) IMPLANT
CUFF TOURNIQUET SINGLE 44IN (TOURNIQUET CUFF) IMPLANT
DRAPE EXTREMITY T 121X128X90 (DRAPE) ×3 IMPLANT
DRAPE HALF SHEET 40X57 (DRAPES) ×3 IMPLANT
DRAPE U-SHAPE 47X51 STRL (DRAPES) ×3 IMPLANT
DRSG ADAPTIC 3X8 NADH LF (GAUZE/BANDAGES/DRESSINGS) ×3 IMPLANT
DRSG PAD ABDOMINAL 8X10 ST (GAUZE/BANDAGES/DRESSINGS) ×6 IMPLANT
DURAPREP 26ML APPLICATOR (WOUND CARE) ×3 IMPLANT
ELECT CAUTERY BLADE 6.4 (BLADE) ×3 IMPLANT
ELECT REM PT RETURN 9FT ADLT (ELECTROSURGICAL) ×3
ELECTRODE REM PT RTRN 9FT ADLT (ELECTROSURGICAL) ×1 IMPLANT
FEMUR SIGMA PS SZ 2.5 R (Femur) ×3 IMPLANT
GAUZE SPONGE 4X4 12PLY STRL (GAUZE/BANDAGES/DRESSINGS) ×3 IMPLANT
GLOVE BIOGEL PI ORTHO PRO 7.5 (GLOVE) ×2
GLOVE BIOGEL PI ORTHO PRO SZ8 (GLOVE) ×2
GLOVE ORTHO TXT STRL SZ7.5 (GLOVE) ×3 IMPLANT
GLOVE PI ORTHO PRO STRL 7.5 (GLOVE) ×1 IMPLANT
GLOVE PI ORTHO PRO STRL SZ8 (GLOVE) ×1 IMPLANT
GLOVE SURG ORTHO 8.5 STRL (GLOVE) ×3 IMPLANT
GOWN STRL REUS W/ TWL XL LVL3 (GOWN DISPOSABLE) ×3 IMPLANT
GOWN STRL REUS W/TWL XL LVL3 (GOWN DISPOSABLE) ×6
HANDPIECE INTERPULSE COAX TIP (DISPOSABLE) ×2
IMMOBILIZER KNEE 22 (SOFTGOODS) ×3 IMPLANT
IMMOBILIZER KNEE 22 UNIV (SOFTGOODS) IMPLANT
KIT BASIN OR (CUSTOM PROCEDURE TRAY) ×3 IMPLANT
KIT MANIFOLD (MISCELLANEOUS) ×3 IMPLANT
KIT TURNOVER KIT B (KITS) ×3 IMPLANT
MANIFOLD NEPTUNE II (INSTRUMENTS) ×3 IMPLANT
NS IRRIG 1000ML POUR BTL (IV SOLUTION) ×3 IMPLANT
PACK TOTAL JOINT (CUSTOM PROCEDURE TRAY) ×3 IMPLANT
PAD ABD 8X10 STRL (GAUZE/BANDAGES/DRESSINGS) ×3 IMPLANT
PAD ARMBOARD 7.5X6 YLW CONV (MISCELLANEOUS) ×6 IMPLANT
PATELLA DOME PFC 35MM (Knees) ×3 IMPLANT
PFC SIGMA RP STB SZ 2.5 12.5M (Knees) ×3 IMPLANT
PIN STEINMAN FIXATION KNEE (PIN) ×3 IMPLANT
SET HNDPC FAN SPRY TIP SCT (DISPOSABLE) ×1 IMPLANT
STRIP CLOSURE SKIN 1/2X4 (GAUZE/BANDAGES/DRESSINGS) ×4 IMPLANT
SUCTION FRAZIER HANDLE 10FR (MISCELLANEOUS) ×2
SUCTION TUBE FRAZIER 10FR DISP (MISCELLANEOUS) ×1 IMPLANT
SUT MNCRL AB 3-0 PS2 18 (SUTURE) ×3 IMPLANT
SUT VIC AB 0 CT1 27 (SUTURE) ×4
SUT VIC AB 0 CT1 27XBRD ANBCTR (SUTURE) ×2 IMPLANT
SUT VIC AB 1 CT1 27 (SUTURE) ×6
SUT VIC AB 1 CT1 27XBRD ANBCTR (SUTURE) ×3 IMPLANT
SUT VIC AB 2-0 CT1 27 (SUTURE) ×4
SUT VIC AB 2-0 CT1 TAPERPNT 27 (SUTURE) ×2 IMPLANT
TIBIA MBT CEMENT SIZE 2 (Knees) ×3 IMPLANT
TOWEL OR 17X24 6PK STRL BLUE (TOWEL DISPOSABLE) ×3 IMPLANT
TOWEL OR 17X26 10 PK STRL BLUE (TOWEL DISPOSABLE) ×3 IMPLANT
TRAY CATH 16FR W/PLASTIC CATH (SET/KITS/TRAYS/PACK) IMPLANT
TRAY FOLEY MTR SLVR 16FR STAT (SET/KITS/TRAYS/PACK) IMPLANT

## 2018-02-24 NOTE — Op Note (Signed)
NAME: Kathleen Oliver, WARR MEDICAL RECORD [id number] ACCOUNT [id number] DATE OF BIRTH:[birth year] FACILITY: [facility] LOCATION: [facility] PHYSICIAN:STEVEN Orlena Sheldon, MD  OPERATIVE REPORT  DATE OF PROCEDURE:  02/24/2018  PREOPERATIVE DIAGNOSIS:  Right knee end-stage arthritis.  POSTOPERATIVE DIAGNOSIS:  Right knee end-stage arthritis.  PROCEDURE PERFORMED:  Right total knee arthroplasty using DePuy Sigma rotating platform prosthesis.  ATTENDING SURGEON:  Esmond Plants, MD  ASSISTANT:  Darol Destine, [place], who was scrubbed during the entire procedure and necessary for satisfactory completion of surgery.  ANESTHESIA:  General anesthesia was used plus adductor canal block.  ESTIMATED BLOOD LOSS:  Minimal.  FLUID REPLACEMENT:  1500 mL crystalloid.  INSTRUMENT COUNTS:  Correct.  ANTIBIOTICS:  Perioperative antibiotics were given.  INDICATIONS:  The patient is a 72 year old female with worsening right knee pain secondary to end-stage arthritis.  The patient has failed all measures of conservative management and desires operative treatment to restore function and eliminate pain to  the knee.  Informed consent obtained.  DESCRIPTION OF PROCEDURE:  After adequate level of anesthesia was achieved, the patient was positioned in the supine position, the right leg correctly identified.  A nonsterile tourniquet placed on the proximal thigh.  Right leg sterilely prepped and  draped in the usual manner.  Timeout was called verifying correct patient, correct site.  We then elevated the leg and exsanguinated with an Esmarch bandage and elevated the tourniquet to 300 mmHg.  We placed the knee in flexion and performed a midline  incision over the right knee.  Dissection down through subcutaneous tissues using a 10 blade scalpel identified the peripatellar tissues and performed a median parapatellar arthrotomy, dividing the lateral patellofemoral ligaments and everting the  patella.  We then had good  exposure of the distal femur.  There was complete cartilage loss in the medial and lateral compartment, more laterally than medially.  We entered the distal femur with a step cut drill.  We placed an intramedullary resection  guide, resecting 10 mm offset on 5 degrees right.  We then sized the femur to a size 2.5 anterior down and performed anterior, posterior and chamfer cuts with a 4-in-1 block.  We then removed ACL, PCL, and meniscal tissues.  Subluxed the tibia anteriorly  and performed our tibial cut perpendicular to the long axis of the tibia resecting 4 mm off the affected side.  We did minimal posterior slope for this posterior cruciate substituting prosthesis.  Once we had our resections done, we used a laminar  spreader, removed excess posterior bone off the posterior femoral condyle.  We then checked our spacing gaps, which were symmetric at 10 mm, both in flexion and extension.  We then completed our tibial preparation with the modular drill and keel punch  and used the box cut guide to remove the femur for the box for the posterior cruciate substituting prosthesis.  With a 2 tibia and 2.5 femur right and once we placed our 10 mm poly in place, we placed the knee in extension.  We had full extension with  nice flexion and extension stability.  We then resurfaced the patella going from a 23 mm thickness down to 16 mm thickness and cut that using an oscillating saw.  We drilled our lug holes for a 35 patellar button and placed our 35 patellar button in  place.  We then ranged the knee and had wonderful patellar tracking with no-touch technique.  We removed all trial components, pulse irrigated the knee and then dried well in preparation  for cementing.  We used DePuy high viscosity cement vacuum mixed on  the back table and then placed the components into place and cemented them into position.  We placed the knee in extension with a 10 mm poly and held that until the cement was dry.  We used a  patellar clamp on the patella.  We removed excess cement with  quarter inch curved osteotome once all the cement was hardened on the table.  We then trialed again and felt like we could get a 12.5 real insert in and so we selected the real 2.5, 12.5 insert and placed that on the tibia after thorough irrigation and  then reduced the knee.  We had good stability both in flexion and extension and we were able to get full extension.  Patellar tracking looked great.  We irrigated thoroughly and then closed the parapatellar arthrotomy with #1 Vicryl suture followed by  2-0 Vicryl for subcutaneous closure and 4-0 Monocryl for skin.  Steri-Strips applied followed by sterile dressing.  The patient tolerated surgery well.  TN/NUANCE  D:02/24/2018 T:02/24/2018 JOB:[id number]

## 2018-02-24 NOTE — Anesthesia Procedure Notes (Signed)
Anesthesia Regional Block: Adductor canal block   Pre-Anesthetic Checklist: ,, timeout performed, Correct Patient, Correct Site, Correct Laterality, Correct Procedure, Correct Position, site marked, Risks and benefits discussed,  Surgical consent,  Pre-op evaluation,  At surgeon's request and post-op pain management  Laterality: Right  Prep: chloraprep       Needles:  Injection technique: Single-shot  Needle Type: Stimiplex     Needle Length: 9cm  Needle Gauge: 21     Additional Needles:   Procedures:,,,, ultrasound used (permanent image in chart),,,,  Narrative:  Start time: 02/24/2018 9:28 AM End time: 02/24/2018 9:33 AM Injection made incrementally with aspirations every 5 mL.  Performed by: Personally  Anesthesiologist: Lynda Rainwater, MD

## 2018-02-24 NOTE — Progress Notes (Signed)
Orthopedic Tech Progress Note Patient Details:  Kathleen Oliver June 13, 1946 [id number]  CPM Right Knee CPM Right Knee: On Right Knee Flexion (Degrees): 90 Right Knee Extension (Degrees): 0 Additional Comments: foot roll  Post Interventions Patient Tolerated: Well Instructions Provided: Care of device  [place] Pink 02/24/2018, 2:15 PM

## 2018-02-24 NOTE — Discharge Instructions (Signed)
Ice to the knee as much as you can.  Keep the incision clean and dry for one week, then ok to get it wet in the shower.  Ok to put full weight on the right leg.  Do exercises every hour for 5-10 minutes.  Sleep with the knee brace on at night to keep knee straight.   Follow up with Dr Veverly Fells in the office in two weeks, call [phone] for appt.

## 2018-02-24 NOTE — Progress Notes (Signed)
1121 time taken for handoff

## 2018-02-24 NOTE — Interval H&P Note (Signed)
History and Physical Interval Note:  02/24/2018 10:26 AM  Kathleen Oliver  has presented today for surgery, with the diagnosis of Right knee osteoarthritis, end stage  The various methods of treatment have been discussed with the patient and family. After consideration of risks, benefits and other options for treatment, the patient has consented to  Procedure(s): RIGHT TOTAL KNEE ARTHROPLASTY (Right) as a surgical intervention .  The patient's history has been reviewed, patient examined, no change in status, stable for surgery.  I have reviewed the patient's chart and labs.  Questions were answered to the patient's satisfaction.     Xander Jutras,STEVEN R

## 2018-02-24 NOTE — Brief Op Note (Signed)
02/24/2018  12:52 PM  PATIENT:  Kathleen Oliver  72 y.o. female  PRE-OPERATIVE DIAGNOSIS:  Right knee osteoarthritis, end stage  POST-OPERATIVE DIAGNOSIS:  Right Knee Osteoarthritis, End Stage  PROCEDURE:  Procedure(s): RIGHT TOTAL KNEE ARTHROPLASTY (Right) DePuy Sigma RP  SURGEON:  Surgeon(s) and Role:    Netta Cedars, MD - Primary  PHYSICIAN ASSISTANT:   ASSISTANTS: [place] Bruns, PA-C   ANESTHESIA:   regional and general  EBL:  50 mL   BLOOD ADMINISTERED:none  DRAINS: none   LOCAL MEDICATIONS USED:  NONE  SPECIMEN:  No Specimen  DISPOSITION OF SPECIMEN:  N/A  COUNTS:  YES  TOURNIQUET:   Total Tourniquet Time Documented: Thigh (Right) - 95 minutes Total: Thigh (Right) - 95 minutes   DICTATION: .Other Dictation: Dictation Number [id number]  PLAN OF CARE: Admit to inpatient   PATIENT DISPOSITION:  PACU - hemodynamically stable.   Delay start of Pharmacological VTE agent (>24hrs) due to surgical blood loss or risk of bleeding: no

## 2018-02-24 NOTE — Transfer of Care (Signed)
Immediate Anesthesia Transfer of Care Note  Patient: Kathleen Oliver  Procedure(s) Performed: RIGHT TOTAL KNEE ARTHROPLASTY (Right Knee)  Patient Location: PACU  Anesthesia Type:GA combined with regional for post-op pain  Level of Consciousness: awake, alert  and oriented  Airway & Oxygen Therapy: Patient Spontanous Breathing and Patient connected to nasal cannula oxygen  Post-op Assessment: Report given to RN and Post -op Vital signs reviewed and stable  Post vital signs: Reviewed and stable  Last Vitals:  Vitals Value Taken Time  BP 140/65 02/24/2018 12:49 PM  Temp    Pulse 78 02/24/2018 12:51 PM  Resp 15 02/24/2018 12:51 PM  SpO2 86 % 02/24/2018 12:51 PM  Vitals shown include unvalidated device data.  Last Pain:  Vitals:   02/24/18 0837  TempSrc: Oral  PainSc: 4       Patients Stated Pain Goal: 3 (98/26/41 5830)  Complications: No apparent anesthesia complications

## 2018-02-24 NOTE — Anesthesia Preprocedure Evaluation (Addendum)
Anesthesia Evaluation  Patient identified by MRN, date of birth, ID band Patient awake    Reviewed: Allergy & Precautions, NPO status , Patient's Chart, lab work & pertinent test results  Airway Mallampati: II  TM Distance: >3 FB Neck ROM: Full    Dental no notable dental hx. (+) Teeth Intact, Dental Advisory Given, Chipped,    Pulmonary asthma , former smoker,    Pulmonary exam normal breath sounds clear to auscultation       Cardiovascular hypertension, Pt. on medications + CAD  Normal cardiovascular exam Rhythm:Regular Rate:Normal     Neuro/Psych Seizures -,  Anxiety CVA, No Residual Symptoms negative psych ROS   GI/Hepatic negative GI ROS, Neg liver ROS, GERD  ,  Endo/Other  diabetes, Type 2, Oral Hypoglycemic Agents  Renal/GU negative Renal ROS  negative genitourinary   Musculoskeletal negative musculoskeletal ROS (+) Arthritis , Osteoarthritis,    Abdominal (+) + obese,   Peds negative pediatric ROS (+)  Hematology negative hematology ROS (+)   Anesthesia Other Findings   Reproductive/Obstetrics negative OB ROS                            Anesthesia Physical  Anesthesia Plan  ASA: III  Anesthesia Plan: General   Post-op Pain Management: GA combined w/ Regional for post-op pain   Induction: Intravenous  PONV Risk Score and Plan: 3 and Ondansetron, Dexamethasone and Midazolam  Airway Management Planned: LMA  Additional Equipment:   Intra-op Plan:   Post-operative Plan: Extubation in OR  Informed Consent: I have reviewed the patients History and Physical, chart, labs and discussed the procedure including the risks, benefits and alternatives for the proposed anesthesia with the patient or authorized representative who has indicated his/her understanding and acceptance.   Dental advisory given  Plan Discussed with: CRNA  Anesthesia Plan Comments:          Anesthesia Quick Evaluation

## 2018-02-24 NOTE — Progress Notes (Signed)
Patient received from PACU in NAD. VS stable, daughter at bedside.

## 2018-02-24 NOTE — Anesthesia Procedure Notes (Signed)
Procedure Name: LMA Insertion Date/Time: 02/24/2018 10:41 AM Performed by: Kyung Rudd, CRNA Pre-anesthesia Checklist: Patient identified, Emergency Drugs available, Suction available, Patient being monitored and Timeout performed Patient Re-evaluated:Patient Re-evaluated prior to induction Oxygen Delivery Method: Circle system utilized Preoxygenation: Pre-oxygenation with 100% oxygen Induction Type: IV induction LMA: LMA inserted LMA Size: 4.0 Number of attempts: 1 Placement Confirmation: positive ETCO2 and breath sounds checked- equal and bilateral Tube secured with: Tape Dental Injury: Teeth and Oropharynx as per pre-operative assessment

## 2018-02-25 LAB — GLUCOSE, CAPILLARY
GLUCOSE-CAPILLARY: 101 mg/dL — AB (ref 70–99)
Glucose-Capillary: 131 mg/dL — ABNORMAL HIGH (ref 70–99)
Glucose-Capillary: 199 mg/dL — ABNORMAL HIGH (ref 70–99)
Glucose-Capillary: 106 mg/dL — ABNORMAL HIGH (ref 70–99)

## 2018-02-25 LAB — BASIC METABOLIC PANEL
Anion gap: 7 (ref 5–15)
BUN: 13 mg/dL (ref 8–23)
CALCIUM: 8.9 mg/dL (ref 8.9–10.3)
CO2: 27 mmol/L (ref 22–32)
CREATININE: 0.71 mg/dL (ref 0.44–1.00)
Chloride: 105 mmol/L (ref 98–111)
GFR calc non Af Amer: >60 mL/min (ref >60)
Glucose, Bld: 157 mg/dL — ABNORMAL HIGH (ref 70–99)
Potassium: 4.6 mmol/L (ref 3.5–5.1)
Sodium: 139 mmol/L (ref 135–145)

## 2018-02-25 LAB — CBC
HEMATOCRIT: 34.2 % — AB (ref 36.0–46.0)
Hemoglobin: 10.9 g/dL — ABNORMAL LOW (ref 12.0–15.0)
MCH: 29 pg (ref 26.0–34.0)
MCHC: 31.9 g/dL (ref 30.0–36.0)
MCV: 91 fL (ref 78.0–100.0)
PLATELETS: 252 10*3/uL (ref 150–400)
RBC: 3.76 MIL/uL — ABNORMAL LOW (ref 3.87–5.11)
RDW: 13.8 % (ref 11.5–15.5)
WBC: 10.4 10*3/uL (ref 4.0–10.5)

## 2018-02-25 MED ORDER — ENSURE SURGERY PO LIQD
237.0000 mL | Freq: Two times a day (BID) | ORAL | Status: DC
  Administered 2018-02-25 – 2018-02-27 (×5): 237 mL via ORAL
  Filled 2018-02-25 (×5): qty 237

## 2018-02-25 NOTE — Evaluation (Signed)
Physical Therapy Evaluation Patient Details Name: Kathleen Oliver MRN: [id number] DOB: 1945-11-01 Today's Date: 02/25/2018   History of Present Illness  Pt is a 72 y.o. F with significant PMH of HTN, stroke, DM who is s/p right total knee arthroplasty.  Clinical Impression  Pt is s/p TKA resulting in the deficits listed below (see PT Problem List). Patient very limited in functional mobility compared to baseline. Presenting with decreased right knee range of motion, gross weakness, impaired balance, poor activity tolerance, and difficulty walking. Seated knee flexion: 76 degrees flexion. Ambulating in room 25 feet with walker and minimal assistance before requiring a seated rest break. Essentially utilizing RLE touchdown weightbearing pattern during gait secondary to pain. Pt will benefit from skilled PT to increase their independence and safety with mobility.     Follow Up Recommendations Follow surgeon's recommendation for DC plan and follow-up therapies;Supervision for mobility/OOB    Equipment Recommendations  Rolling walker with 5" wheels    Recommendations for Other Services OT consult     Precautions / Restrictions Precautions Precautions: Fall;Knee Precaution Booklet Issued: Yes (comment) Precaution Comments: reviewed knee immobilizer use Required Braces or Orthoses: Knee Immobilizer - Right Restrictions Weight Bearing Restrictions: No      Mobility  Bed Mobility Overal bed mobility: Needs Assistance Bed Mobility: Supine to Sit     Supine to sit: Min assist     General bed mobility comments: min assist for negotiating RLE  Transfers Overall transfer level: Needs assistance Equipment used: Rolling walker (2 wheeled) Transfers: Sit to/from Stand Sit to Stand: Min assist         General transfer comment: min assist to transition from sit <> stand with cues for RLE placement and hand placement for safety. Patient initially tending to bear weight through left  forearm on walker   Ambulation/Gait Ambulation/Gait assistance: Min assist Gait Distance (Feet): 25 Feet Assistive device: Rolling walker (2 wheeled) Gait Pattern/deviations: Step-to pattern;Trunk flexed;Decreased stance time - right;Decreased weight shift to right Gait velocity: decreased Gait velocity interpretation: <1.31 ft/sec, indicative of household ambulator General Gait Details: Patient bearing minimal weight through RLE. Max verbal cueing for sequencing with manual assistance provided for moving walker forward.   Stairs            Wheelchair Mobility    Modified Rankin (Stroke Patients Only)       Balance Overall balance assessment: Needs assistance Sitting-balance support: No upper extremity supported;Feet supported Sitting balance-Leahy Scale: Good     Standing balance support: Bilateral upper extremity supported Standing balance-Leahy Scale: Poor                               Pertinent Vitals/Pain Pain Assessment: Faces Faces Pain Scale: Hurts whole lot Pain Location: right knee at rest and with movement Pain Descriptors / Indicators: Burning Pain Intervention(s): Limited activity within patient's tolerance;Monitored during session    Home Living Family/patient expects to be discharged to:: Private residence Living Arrangements: Children(daughter ) Available Help at Discharge: Family;Available 24 hours/day(daughter is taking off a month of work) Type of Home: House Home Access: Stairs to enter   [employer] of Steps: 3 Home Layout: One level Home Equipment: Environmental consultant - 2 wheels;Cane - single point;Wheelchair - manual;Bedside commode      Prior Function Level of Independence: Independent with assistive device(s)         Comments: Independent with ADL's, uses cane for outdoor mobility, drives  Hand Dominance        Extremity/Trunk Assessment   Upper Extremity Assessment Upper Extremity Assessment: Overall WFL for  tasks assessed    Lower Extremity Assessment Lower Extremity Assessment: RLE deficits/detail RLE Deficits / Details: gross weakness; unable to perform heel slide or straight leg raise       Communication   Communication: No difficulties  Cognition Arousal/Alertness: Awake/alert Behavior During Therapy: WFL for tasks assessed/performed Overall Cognitive Status: Within Functional Limits for tasks assessed                                        General Comments      Exercises Total Joint Exercises Quad Sets: Right;10 reps;Supine Heel Slides: 5 reps;Right;Seated Goniometric ROM: Seated knee flexion: 76 degrees   Assessment/Plan    PT Assessment Patient needs continued PT services  PT Problem List Decreased strength;Decreased range of motion;Decreased activity tolerance;Decreased mobility;Decreased balance;Decreased coordination;Pain       PT Treatment Interventions DME instruction;Gait training;Stair training;Functional mobility training;Therapeutic activities;Therapeutic exercise;Balance training;Patient/family education    PT Goals (Current goals can be found in the Care Plan section)  Acute Rehab PT Goals Patient Stated Goal: "go home" PT Goal Formulation: With patient Time For Goal Achievement: 03/02/18 Potential to Achieve Goals: Good    Frequency 7X/week   Barriers to discharge        Co-evaluation               AM-PAC PT "6 Clicks" Daily Activity  Outcome Measure Difficulty turning over in bed (including adjusting bedclothes, sheets and blankets)?: A Little Difficulty moving from lying on back to sitting on the side of the bed? : Unable Difficulty sitting down on and standing up from a chair with arms (e.g., wheelchair, bedside commode, etc,.)?: Unable Help needed moving to and from a bed to chair (including a wheelchair)?: A Little Help needed walking in hospital room?: A Little Help needed climbing 3-5 steps with a railing? : A  Lot 6 Click Score: 13    End of Session Equipment Utilized During Treatment: Gait belt;Right knee immobilizer Activity Tolerance: Patient limited by pain Patient left: in chair;with call bell/phone within reach Nurse Communication: Mobility status PT Visit Diagnosis: Unsteadiness on feet (R26.81);Other abnormalities of gait and mobility (R26.89);Muscle weakness (generalized) (M62.81);Difficulty in walking, not elsewhere classified (R26.2);Pain Pain - Right/Left: Right Pain - part of body: Knee    Time: 5027-7412 PT Time Calculation (min) (ACUTE ONLY): 34 min   Charges:   PT Evaluation $PT Eval Moderate Complexity: 1 Mod PT Treatments $Gait Training: 8-22 mins        Ellamae Sia, PT, DPT Acute Rehabilitation Services  Pager: [phone]   Willy Eddy 02/25/2018, 12:42 PM

## 2018-02-25 NOTE — Progress Notes (Signed)
Orthopedic Tech Progress Note Patient Details:  Kathleen Oliver 1946/02/17 [id number]  Patient ID: Kathleen Oliver, female   DOB: 1945/09/21, 72 y.o.   MRN: [id number]   Kathleen Oliver, Kathleen Oliver 02/25/2018, 1:09 PM Placed pt's rle om cpm @1310 ; will increase as pt tolerates; RN notified

## 2018-02-25 NOTE — Progress Notes (Signed)
Patient ID: Kathleen Oliver, female   DOB: 20-Aug-1945, 72 y.o.   MRN: [id number] Subjective: 1 Day Post-Op Procedure(s) (LRB): RIGHT TOTAL KNEE ARTHROPLASTY (Right) Patient reports pain as moderate.    Patient has complaints of R knee pain. No N/V, CP, SOB, fever, chills. Uncomfortable in bed. No other c/o.  We will start therapy today. Plan is to go home after hospital stay.  Objective: Vital signs in last 24 hours: Temp:  [97.3 F (36.3 C)-98.8 F (37.1 C)] 98.2 F (36.8 C) (08/31 0638) Pulse Rate:  [68-81] 70 (08/31 0638) Resp:  [12-20] 16 (08/31 0638) BP: (117-140)/(54-86) 122/64 (08/31 0638) SpO2:  [95 %-100 %] 97 % (08/31 9798)  Intake/Output from previous day:  Intake/Output Summary (Last 24 hours) at 02/25/2018 0820 Last data filed at 02/25/2018 0640 Gross per 24 hour  Intake 2350 ml  Output 2925 ml  Net -575 ml    Intake/Output this shift: No intake/output data recorded.  Labs: Results for orders placed or performed during the hospital encounter of 02/24/18  Glucose, capillary  Result Value Ref Range   Glucose-Capillary 132 (H) 70 - 99 mg/dL  Glucose, capillary  Result Value Ref Range   Glucose-Capillary 181 (H) 70 - 99 mg/dL  CBC  Result Value Ref Range   WBC 10.4 4.0 - 10.5 K/uL   RBC 3.76 (L) 3.87 - 5.11 MIL/uL   Hemoglobin 10.9 (L) 12.0 - 15.0 g/dL   HCT 34.2 (L) 36.0 - 46.0 %   MCV 91.0 78.0 - 100.0 fL   MCH 29.0 26.0 - 34.0 pg   MCHC 31.9 30.0 - 36.0 g/dL   RDW 13.8 11.5 - 15.5 %   Platelets 252 150 - 400 K/uL  Basic metabolic panel  Result Value Ref Range   Sodium 139 135 - 145 mmol/L   Potassium 4.6 3.5 - 5.1 mmol/L   Chloride 105 98 - 111 mmol/L   CO2 27 22 - 32 mmol/L   Glucose, Bld 157 (H) 70 - 99 mg/dL   BUN 13 8 - 23 mg/dL   Creatinine, Ser 0.71 0.44 - 1.00 mg/dL   Calcium 8.9 8.9 - 10.3 mg/dL   GFR calc non Af Amer >60 >60 mL/min   GFR calc Af Amer >60 >60 mL/min   Anion gap 7 5 - 15  Glucose, capillary  Result Value Ref Range   Glucose-Capillary 287 (H) 70 - 99 mg/dL  Glucose, capillary  Result Value Ref Range   Glucose-Capillary 277 (H) 70 - 99 mg/dL  Glucose, capillary  Result Value Ref Range   Glucose-Capillary 131 (H) 70 - 99 mg/dL    Exam - Neurologically intact ABD soft Neurovascular intact Sensation intact distally Intact pulses distally Dorsiflexion/Plantar flexion intact Incision: dressing C/D/I and no drainage No cellulitis present Compartment soft no calf pain or sign of DVT Dressing - clean, dry, no drainage Motor function intact - moving foot and toes well on exam.   Assessment/Plan: 1 Day Post-Op Procedure(s) (LRB): RIGHT TOTAL KNEE ARTHROPLASTY (Right)  Advance diet Up with therapy D/C IV fluids Past Medical History:  Diagnosis Date  . Bronchial asthma   . Chronic bronchitis ([place])    "numerous years; not q yr"   . Complication of anesthesia 1999   "had 3 seizures & ended up in ICU days after I'd had anesthesia"   . Diverticulosis of colon   . Dysrhythmia    PVC's  . GERD (gastroesophageal reflux disease)   . History of hiatal hernia   .  HTN (hypertension)   . Hypercholesteremia   . Hypertension   . Obesity   . Osteoarthrosis   . Pneumonia 2000's X 1  . Recurrent UTI   . Seizures ([place]) 1999 X 3   "days after anesthesia"- no further issues  . Stroke ([place])    /MRI from 1999;; showed I'd had had several small ones"-no residual effects.  . Type II diabetes mellitus ([place])     DVT Prophylaxis - Plavix Weight-Bearing as tolerated to Right leg No vaccines Plan dressing change tomorrow and d/c home Monday  Anticipated LOS equal to or greater than 2 midnights due to - Age 50 and older with one or more of the following:  - Obesity  - Expected need for hospital services (PT, OT, Nursing) required for safe  discharge  - Anticipated need for postoperative skilled nursing care or inpatient rehab  - Active co-morbidities: Diabetes, Stroke and Cardiac Arrhythmia    BISSELL,  JACLYN M. 02/25/2018, 8:20 AM

## 2018-02-25 NOTE — Progress Notes (Addendum)
Initial Nutrition Assessment  DOCUMENTATION CODES:  Obesity unspecified  INTERVENTION:  Ensure Enlive po BID, each supplement provides 350 kcal and 20 grams of protein  Provided some nutritional guidance for discharge and recommendations for management of several of her different disease states.   NUTRITION DIAGNOSIS:  Unintentional weight loss related to altered GI function (?) vs healthier eating habits vs unknown etiology as evidenced by a loss of 20 lbs x 3 years  GOAL:  Patient will meet greater than or equal to 90% of their needs  MONITOR:  PO intake, Supplement acceptance, Weight trends, Labs  REASON FOR ASSESSMENT:  Malnutrition Screening Tool    ASSESSMENT:  72 y/o female PMHx HTN, CVA, DM2. GERD, Colectomy (x2 per pt) and end stage osteoarthritis in R knee. Presented for ELECTIVE TKA, completed 8/30.   Patient seen after elective surgery due to report of unintentional wt loss and decreased appetite.   When seen today, the pt restates she has lost weight. When asked her UBW, she says she does not have one. She was stable at 230 lbs "a while back" but says she has gradually lost weight since that time. Per Chart, the pt was >190 in 2014. She consistently weighed 80 lbs throughout 2016. After a long period without documentation, she represented earlier this year at a weight of 169.5 lbs. She was admitted at 164 lbs for this elective surgery.   Per discussion with patient, she has several different issues that could be contributing to her weight loss. She quickly tells RD she has "GI issues" as a result of having her colon resected "twice". Rd asked how much was removed. She only knows 9" was taken out at one surgery. She says she has had regularly occurring diarrhea in mornings since her surgeries, but it is not everyday, rather, approximately 2 days each week, she will experience 3 consecutive bouts of diarrhea in the morning. She has made dietary changes, but this overall has  not had any affect on her diarrhea. She drinks a large amount of fluid in mornings w/  Metformin, question if related? RD reviewed role of colon and made simple suggestions to help improve her diarrhea. She says she thinks "spicy items" make it worse.  RD reviewed diet recall to see if intake sounded adequate. Reviewing dietary recall, the patient has made many beneficial diet changes over the last few years, such as cutting back on her portion size and prioritizes more fruits/vegetables. The large majority of items she reports eating are low calorie. If she has gradually improved her diet, this also could lead to weight loss.   She notes several concerns about her medication dosages, saying she has become more sensitive to her anti-hyperglycemics and BP meds. She says her MD noted he would in fact likely decrease her BP further past surgery.  She does note she wants to lose more weight. We discussed an appropriate weight, FAD diets, healthy goals, genetic impact etc etc. She had thought her bmi was "morbidly obese" and was happy to learn this is not the case. Reviewed BMI guidelines in older adults.   Given she just had surgery, RD recommended supplements, to which she agreed. Will d/c Ensure Enlive and begin ensure surgery.   Meds: Vitamin D, Colace, Ensure Enlive BID, Colace, Insulin, Metformin, Amaryl, PPI, PRN pain/nausea medication Labs: BGs x3: 157->131->101, A1C:6.7,   Recent Labs  Lab 02/25/18 0411  NA 139  K 4.6  CL 105  CO2 27  BUN 13  CREATININE  0.71  CALCIUM 8.9  GLUCOSE 157*   NUTRITION - FOCUSED PHYSICAL EXAM: Deferred due to pain level POD1   Diet Order:   Diet Order            Diet Carb Modified Fluid consistency: Thin; Room service appropriate? Yes  Diet effective now             EDUCATION NEEDS:  Education needs have been addressed  Skin: Surgical incision to L leg  Last BM:  8/29  Height:  Ht Readings from Last 1 Encounters:  02/25/18 5\' 1"  (1.549 m)    Weight:  Wt Readings from Last 1 Encounters:  02/25/18 74.4 kg   Wt Readings from Last 10 Encounters:  02/25/18 74.4 kg  02/13/18 74.4 kg  10/23/17 76.9 kg  06/19/15 80.7 kg  05/14/15 81.4 kg  02/26/15 81.9 kg  02/18/15 80.7 kg  10/25/14 81.2 kg  08/23/14 83.2 kg  05/30/13 86.2 kg   Ideal Body Weight:  47.73 kg  BMI:  Body mass index is 30.99 kg/m.  Estimated Nutritional Needs:  Kcal:  1650-1850 (22-25 kcal/kg bw) Protein:  72-86g pro (1.2-1.4g/kg bw0 Fluid:  1.7-1.9 L fluid ( 67ml/kcal)  Burtis Junes RD, LDN, CNSC Clinical Nutrition Available Tues-Sat via Pager: [phone] 02/25/2018 1:13 PM

## 2018-02-25 NOTE — Progress Notes (Signed)
Physical Therapy Treatment Patient Details Name: Kathleen Oliver MRN: [id number] DOB: August 02, 1945 Today's Date: 02/25/2018    History of Present Illness Pt is a 72 y.o. F with significant PMH of HTN, stroke, DM who is s/p right total knee arthroplasty.    PT Comments    Patient with improvement this session as evidenced by increased ambulation distance to 75 feet using walker and min guard assist. Slightly improved gait speed and increased weightbearing through RLE. Continues to require min assist for transfers. Will need stair training prior to home as she has to negotiate 3 steps with railing to enter/exit home.   Follow Up Recommendations  Follow surgeon's recommendation for DC plan and follow-up therapies;Supervision for mobility/OOB     Equipment Recommendations  Rolling walker with 5" wheels    Recommendations for Other Services OT consult     Precautions / Restrictions Precautions Precautions: Fall;Knee Precaution Booklet Issued: Yes (comment) Precaution Comments: reviewed knee immobilizer use Required Braces or Orthoses: Knee Immobilizer - Right Restrictions Weight Bearing Restrictions: No    Mobility  Bed Mobility Overal bed mobility: Needs Assistance Bed Mobility: Supine to Sit     Supine to sit: Min assist     General bed mobility comments: min assist for negotiating RLE  Transfers Overall transfer level: Needs assistance Equipment used: Rolling walker (2 wheeled) Transfers: Sit to/from Stand Sit to Stand: Min assist         General transfer comment: patient with improvement on self cueing for hand placement. Up to min assist provided for transfer from toilet.   Ambulation/Gait Ambulation/Gait assistance: Min guard Gait Distance (Feet): 75 Feet Assistive device: Rolling walker (2 wheeled) Gait Pattern/deviations: Step-to pattern;Trunk flexed;Decreased stance time - right;Decreased weight shift to right Gait velocity: decreased Gait velocity  interpretation: <1.31 ft/sec, indicative of household ambulator General Gait Details: Patient with increased weightbearing through RLE. Decreased cueing provided for sequencing and patient with slightly increased gait speed compared to AM session. increased left lateral lean secondary to right sided guarding   Stairs             Wheelchair Mobility    Modified Rankin (Stroke Patients Only)       Balance Overall balance assessment: Needs assistance Sitting-balance support: No upper extremity supported;Feet supported Sitting balance-Leahy Scale: Good     Standing balance support: Bilateral upper extremity supported Standing balance-Leahy Scale: Poor                              Cognition Arousal/Alertness: Awake/alert Behavior During Therapy: WFL for tasks assessed/performed Overall Cognitive Status: Within Functional Limits for tasks assessed                                        Exercises Total Joint Exercises Towel Squeeze: 10 reps;Seated Long Arc Quad: 10 reps;Right;Seated    General Comments        Pertinent Vitals/Pain Pain Assessment: Faces Faces Pain Scale: Hurts whole lot Pain Location: right knee at rest and with movement Pain Descriptors / Indicators: Burning Pain Intervention(s): Premedicated before session;Monitored during session;Limited activity within patient's tolerance    Home Living                      Prior Function            PT Goals (current goals  can now be found in the care plan section) Acute Rehab PT Goals Patient Stated Goal: "go home" PT Goal Formulation: With patient Time For Goal Achievement: 03/02/18 Potential to Achieve Goals: Good Progress towards PT goals: Progressing toward goals    Frequency    7X/week      PT Plan Current plan remains appropriate    Co-evaluation              AM-PAC PT "6 Clicks" Daily Activity  Outcome Measure  Difficulty turning over in  bed (including adjusting bedclothes, sheets and blankets)?: A Little Difficulty moving from lying on back to sitting on the side of the bed? : Unable Difficulty sitting down on and standing up from a chair with arms (e.g., wheelchair, bedside commode, etc,.)?: Unable Help needed moving to and from a bed to chair (including a wheelchair)?: A Little Help needed walking in hospital room?: A Little Help needed climbing 3-5 steps with a railing? : A Lot 6 Click Score: 13    End of Session Equipment Utilized During Treatment: Gait belt;Right knee immobilizer Activity Tolerance: Patient tolerated treatment well Patient left: in chair;with call bell/phone within reach Nurse Communication: Mobility status PT Visit Diagnosis: Unsteadiness on feet (R26.81);Other abnormalities of gait and mobility (R26.89);Muscle weakness (generalized) (M62.81);Difficulty in walking, not elsewhere classified (R26.2);Pain Pain - Right/Left: Right Pain - part of body: Knee     Time: 1425-1500 PT Time Calculation (min) (ACUTE ONLY): 35 min  Charges:  $Gait Training: 23-37 mins                     Ellamae Sia, PT, DPT Acute Rehabilitation Services  Pager: [phone]    Willy Eddy 02/25/2018, 5:19 PM

## 2018-02-26 LAB — CBC
HEMATOCRIT: 32.4 % — AB (ref 36.0–46.0)
HEMOGLOBIN: 10.1 g/dL — AB (ref 12.0–15.0)
MCH: 28.5 pg (ref 26.0–34.0)
MCHC: 31.2 g/dL (ref 30.0–36.0)
MCV: 91.5 fL (ref 78.0–100.0)
Platelets: 232 10*3/uL (ref 150–400)
RBC: 3.54 MIL/uL — ABNORMAL LOW (ref 3.87–5.11)
RDW: 14.3 % (ref 11.5–15.5)
WBC: 8.8 10*3/uL (ref 4.0–10.5)

## 2018-02-26 LAB — GLUCOSE, CAPILLARY
GLUCOSE-CAPILLARY: 195 mg/dL — AB (ref 70–99)
Glucose-Capillary: 131 mg/dL — ABNORMAL HIGH (ref 70–99)
Glucose-Capillary: 97 mg/dL (ref 70–99)
Glucose-Capillary: 116 mg/dL — ABNORMAL HIGH (ref 70–99)

## 2018-02-26 NOTE — Plan of Care (Signed)
  Problem: Health Behavior/Discharge Planning: Goal: Ability to manage health-related needs will improve Outcome: Progressing   Problem: Clinical Measurements: Goal: Ability to maintain clinical measurements within normal limits will improve Outcome: Progressing Goal: Respiratory complications will improve Outcome: Completed/Met   Problem: Safety: Goal: Ability to remain free from injury will improve Outcome: Progressing   Problem: Skin Integrity: Goal: Risk for impaired skin integrity will decrease Outcome: Progressing

## 2018-02-26 NOTE — Progress Notes (Signed)
Physical Therapy Treatment Patient Details Name: Kathleen Oliver MRN: [id number] DOB: 1946-01-05 Today's Date: 02/26/2018    History of Present Illness Pt is a 72 y.o. F with significant PMH of HTN, stroke, DM who is s/p right total knee arthroplasty.    PT Comments    Patient progressing slowly and steadily towards physical therapy goals. Increased ambulation distance to 100 feet with walker and able to progress to step through pattern with cueing. Negotiated 3 steps with left railing and cane in right hand to simulate home environment in order to prepare for discharge home.    Follow Up Recommendations  Follow surgeon's recommendation for DC plan and follow-up therapies;Supervision for mobility/OOB     Equipment Recommendations  Rolling walker with 5" wheels    Recommendations for Other Services OT consult     Precautions / Restrictions Precautions Precautions: Fall;Knee Precaution Booklet Issued: Yes (comment) Precaution Comments: reviewed knee immobilizer use Required Braces or Orthoses: Knee Immobilizer - Right Restrictions Weight Bearing Restrictions: No RLE Weight Bearing: Weight bearing as tolerated    Mobility  Bed Mobility Overal bed mobility: Needs Assistance Bed Mobility: Supine to Sit     Supine to sit: Min guard     General bed mobility comments: instructed patient on using LLE in order to hook under RLE to progress off of bed for increased independence  Transfers Overall transfer level: Needs assistance Equipment used: Rolling walker (2 wheeled) Transfers: Sit to/from Stand Sit to Stand: Min guard         General transfer comment: decreased eccentric control from stand to sit  Ambulation/Gait Ambulation/Gait assistance: Min guard Gait Distance (Feet): 100 Feet Assistive device: Rolling walker (2 wheeled) Gait Pattern/deviations: Step-to pattern;Trunk flexed;Decreased stance time - right;Decreased weight shift to right;Step-through pattern Gait  velocity: decreased Gait velocity interpretation: <1.31 ft/sec, indicative of household ambulator General Gait Details: Patient able to progress to step through pattern with cues. Verbal cues also provided for increased left step clearance, decreased right step length.   Stairs Stairs: Yes Stairs assistance: Min guard Stair Management: One rail Left;With cane Number of Stairs: 3 General stair comments: Negotiated steps with left rail and cane in right hand with cues for sequencing   Wheelchair Mobility    Modified Rankin (Stroke Patients Only)       Balance Overall balance assessment: Needs assistance Sitting-balance support: No upper extremity supported;Feet supported Sitting balance-Leahy Scale: Good     Standing balance support: Bilateral upper extremity supported Standing balance-Leahy Scale: Poor                              Cognition Arousal/Alertness: Awake/alert Behavior During Therapy: WFL for tasks assessed/performed Overall Cognitive Status: Within Functional Limits for tasks assessed                                        Exercises      General Comments        Pertinent Vitals/Pain Pain Assessment: Faces Faces Pain Scale: Hurts even more Pain Location: right knee at rest and with movement Pain Descriptors / Indicators: Burning Pain Intervention(s): Monitored during session    Home Living                      Prior Function  PT Goals (current goals can now be found in the care plan section) Acute Rehab PT Goals Patient Stated Goal: "go home" PT Goal Formulation: With patient Time For Goal Achievement: 03/02/18 Potential to Achieve Goals: Good Progress towards PT goals: Progressing toward goals    Frequency    7X/week      PT Plan Current plan remains appropriate    Co-evaluation              AM-PAC PT "6 Clicks" Daily Activity  Outcome Measure  Difficulty turning over in bed  (including adjusting bedclothes, sheets and blankets)?: A Little Difficulty moving from lying on back to sitting on the side of the bed? : Unable Difficulty sitting down on and standing up from a chair with arms (e.g., wheelchair, bedside commode, etc,.)?: Unable Help needed moving to and from a bed to chair (including a wheelchair)?: A Little Help needed walking in hospital room?: A Little Help needed climbing 3-5 steps with a railing? : A Lot 6 Click Score: 13    End of Session Equipment Utilized During Treatment: Gait belt;Right knee immobilizer Activity Tolerance: Patient tolerated treatment well Patient left: in chair;with call bell/phone within reach Nurse Communication: Mobility status PT Visit Diagnosis: Unsteadiness on feet (R26.81);Other abnormalities of gait and mobility (R26.89);Muscle weakness (generalized) (M62.81);Difficulty in walking, not elsewhere classified (R26.2);Pain Pain - Right/Left: Right Pain - part of body: Knee     Time: 3007-6226 PT Time Calculation (min) (ACUTE ONLY): 32 min  Charges:  $Gait Training: 8-22 mins $Therapeutic Activity: 8-22 mins                     Kathleen Oliver, PT, DPT Acute Rehabilitation Services  Pager: [phone]    Willy Eddy 02/26/2018, 5:10 PM

## 2018-02-26 NOTE — Progress Notes (Signed)
Kathleen Oliver  MRN: [id number] DOB/Age: October 17, 1945 72 y.o. [facility] Procedure: Procedure(s) (LRB): RIGHT TOTAL KNEE ARTHROPLASTY (Right)     Subjective: Up in chair, feels better than yesterday. Passing flatus but no BM yet  Vital Signs Temp:  [98.8 F (37.1 C)] 98.8 F (37.1 C) (08/31 1950) Pulse Rate:  [75-91] 75 (08/31 1950) Resp:  [16] 16 (08/31 1950) BP: (136-145)/(60-61) 145/61 (08/31 2150) SpO2:  [91 %-95 %] 95 % (08/31 1950) Weight:  [74.4 kg] 74.4 kg (08/31 1302)  Lab Results Recent Labs    02/25/18 0411 02/26/18 0452  WBC 10.4 8.8  HGB 10.9* 10.1*  HCT 34.2* 32.4*  PLT 252 232   BMET Recent Labs    02/25/18 0411  NA 139  K 4.6  CL 105  CO2 27  GLUCOSE 157*  BUN 13  CREATININE 0.71  CALCIUM 8.9   INR  Date Value Ref Range Status  08/23/2014 1.15 0.00 - 1.49 Final     Exam Right knee dressing removed Incision looks good aquacel applied NVI        Plan Cont with PT/OT Plan is DC home once goals met  Jenetta Loges PA-C  02/26/2018, 9:38 AM Contact # [phone]

## 2018-02-26 NOTE — Evaluation (Signed)
Occupational Therapy Evaluation Patient Details Name: Kathleen Oliver MRN: [id number] DOB: 25-Feb-1946 Today's Date: 02/26/2018    History of Present Illness Pt is a 72 y.o. F with significant PMH of HTN, stroke, DM who is s/p right total knee arthroplasty.   Clinical Impression   PTA, pt was living alone and was independent; pt's daughter planning on staying at dc. Currently, pt requires Min A for LB ADLs and Min Guard A functional mobility using RW. Provided education on compensatory techniques for LB ADLs and toileting; pt demonstrated and verbalized understanding. Pt would benefit from further acute OT to facilitate safe dc and address safe tub transfer techniques. Recommend dc home once medically stable per physician.    Follow Up Recommendations  Follow surgeon's recommendation for DC plan and follow-up therapies;Supervision/Assistance - 24 hour    Equipment Recommendations  None recommended by OT    Recommendations for Other Services PT consult     Precautions / Restrictions Precautions Precautions: Fall;Knee Precaution Booklet Issued: Yes (comment) Precaution Comments: reviewed knee immobilizer use Required Braces or Orthoses: Knee Immobilizer - Right Restrictions Weight Bearing Restrictions: Yes RLE Weight Bearing: Weight bearing as tolerated      Mobility Bed Mobility Overal bed mobility: Needs Assistance Bed Mobility: Supine to Sit;Sit to Supine     Supine to sit: Min guard Sit to supine: Min guard   General bed mobility comments: Min Guard A for safety. increased time  Transfers Overall transfer level: Needs assistance Equipment used: Rolling walker (2 wheeled) Transfers: Sit to/from Stand Sit to Stand: Min guard         General transfer comment: Min Guard A for safety. VCs for hand placement    Balance Overall balance assessment: Needs assistance Sitting-balance support: No upper extremity supported;Feet supported Sitting balance-Leahy Scale: Good      Standing balance support: Bilateral upper extremity supported Standing balance-Leahy Scale: Poor                             ADL either performed or assessed with clinical judgement   ADL Overall ADL's : Needs assistance/impaired Eating/Feeding: Independent   Grooming: Set up;Supervision/safety;Standing;Wash/dry hands   Upper Body Bathing: Set up;Sitting   Lower Body Bathing: Min guard;Sit to/from stand   Upper Body Dressing : Set up;Supervision/safety;Sitting   Lower Body Dressing: Minimal assistance;Sit to/from stand Lower Body Dressing Details (indicate cue type and reason): Pt able to adjust L sock by bringing ankle up to knee. Unable to reach forward to reach right sock due to pain. Educating pt on LB dressing and compensatory techniques. Pt verbalized understanding.  Toilet Transfer: Min guard;Ambulation;RW;Grab Information systems manager Details (indicate cue type and reason): Min Guard A for safety Toileting- Clothing Manipulation and Hygiene: Set up;Supervision/safety;Sitting/lateral lean;Min guard;Sit to/from stand Toileting - Clothing Manipulation Details (indicate cue type and reason): Min Guard A for safety in standing   Tub/Shower Transfer Details (indicate cue type and reason): Discussed tub transfer techniques and pt will need further education on tub transfers Functional mobility during ADLs: Min guard;Rolling walker General ADL Comments: Pt demonstrating good process post surgery. Requiring Min A for LB dressing.      Vision         Perception     Praxis      Pertinent Vitals/Pain Pain Assessment: Faces Faces Pain Scale: Hurts even more Pain Location: R knee Pain Descriptors / Indicators: Burning Pain Intervention(s): Monitored during session;Repositioned;Limited activity within patient's tolerance  Hand Dominance     Extremity/Trunk Assessment Upper Extremity Assessment Upper Extremity Assessment: Overall WFL for tasks  assessed   Lower Extremity Assessment Lower Extremity Assessment: Defer to PT evaluation RLE Deficits / Details: gross weakness; unable to perform heel slide or straight leg raise   Cervical / Trunk Assessment Cervical / Trunk Assessment: Normal   Communication Communication Communication: No difficulties   Cognition Arousal/Alertness: Awake/alert Behavior During Therapy: WFL for tasks assessed/performed Overall Cognitive Status: Within Functional Limits for tasks assessed                                     General Comments       Exercises     Shoulder Instructions      Home Living Family/patient expects to be discharged to:: Private residence Living Arrangements: Alone Available Help at Discharge: Family;Available 24 hours/day(Daughter staying at dc. taking month off of work) Type of Home: House Home Access: Stairs to enter [employer] of Steps: 3 Entrance Stairs-Rails: Left Home Layout: One level     Bathroom Shower/Tub: Teacher, early years/pre: Handicapped height     Home Equipment: Environmental consultant - 2 wheels;Cane - single point;Wheelchair - [employer];Shower seat          Prior Functioning/Environment Level of Independence: Independent with assistive device(s)        Comments: Independent with ADL's, uses cane for outdoor mobility, drives        OT Problem List: Decreased strength;Decreased range of motion;Decreased activity tolerance;Impaired balance (sitting and/or standing);Decreased knowledge of use of DME or AE;Decreased knowledge of precautions;Pain      OT Treatment/Interventions: Self-care/ADL training;Therapeutic exercise;Energy conservation;DME and/or AE instruction;Therapeutic activities;Patient/family education    OT Goals(Current goals can be found in the care plan section) Acute Rehab OT Goals Patient Stated Goal: "go home" OT Goal Formulation: With patient Time For Goal Achievement:  03/12/18 Potential to Achieve Goals: Good  OT Frequency: Min 2X/week   Barriers to D/C:            Co-evaluation              AM-PAC PT "6 Clicks" Daily Activity     Outcome Measure Help from another person eating meals?: None Help from another person taking care of personal grooming?: None Help from another person toileting, which includes using toliet, bedpan, or urinal?: A Little Help from another person bathing (including washing, rinsing, drying)?: A Little Help from another person to put on and taking off regular upper body clothing?: None Help from another person to put on and taking off regular lower body clothing?: A Little 6 Click Score: 21   End of Session Equipment Utilized During Treatment: Rolling walker;Right knee immobilizer Nurse Communication: Mobility status  Activity Tolerance: Patient tolerated treatment well Patient left: in bed;with call bell/phone within reach  OT Visit Diagnosis: Unsteadiness on feet (R26.81);Other abnormalities of gait and mobility (R26.89);Muscle weakness (generalized) (M62.81);Pain Pain - Right/Left: Right Pain - part of body: Knee                Time: 8563-1497 OT Time Calculation (min): 28 min Charges:  OT General Charges $OT Visit: 1 Visit OT Evaluation $OT Eval Low Complexity: 1 Low OT Treatments $Self Care/Home Management : 8-22 mins  Yianna Tersigni MSOT, OTR/L Acute Rehab Pager: [phone] Office: [place] 02/26/2018, 12:01 PM

## 2018-02-27 LAB — CBC
HEMATOCRIT: 32 % — AB (ref 36.0–46.0)
HEMOGLOBIN: 10.2 g/dL — AB (ref 12.0–15.0)
MCH: 29.2 pg (ref 26.0–34.0)
MCHC: 31.9 g/dL (ref 30.0–36.0)
MCV: 91.7 fL (ref 78.0–100.0)
Platelets: 232 10*3/uL (ref 150–400)
RBC: 3.49 MIL/uL — AB (ref 3.87–5.11)
RDW: 14.5 % (ref 11.5–15.5)
WBC: 8.3 10*3/uL (ref 4.0–10.5)

## 2018-02-27 LAB — GLUCOSE, CAPILLARY
GLUCOSE-CAPILLARY: 112 mg/dL — AB (ref 70–99)
Glucose-Capillary: 131 mg/dL — ABNORMAL HIGH (ref 70–99)

## 2018-02-27 NOTE — Progress Notes (Signed)
Pt discharge education and instructions completed with pt and daughter at bedside; both voices understanding and denies any questions. Pt IV removed; right knee incision dsg remains clean, dry and intact. Pt handed her prescriptions for Norco and Robaxin. Pt discharge home with daughter to transport her home. Pt transported off unit via wheelchair with belongings and daughter to the side. Delia Heady RN

## 2018-02-27 NOTE — Plan of Care (Signed)
  Problem: Health Behavior/Discharge Planning: Goal: Ability to manage health-related needs will improve Outcome: Progressing   Problem: Clinical Measurements: Goal: Ability to maintain clinical measurements within normal limits will improve Outcome: Progressing Goal: Will remain free from infection Outcome: Progressing   

## 2018-02-27 NOTE — Progress Notes (Signed)
Subjective: 3 Days Post-Op Procedure(s) (LRB): RIGHT TOTAL KNEE ARTHROPLASTY (Right) Patient reports pain as moderate.  Well controlled with oral pain meds.  Tolerating regular diet.  No c/o.  Eager to go home.  Objective: Vital signs in last 24 hours: Temp:  [98.2 F (36.8 C)-98.8 F (37.1 C)] 98.5 F (36.9 C) (09/02 0419) Pulse Rate:  [69-107] 73 (09/02 0419) Resp:  [16] 16 (09/02 0419) BP: (111-130)/(50-62) 117/50 (09/02 0419) SpO2:  [91 %-95 %] 91 % (09/02 0419)  Intake/Output from previous day: 09/01 0701 - 09/02 0700 In: 960 [P.O.:960] Out: -  Intake/Output this shift: No intake/output data recorded.  Recent Labs    02/25/18 0411 02/26/18 0452 02/27/18 0411  HGB 10.9* 10.1* 10.2*   Recent Labs    02/26/18 0452 02/27/18 0411  WBC 8.8 8.3  RBC 3.54* 3.49*  HCT 32.4* 32.0*  PLT 232 232   Recent Labs    02/25/18 0411  NA 139  K 4.6  CL 105  CO2 27  BUN 13  CREATININE 0.71  GLUCOSE 157*  CALCIUM 8.9   No results for input(s): LABPT, INR in the last 72 hours.  PE:  wn wd woman in nad.  R knee dressed and dry.  NVI at right foot.  Anticipated LOS equal to or greater than 2 midnights due to - Age 71 and older with one or more of the following:  - Obesity  - Expected need for hospital services (PT, OT, Nursing) required for safe  discharge  - Anticipated need for postoperative skilled nursing care or inpatient rehab  - Active co-morbidities: Diabetes OR   - Unanticipated findings during/Post Surgery: None  - Patient is a high risk of re-admission due to: None   Assessment/Plan: 3 Days Post-Op Procedure(s) (LRB): RIGHT TOTAL KNEE ARTHROPLASTY (Right) Discharge home with home health    Wylene Simmer 02/27/2018, 7:47 AM

## 2018-02-27 NOTE — Progress Notes (Signed)
Occupational Therapy Treatment Patient Details Name: Kathleen Oliver MRN: [id number] DOB: Jun 18, 1946 Today's Date: 02/27/2018    History of present illness Pt is a 72 y.o. F with significant PMH of HTN, stroke, DM who is s/p right total knee arthroplasty.   OT comments  Pt demonstrating good progress toward OT goals this session. She was able to complete toilet transfers with min guard assist with use of grab bars. Pt reports that she has a 3-in-1 at home and educated pt concerning using this over commode to ensure safety in powering up from commode as pt does not have a grab bar near toilet. She was able to verbalize strategies for LB ADL without cues this session. Pt additionally educated on multiple options for safe tub/shower transfers. She was able to complete tub transfer using both shower seat and 3-in-1 but was safer with 3-in-1 completing with min assist for stability. Provided handout detailing use of 3-in-1 as shower seat and pt verbalized understanding. Pt planning to D/C home today per her report. Will continue to follow while admitted.   Follow Up Recommendations  Follow surgeon's recommendation for DC plan and follow-up therapies;Supervision/Assistance - 24 hour    Equipment Recommendations  None recommended by OT    Recommendations for Other Services PT consult    Precautions / Restrictions Precautions Precautions: Fall;Knee Precaution Comments: Reviewed knee immobilizer use Required Braces or Orthoses: Knee Immobilizer - Right Knee Immobilizer - Right: (in bed, unless in CPM) Restrictions Weight Bearing Restrictions: Yes RLE Weight Bearing: Weight bearing as tolerated       Mobility Bed Mobility Overal bed mobility: Needs Assistance Bed Mobility: Supine to Sit     Supine to sit: Min assist Sit to supine: Min guard   General bed mobility comments: Min assist to progress RLE. She is able to hook LLE under RLE to progress off of bed with assistance. Pt verbalized  that she plans to use a leash that she has at home to manage RLE into and out of bed.   Transfers Overall transfer level: Needs assistance Equipment used: Rolling walker (2 wheeled) Transfers: Sit to/from Stand Sit to Stand: Min guard         General transfer comment: Supervision for standing from chair; min guard from commode    Balance Overall balance assessment: Needs assistance Sitting-balance support: No upper extremity supported;Feet supported Sitting balance-Leahy Scale: Good     Standing balance support: Bilateral upper extremity supported;During functional activity;No upper extremity supported Standing balance-Leahy Scale: Fair Standing balance comment: able to stand at sink without UE support                           ADL either performed or assessed with clinical judgement   ADL Overall ADL's : Needs assistance/impaired     Grooming: Supervision/safety;Standing;Wash/dry hands                   Toilet Transfer: Supervision/safety;RW;Ambulation;Grab Environmental education officer and Hygiene: Min guard;Sit to/from stand   Tub/ Shower Transfer: Minimal assistance;Ambulation;Tub transfer;3 in 1;Shower Scientist, research (medical) Details (indicate cue type and reason): Attempted with both shower seat and 3-in-1. However, pt safer to utilize 3-in-1 and provided handout and pt able to demonstrate understanding.  Functional mobility during ADLs: Supervision/safety;Min guard;Minimal assistance;Rolling walker General ADL Comments: Pt educated concerning use of 3-in-1 over toilet for elevated seat with armrests as well as use of 3-in-1 as a shower seat.  Vision       Perception     Praxis      Cognition Arousal/Alertness: Awake/alert Behavior During Therapy: WFL for tasks assessed/performed Overall Cognitive Status: Within Functional Limits for tasks assessed                                           Exercises     Shoulder Instructions       General Comments      Pertinent Vitals/ Pain       Pain Assessment: Faces Faces Pain Scale: Hurts little more Pain Location: R knee during mobility Pain Descriptors / Indicators: Aching;Guarding Pain Intervention(s): Limited activity within patient's tolerance;Monitored during session;Repositioned  Home Living                                          Prior Functioning/Environment              Frequency  Min 2X/week        Progress Toward Goals  OT Goals(current goals can now be found in the care plan section)  Progress towards OT goals: Progressing toward goals  Acute Rehab OT Goals Patient Stated Goal: "go home" OT Goal Formulation: With patient Time For Goal Achievement: 03/12/18 Potential to Achieve Goals: Good  Plan Discharge plan remains appropriate    Co-evaluation                 AM-PAC PT "6 Clicks" Daily Activity     Outcome Measure   Help from another person eating meals?: None Help from another person taking care of personal grooming?: None Help from another person toileting, which includes using toliet, bedpan, or urinal?: A Little Help from another person bathing (including washing, rinsing, drying)?: A Little Help from another person to put on and taking off regular upper body clothing?: None Help from another person to put on and taking off regular lower body clothing?: A Little 6 Click Score: 21    End of Session Equipment Utilized During Treatment: Rolling walker;Right knee immobilizer  OT Visit Diagnosis: Unsteadiness on feet (R26.81);Other abnormalities of gait and mobility (R26.89);Muscle weakness (generalized) (M62.81);Pain Pain - Right/Left: Right Pain - part of body: Knee   Activity Tolerance Patient tolerated treatment well   Patient Left in bed;with call bell/phone within reach   Nurse Communication Mobility status        Time: 1941-7408 OT Time  Calculation (min): 34 min  Charges: OT General Charges $OT Visit: 1 Visit OT Treatments $Self Care/Home Management : 23-37 mins  Norman Herrlich, MS OTR/L  Pager: [place] A Kathleen Oliver 02/27/2018, 11:18 AM

## 2018-02-27 NOTE — Progress Notes (Signed)
Physical Therapy Treatment Patient Details Name: Kathleen Oliver MRN: [id number] DOB: 1946/02/16 Today's Date: 02/27/2018    History of Present Illness Pt is a 72 y.o. F with significant PMH of HTN, stroke, DM who is s/p right total knee arthroplasty.    PT Comments    Pt performed gait training and review of HEP in prep for d/c home.  Pt continues to lack extension and educated on the use of bone foam when resting.  Informed nursing patient is ready for d/c home.    Follow Up Recommendations  Follow surgeon's recommendation for DC plan and follow-up therapies;Supervision for mobility/OOB     Equipment Recommendations  Rolling walker with 5" wheels    Recommendations for Other Services OT consult     Precautions / Restrictions Precautions Precautions: Fall;Knee Precaution Booklet Issued: Yes (comment) Restrictions Weight Bearing Restrictions: Yes RLE Weight Bearing: Weight bearing as tolerated    Mobility  Bed Mobility Overal bed mobility: Needs Assistance Bed Mobility: Supine to Sit     Supine to sit: Supervision     General bed mobility comments: Pt slow and guarded but able to advance LEs to edge of bed unassisted using LLE to advance RLE (surgical).    Transfers Overall transfer level: Needs assistance Equipment used: Rolling walker (2 wheeled) Transfers: Sit to/from Stand Sit to Stand: Supervision         General transfer comment: Cues for hand placement to and from seated surface.    Ambulation/Gait Ambulation/Gait assistance: Min guard Gait Distance (Feet): 60 Feet(+ 10 ft back from bathroom to recliner chair.  ) Assistive device: Rolling walker (2 wheeled) Gait Pattern/deviations: Step-to pattern;Trunk flexed;Decreased stance time - right;Decreased weight shift to right;Step-through pattern Gait velocity: decreased   General Gait Details: Patient able to progress to step through pattern with cues. Verbal cues also provided for increased left step  clearance, decreased right step length.   Stairs Stairs: (Pt declined need for repeated trial of stair training and able to verbally recall technique.  )           Wheelchair Mobility    Modified Rankin (Stroke Patients Only)       Balance Overall balance assessment: Needs assistance   Sitting balance-Leahy Scale: Good     Standing balance support: Bilateral upper extremity supported;During functional activity;No upper extremity supported Standing balance-Leahy Scale: Fair Standing balance comment: able to stand at sink without UE support                            Cognition Arousal/Alertness: Awake/alert Behavior During Therapy: WFL for tasks assessed/performed Overall Cognitive Status: Within Functional Limits for tasks assessed                                        Exercises Total Joint Exercises Ankle Circles/Pumps: AROM;Both;20 reps;Supine Quad Sets: AROM;Right;10 reps;Supine Towel Squeeze: AROM;Both;10 reps;Supine Short Arc Quad: AROM;Right;10 reps;Supine Heel Slides: AROM;Right;10 reps;Supine;AAROM Hip ABduction/ADduction: AROM;Right;10 reps;Supine;AAROM Straight Leg Raises: AROM;Right;10 reps;Supine;AAROM Goniometric ROM: 15-70 degrees R knee.      General Comments        Pertinent Vitals/Pain Pain Assessment: 0-10 Pain Score: 6  Pain Location: R knee during mobility Pain Descriptors / Indicators: Aching;Guarding Pain Intervention(s): Monitored during session;Repositioned    Home Living  Prior Function            PT Goals (current goals can now be found in the care plan section) Acute Rehab PT Goals Patient Stated Goal: "go home" Potential to Achieve Goals: Good Progress towards PT goals: Progressing toward goals    Frequency           PT Plan Current plan remains appropriate    Co-evaluation              AM-PAC PT "6 Clicks" Daily Activity  Outcome Measure   Difficulty turning over in bed (including adjusting bedclothes, sheets and blankets)?: A Little Difficulty moving from lying on back to sitting on the side of the bed? : Unable Difficulty sitting down on and standing up from a chair with arms (e.g., wheelchair, bedside commode, etc,.)?: Unable Help needed moving to and from a bed to chair (including a wheelchair)?: A Little Help needed walking in hospital room?: A Little Help needed climbing 3-5 steps with a railing? : A Lot 6 Click Score: 13    End of Session Equipment Utilized During Treatment: Gait belt;Right knee immobilizer Activity Tolerance: Patient tolerated treatment well Patient left: in chair;with call bell/phone within reach Nurse Communication: Mobility status PT Visit Diagnosis: Unsteadiness on feet (R26.81);Other abnormalities of gait and mobility (R26.89);Muscle weakness (generalized) (M62.81);Difficulty in walking, not elsewhere classified (R26.2);Pain Pain - Right/Left: Right Pain - part of body: Knee     Time: 5697-9480 PT Time Calculation (min) (ACUTE ONLY): 26 min  Charges:  $Gait Training: 8-22 mins $Therapeutic Exercise: 8-22 mins                     Governor [place], PTA pager [phone]    Cristela Blue 02/27/2018, 3:06 PM

## 2018-02-27 NOTE — Care Management Note (Signed)
Case Management Note  Patient Details  Name: Kathleen Oliver MRN: [id number] Date of Birth: 07-31-1945  Subjective/Objective:  72 yr old female s/p right total knee arthroplasty.                     Action/Plan: Patient was preoperatively setup with [facility], no changes. Patient will have support at discharge.     Expected Discharge Date:  02/27/18               Expected Discharge Plan:  [place]  In-House Referral:  NA  Discharge planning Services  CM Consult  Post Acute Care Choice:  Home Health Choice offered to:  Patient  DME Arranged:  (has DME) DME Agency:  [employer]  HH Arranged:  PT HH Agency:  [facility] (formerly [employer])  Status of Service:  Completed, signed off  If discussed at [employer], dates discussed:    Additional Comments:  Ninfa Meeker, RN 02/27/2018, 12:24 PM

## 2018-02-28 ENCOUNTER — Encounter (HOSPITAL_COMMUNITY): Payer: Self-pay | Admitting: Orthopedic Surgery

## 2018-02-28 DIAGNOSIS — M179 Osteoarthritis of knee, unspecified: Secondary | ICD-10-CM | POA: Diagnosis not present

## 2018-02-28 DIAGNOSIS — Z471 Aftercare following joint replacement surgery: Secondary | ICD-10-CM | POA: Diagnosis not present

## 2018-02-28 DIAGNOSIS — I2510 Atherosclerotic heart disease of native coronary artery without angina pectoris: Secondary | ICD-10-CM | POA: Diagnosis not present

## 2018-02-28 DIAGNOSIS — J449 Chronic obstructive pulmonary disease, unspecified: Secondary | ICD-10-CM | POA: Diagnosis not present

## 2018-02-28 DIAGNOSIS — K5730 Diverticulosis of large intestine without perforation or abscess without bleeding: Secondary | ICD-10-CM | POA: Diagnosis not present

## 2018-02-28 DIAGNOSIS — E1140 Type 2 diabetes mellitus with diabetic neuropathy, unspecified: Secondary | ICD-10-CM | POA: Diagnosis not present

## 2018-02-28 DIAGNOSIS — E669 Obesity, unspecified: Secondary | ICD-10-CM | POA: Diagnosis not present

## 2018-02-28 DIAGNOSIS — Z7902 Long term (current) use of antithrombotics/antiplatelets: Secondary | ICD-10-CM | POA: Diagnosis not present

## 2018-02-28 DIAGNOSIS — I10 Essential (primary) hypertension: Secondary | ICD-10-CM | POA: Diagnosis not present

## 2018-02-28 DIAGNOSIS — F419 Anxiety disorder, unspecified: Secondary | ICD-10-CM | POA: Diagnosis not present

## 2018-02-28 NOTE — Anesthesia Postprocedure Evaluation (Signed)
Anesthesia Post Note  Patient: Kathleen Oliver  Procedure(s) Performed: RIGHT TOTAL KNEE ARTHROPLASTY (Right Knee)     Patient location during evaluation: PACU Anesthesia Type: General Level of consciousness: awake and alert Pain management: pain level controlled Vital Signs Assessment: post-procedure vital signs reviewed and stable Respiratory status: spontaneous breathing, nonlabored ventilation and respiratory function stable Cardiovascular status: blood pressure returned to baseline and stable Postop Assessment: no apparent nausea or vomiting Anesthetic complications: no    Last Vitals:  Vitals:   02/27/18 0304 02/27/18 0419  BP: 111/62 (!) 117/50  Pulse: (!) 107 73  Resp:  16  Temp:  36.9 C  SpO2:  91%    Last Pain:  Vitals:   02/27/18 1213  TempSrc:   PainSc: [place]

## 2018-03-01 DIAGNOSIS — I10 Essential (primary) hypertension: Secondary | ICD-10-CM | POA: Diagnosis not present

## 2018-03-01 DIAGNOSIS — Z7902 Long term (current) use of antithrombotics/antiplatelets: Secondary | ICD-10-CM | POA: Diagnosis not present

## 2018-03-01 DIAGNOSIS — J449 Chronic obstructive pulmonary disease, unspecified: Secondary | ICD-10-CM | POA: Diagnosis not present

## 2018-03-01 DIAGNOSIS — F419 Anxiety disorder, unspecified: Secondary | ICD-10-CM | POA: Diagnosis not present

## 2018-03-01 DIAGNOSIS — E669 Obesity, unspecified: Secondary | ICD-10-CM | POA: Diagnosis not present

## 2018-03-01 DIAGNOSIS — Z471 Aftercare following joint replacement surgery: Secondary | ICD-10-CM | POA: Diagnosis not present

## 2018-03-01 DIAGNOSIS — I2510 Atherosclerotic heart disease of native coronary artery without angina pectoris: Secondary | ICD-10-CM | POA: Diagnosis not present

## 2018-03-01 DIAGNOSIS — K5730 Diverticulosis of large intestine without perforation or abscess without bleeding: Secondary | ICD-10-CM | POA: Diagnosis not present

## 2018-03-01 DIAGNOSIS — E1140 Type 2 diabetes mellitus with diabetic neuropathy, unspecified: Secondary | ICD-10-CM | POA: Diagnosis not present

## 2018-03-01 NOTE — Discharge Summary (Signed)
Physician Discharge Summary  Patient ID: Kathleen Oliver MRN: [id number] DOB/AGE: September 22, 1945 72 y.o.  Admit date: 02/24/2018 Discharge date: 02/27/18  Procedures:  Procedure(s) (LRB): RIGHT TOTAL KNEE ARTHROPLASTY (Right)  Attending Physician:  Dr. Esmond Plants  Admission Diagnoses:   Right knee end stage osteoarthritis  Discharge Diagnoses:  Active Problems:   Status post total knee replacement, right  Past Medical History:  Diagnosis Date  . Bronchial asthma   . Chronic bronchitis ([place])    "numerous years; not q yr"   . Complication of anesthesia 1999   "had 3 seizures & ended up in ICU days after I'd had anesthesia"   . Diverticulosis of colon   . Dysrhythmia    PVC's  . GERD (gastroesophageal reflux disease)   . History of hiatal hernia   . HTN (hypertension)   . Hypercholesteremia   . Hypertension   . Obesity   . Osteoarthrosis   . Pneumonia 2000's X 1  . Recurrent UTI   . Seizures ([place]) 1999 X 3   "days after anesthesia"- no further issues  . Stroke ([place])    /MRI from 1999;; showed I'd had had several small ones"-no residual effects.  . Type II diabetes mellitus ([place])       PCP: [name], MD   Discharged Condition: good  Hospital Course:  Patient underwent the above stated procedure on 02/24/2018. Patient tolerated the procedure well and brought to the recovery room in good condition and subsequently to the floor.     Disposition:  with follow up in 2 weeks   Follow-up Information    Netta Cedars, MD. Call in 2 weeks.   Specialty:  Orthopedic Surgery Why:  [phone] Contact information: [address] [place] [address] [phone]        [facility].   Specialty:  [place] Why:  A representative from [facility] will contact you to arrange start date and time for your therapy. Contact information: [address] [place] [address] [phone]            Discharge Instructions    Call MD / Call 911   Complete by:  As directed    If you experience chest pain or shortness of breath, CALL 911 and be transported to the hospital emergency room.  If you develope a fever above 101 F, pus (white drainage) or increased drainage or redness at the wound, or calf pain, call your surgeon's office.   Constipation Prevention   Complete by:  As directed    Drink plenty of fluids.  Prune juice may be helpful.  You may use a stool softener, such as Colace (over the counter) 100 mg twice a day.  Use MiraLax (over the counter) for constipation as needed.   Diet - low sodium heart healthy   Complete by:  As directed    Do not put a pillow under the knee. Place it under the heel.   Complete by:  As directed    Increase activity slowly as tolerated   Complete by:  As directed    Weight bearing as tolerated   Complete by:  As directed    Laterality:  right   Extremity:  Lower      Allergies as of 02/27/2018      Reactions   Cinobac [cinoxacin]    hives   Codeine Itching   Penicillins Hives   Has patient had a PCN  reaction causing immediate rash, facial/tongue/throat swelling, SOB or lightheadedness with hypotension: Yes Has patient had a PCN reaction causing severe rash involving mucus membranes or skin necrosis: Yes Has patient had a PCN reaction that required hospitalization No Has patient had a PCN reaction occurring within the last 10 years: No If all of the above answers are "NO", then may proceed with Cephalosporin use.   Tizanidine Other (See Comments)   Heart flutter       Medication List    TAKE these medications   acetaminophen 500 MG tablet Commonly known as:  TYLENOL Take 500 mg by mouth 2 (two) times daily as needed for moderate pain or headache.   albuterol 108 (90 Base) MCG/ACT inhaler Commonly known as:  PROVENTIL HFA;VENTOLIN HFA Inhale 2 puffs into the lungs every 6 (six) hours as needed for wheezing or shortness of  breath.   amLODipine 10 MG tablet Commonly known as:  NORVASC Take 10 mg by mouth at bedtime.   clopidogrel 75 MG tablet Commonly known as:  PLAVIX Take 75 mg by mouth at bedtime.   dexlansoprazole 60 MG capsule Commonly known as:  DEXILANT Take 60 mg by mouth daily before supper.   gabapentin 100 MG capsule Commonly known as:  NEURONTIN Take 100 mg by mouth at bedtime.   GAS-X PO Take 1 capsule by mouth daily as needed (abdominal pain).   glimepiride 2 MG tablet Commonly known as:  AMARYL Take 1 mg by mouth 2 (two) times daily.   HYDROcodone-acetaminophen 5-325 MG tablet Commonly known as:  NORCO/VICODIN Take 1-2 tablets by mouth every 6 (six) hours as needed for moderate pain.   latanoprost 0.005 % ophthalmic solution Commonly known as:  XALATAN Place 1 drop into both eyes at bedtime.   Lidocaine 4 % Ptch Apply 1 patch topically daily as needed (pain).   lidocaine 4 % cream Commonly known as:  LMX Apply 1 application topically as needed (pain).   lisinopril 10 MG tablet Commonly known as:  PRINIVIL,ZESTRIL Take 10 mg by mouth every morning.   loperamide 1 MG/5ML solution Commonly known as:  IMODIUM Take 1 mg by mouth as needed for diarrhea or loose stools.   metFORMIN 500 MG 24 hr tablet Commonly known as:  GLUCOPHAGE-XR Take 500 mg by mouth 2 (two) times daily.   methocarbamol 500 MG tablet Commonly known as:  ROBAXIN Take 1 tablet (500 mg total) by mouth 3 (three) times daily as needed.   OPCON-A OP Apply 1 drop to eye 2 (two) times daily as needed (allergies.).   [place] 420 MG/3.5ML Soct Generic drug:  Evolocumab with Infusor Inject 420 mg into the skin every 30 (thirty) days.   sodium chloride 0.65 % Soln nasal spray Commonly known as:  OCEAN Place 1 spray into both nostrils daily as needed for congestion.   traMADol 50 MG tablet Commonly known as:  ULTRAM Take 50 mg by mouth every 6 (six) hours as needed (Back pain.).    Vitamin D 2000 units Caps Take 2,000 Units by mouth daily.            Discharge Care Instructions  (From admission, onward)         Start     Ordered   02/27/18 0000  Weight bearing as tolerated    Question Answer Comment  Laterality right   Extremity Lower      02/27/18 0747           Brad Dixon PA-C, MPAS  Physician Assistant [facility] now [employer] [facility] [address], [address], [place], [place] [address] Phone: [phone] [email or web address] Facebook  [employer]

## 2018-03-02 DIAGNOSIS — E1140 Type 2 diabetes mellitus with diabetic neuropathy, unspecified: Secondary | ICD-10-CM | POA: Diagnosis not present

## 2018-03-02 DIAGNOSIS — I10 Essential (primary) hypertension: Secondary | ICD-10-CM | POA: Diagnosis not present

## 2018-03-03 DIAGNOSIS — I2510 Atherosclerotic heart disease of native coronary artery without angina pectoris: Secondary | ICD-10-CM | POA: Diagnosis not present

## 2018-03-03 DIAGNOSIS — E669 Obesity, unspecified: Secondary | ICD-10-CM | POA: Diagnosis not present

## 2018-03-03 DIAGNOSIS — I10 Essential (primary) hypertension: Secondary | ICD-10-CM | POA: Diagnosis not present

## 2018-03-03 DIAGNOSIS — E1140 Type 2 diabetes mellitus with diabetic neuropathy, unspecified: Secondary | ICD-10-CM | POA: Diagnosis not present

## 2018-03-03 DIAGNOSIS — F419 Anxiety disorder, unspecified: Secondary | ICD-10-CM | POA: Diagnosis not present

## 2018-03-03 DIAGNOSIS — K5730 Diverticulosis of large intestine without perforation or abscess without bleeding: Secondary | ICD-10-CM | POA: Diagnosis not present

## 2018-03-03 DIAGNOSIS — Z7902 Long term (current) use of antithrombotics/antiplatelets: Secondary | ICD-10-CM | POA: Diagnosis not present

## 2018-03-03 DIAGNOSIS — J449 Chronic obstructive pulmonary disease, unspecified: Secondary | ICD-10-CM | POA: Diagnosis not present

## 2018-03-03 DIAGNOSIS — Z471 Aftercare following joint replacement surgery: Secondary | ICD-10-CM | POA: Diagnosis not present

## 2018-03-06 DIAGNOSIS — Z471 Aftercare following joint replacement surgery: Secondary | ICD-10-CM | POA: Diagnosis not present

## 2018-03-06 DIAGNOSIS — K5730 Diverticulosis of large intestine without perforation or abscess without bleeding: Secondary | ICD-10-CM | POA: Diagnosis not present

## 2018-03-06 DIAGNOSIS — Z7902 Long term (current) use of antithrombotics/antiplatelets: Secondary | ICD-10-CM | POA: Diagnosis not present

## 2018-03-06 DIAGNOSIS — E1140 Type 2 diabetes mellitus with diabetic neuropathy, unspecified: Secondary | ICD-10-CM | POA: Diagnosis not present

## 2018-03-06 DIAGNOSIS — F419 Anxiety disorder, unspecified: Secondary | ICD-10-CM | POA: Diagnosis not present

## 2018-03-06 DIAGNOSIS — I2510 Atherosclerotic heart disease of native coronary artery without angina pectoris: Secondary | ICD-10-CM | POA: Diagnosis not present

## 2018-03-06 DIAGNOSIS — E669 Obesity, unspecified: Secondary | ICD-10-CM | POA: Diagnosis not present

## 2018-03-06 DIAGNOSIS — I10 Essential (primary) hypertension: Secondary | ICD-10-CM | POA: Diagnosis not present

## 2018-03-06 DIAGNOSIS — J449 Chronic obstructive pulmonary disease, unspecified: Secondary | ICD-10-CM | POA: Diagnosis not present

## 2018-03-09 DIAGNOSIS — M1711 Unilateral primary osteoarthritis, right knee: Secondary | ICD-10-CM | POA: Diagnosis not present

## 2018-03-09 DIAGNOSIS — Z4789 Encounter for other orthopedic aftercare: Secondary | ICD-10-CM | POA: Diagnosis not present

## 2018-03-10 DIAGNOSIS — I10 Essential (primary) hypertension: Secondary | ICD-10-CM | POA: Diagnosis not present

## 2018-03-10 DIAGNOSIS — N390 Urinary tract infection, site not specified: Secondary | ICD-10-CM | POA: Diagnosis not present

## 2018-03-10 DIAGNOSIS — Z471 Aftercare following joint replacement surgery: Secondary | ICD-10-CM | POA: Diagnosis not present

## 2018-03-10 DIAGNOSIS — E1140 Type 2 diabetes mellitus with diabetic neuropathy, unspecified: Secondary | ICD-10-CM | POA: Diagnosis not present

## 2018-03-10 DIAGNOSIS — Z7902 Long term (current) use of antithrombotics/antiplatelets: Secondary | ICD-10-CM | POA: Diagnosis not present

## 2018-03-10 DIAGNOSIS — K5730 Diverticulosis of large intestine without perforation or abscess without bleeding: Secondary | ICD-10-CM | POA: Diagnosis not present

## 2018-03-10 DIAGNOSIS — J449 Chronic obstructive pulmonary disease, unspecified: Secondary | ICD-10-CM | POA: Diagnosis not present

## 2018-03-10 DIAGNOSIS — F419 Anxiety disorder, unspecified: Secondary | ICD-10-CM | POA: Diagnosis not present

## 2018-03-10 DIAGNOSIS — I2510 Atherosclerotic heart disease of native coronary artery without angina pectoris: Secondary | ICD-10-CM | POA: Diagnosis not present

## 2018-03-10 DIAGNOSIS — E669 Obesity, unspecified: Secondary | ICD-10-CM | POA: Diagnosis not present

## 2018-03-13 DIAGNOSIS — M25561 Pain in right knee: Secondary | ICD-10-CM | POA: Diagnosis not present

## 2018-03-15 DIAGNOSIS — M25561 Pain in right knee: Secondary | ICD-10-CM | POA: Diagnosis not present

## 2018-03-17 DIAGNOSIS — M25561 Pain in right knee: Secondary | ICD-10-CM | POA: Diagnosis not present

## 2018-03-20 DIAGNOSIS — M25561 Pain in right knee: Secondary | ICD-10-CM | POA: Diagnosis not present

## 2018-03-22 DIAGNOSIS — M25561 Pain in right knee: Secondary | ICD-10-CM | POA: Diagnosis not present

## 2018-03-24 DIAGNOSIS — M25561 Pain in right knee: Secondary | ICD-10-CM | POA: Diagnosis not present

## 2018-03-27 DIAGNOSIS — M25561 Pain in right knee: Secondary | ICD-10-CM | POA: Diagnosis not present

## 2018-03-27 DIAGNOSIS — R6889 Other general symptoms and signs: Secondary | ICD-10-CM | POA: Diagnosis not present

## 2018-03-30 DIAGNOSIS — M25561 Pain in right knee: Secondary | ICD-10-CM | POA: Diagnosis not present

## 2018-03-30 DIAGNOSIS — M179 Osteoarthritis of knee, unspecified: Secondary | ICD-10-CM | POA: Diagnosis not present

## 2018-04-03 DIAGNOSIS — M25561 Pain in right knee: Secondary | ICD-10-CM | POA: Diagnosis not present

## 2018-04-03 DIAGNOSIS — R6889 Other general symptoms and signs: Secondary | ICD-10-CM | POA: Diagnosis not present

## 2018-04-06 DIAGNOSIS — M25561 Pain in right knee: Secondary | ICD-10-CM | POA: Diagnosis not present

## 2018-04-10 DIAGNOSIS — M25561 Pain in right knee: Secondary | ICD-10-CM | POA: Diagnosis not present

## 2018-04-10 DIAGNOSIS — R6889 Other general symptoms and signs: Secondary | ICD-10-CM | POA: Diagnosis not present

## 2018-04-13 DIAGNOSIS — M25561 Pain in right knee: Secondary | ICD-10-CM | POA: Diagnosis not present

## 2018-04-13 DIAGNOSIS — R6889 Other general symptoms and signs: Secondary | ICD-10-CM | POA: Diagnosis not present

## 2018-04-17 DIAGNOSIS — R6889 Other general symptoms and signs: Secondary | ICD-10-CM | POA: Diagnosis not present

## 2018-04-17 DIAGNOSIS — M25561 Pain in right knee: Secondary | ICD-10-CM | POA: Diagnosis not present

## 2018-04-24 DIAGNOSIS — M25561 Pain in right knee: Secondary | ICD-10-CM | POA: Diagnosis not present

## 2018-04-26 DIAGNOSIS — Z23 Encounter for immunization: Secondary | ICD-10-CM | POA: Diagnosis not present

## 2018-04-27 DIAGNOSIS — M25561 Pain in right knee: Secondary | ICD-10-CM | POA: Diagnosis not present

## 2018-04-27 DIAGNOSIS — R6889 Other general symptoms and signs: Secondary | ICD-10-CM | POA: Diagnosis not present

## 2018-04-30 DIAGNOSIS — M179 Osteoarthritis of knee, unspecified: Secondary | ICD-10-CM | POA: Diagnosis not present

## 2018-05-02 DIAGNOSIS — M25561 Pain in right knee: Secondary | ICD-10-CM | POA: Diagnosis not present

## 2018-05-04 DIAGNOSIS — M25561 Pain in right knee: Secondary | ICD-10-CM | POA: Diagnosis not present

## 2018-05-08 DIAGNOSIS — M25561 Pain in right knee: Secondary | ICD-10-CM | POA: Diagnosis not present

## 2018-05-10 DIAGNOSIS — H02834 Dermatochalasis of left upper eyelid: Secondary | ICD-10-CM | POA: Diagnosis not present

## 2018-05-10 DIAGNOSIS — H02831 Dermatochalasis of right upper eyelid: Secondary | ICD-10-CM | POA: Diagnosis not present

## 2018-05-10 DIAGNOSIS — E119 Type 2 diabetes mellitus without complications: Secondary | ICD-10-CM | POA: Diagnosis not present

## 2018-05-10 DIAGNOSIS — H26492 Other secondary cataract, left eye: Secondary | ICD-10-CM | POA: Diagnosis not present

## 2018-05-15 DIAGNOSIS — M25561 Pain in right knee: Secondary | ICD-10-CM | POA: Diagnosis not present

## 2018-05-22 DIAGNOSIS — G8918 Other acute postprocedural pain: Secondary | ICD-10-CM | POA: Diagnosis not present

## 2018-05-22 DIAGNOSIS — Z96651 Presence of right artificial knee joint: Secondary | ICD-10-CM | POA: Diagnosis not present

## 2018-05-22 DIAGNOSIS — M24661 Ankylosis, right knee: Secondary | ICD-10-CM | POA: Diagnosis not present

## 2018-05-22 DIAGNOSIS — M24562 Contracture, left knee: Secondary | ICD-10-CM | POA: Diagnosis not present

## 2018-05-22 DIAGNOSIS — T84092A Other mechanical complication of internal right knee prosthesis, initial encounter: Secondary | ICD-10-CM | POA: Diagnosis not present

## 2018-05-22 DIAGNOSIS — M25561 Pain in right knee: Secondary | ICD-10-CM | POA: Diagnosis not present

## 2018-05-23 DIAGNOSIS — M25561 Pain in right knee: Secondary | ICD-10-CM | POA: Diagnosis not present

## 2018-05-24 DIAGNOSIS — M25561 Pain in right knee: Secondary | ICD-10-CM | POA: Diagnosis not present

## 2018-05-30 DIAGNOSIS — M25561 Pain in right knee: Secondary | ICD-10-CM | POA: Diagnosis not present

## 2018-05-30 DIAGNOSIS — M179 Osteoarthritis of knee, unspecified: Secondary | ICD-10-CM | POA: Diagnosis not present

## 2018-05-31 DIAGNOSIS — M25561 Pain in right knee: Secondary | ICD-10-CM | POA: Diagnosis not present

## 2018-06-01 DIAGNOSIS — M25561 Pain in right knee: Secondary | ICD-10-CM | POA: Diagnosis not present

## 2018-06-02 DIAGNOSIS — M25561 Pain in right knee: Secondary | ICD-10-CM | POA: Diagnosis not present

## 2018-06-05 DIAGNOSIS — M25561 Pain in right knee: Secondary | ICD-10-CM | POA: Diagnosis not present

## 2018-06-27 DIAGNOSIS — K5792 Diverticulitis of intestine, part unspecified, without perforation or abscess without bleeding: Secondary | ICD-10-CM | POA: Diagnosis not present

## 2018-06-27 DIAGNOSIS — R1032 Left lower quadrant pain: Secondary | ICD-10-CM | POA: Diagnosis not present

## 2018-06-29 DIAGNOSIS — Z7984 Long term (current) use of oral hypoglycemic drugs: Secondary | ICD-10-CM | POA: Diagnosis not present

## 2018-06-29 DIAGNOSIS — K521 Toxic gastroenteritis and colitis: Secondary | ICD-10-CM | POA: Diagnosis not present

## 2018-06-29 DIAGNOSIS — I10 Essential (primary) hypertension: Secondary | ICD-10-CM | POA: Diagnosis not present

## 2018-06-29 DIAGNOSIS — E1140 Type 2 diabetes mellitus with diabetic neuropathy, unspecified: Secondary | ICD-10-CM | POA: Diagnosis not present

## 2018-06-29 DIAGNOSIS — K5792 Diverticulitis of intestine, part unspecified, without perforation or abscess without bleeding: Secondary | ICD-10-CM | POA: Diagnosis not present

## 2018-06-30 DIAGNOSIS — M179 Osteoarthritis of knee, unspecified: Secondary | ICD-10-CM | POA: Diagnosis not present

## 2018-07-28 ENCOUNTER — Other Ambulatory Visit: Payer: Self-pay | Admitting: Geriatric Medicine

## 2018-07-28 DIAGNOSIS — I700 Atherosclerosis of aorta: Secondary | ICD-10-CM | POA: Diagnosis not present

## 2018-07-28 DIAGNOSIS — R1033 Periumbilical pain: Secondary | ICD-10-CM | POA: Diagnosis not present

## 2018-07-28 DIAGNOSIS — R197 Diarrhea, unspecified: Secondary | ICD-10-CM | POA: Diagnosis not present

## 2018-07-28 DIAGNOSIS — E041 Nontoxic single thyroid nodule: Secondary | ICD-10-CM

## 2018-07-28 DIAGNOSIS — I10 Essential (primary) hypertension: Secondary | ICD-10-CM | POA: Diagnosis not present

## 2018-07-28 DIAGNOSIS — E1140 Type 2 diabetes mellitus with diabetic neuropathy, unspecified: Secondary | ICD-10-CM | POA: Diagnosis not present

## 2018-07-28 DIAGNOSIS — E7800 Pure hypercholesterolemia, unspecified: Secondary | ICD-10-CM | POA: Diagnosis not present

## 2018-07-31 DIAGNOSIS — M179 Osteoarthritis of knee, unspecified: Secondary | ICD-10-CM | POA: Diagnosis not present

## 2018-08-01 ENCOUNTER — Ambulatory Visit
Admission: RE | Admit: 2018-08-01 | Discharge: 2018-08-01 | Disposition: A | Discharge status: Home / Self Care | Payer: Medicare HMO | Source: Ambulatory Visit | Attending: Geriatric Medicine | Admitting: Geriatric Medicine

## 2018-08-01 DIAGNOSIS — E041 Nontoxic single thyroid nodule: Secondary | ICD-10-CM | POA: Diagnosis not present

## 2018-08-09 DIAGNOSIS — Z1231 Encounter for screening mammogram for malignant neoplasm of breast: Secondary | ICD-10-CM | POA: Diagnosis not present

## 2018-08-16 DIAGNOSIS — R1013 Epigastric pain: Secondary | ICD-10-CM | POA: Diagnosis not present

## 2018-08-16 DIAGNOSIS — E1140 Type 2 diabetes mellitus with diabetic neuropathy, unspecified: Secondary | ICD-10-CM | POA: Diagnosis not present

## 2018-08-16 DIAGNOSIS — M25512 Pain in left shoulder: Secondary | ICD-10-CM | POA: Diagnosis not present

## 2018-08-16 DIAGNOSIS — I10 Essential (primary) hypertension: Secondary | ICD-10-CM | POA: Diagnosis not present

## 2018-08-29 DIAGNOSIS — J3089 Other allergic rhinitis: Secondary | ICD-10-CM | POA: Diagnosis not present

## 2018-08-29 DIAGNOSIS — R42 Dizziness and giddiness: Secondary | ICD-10-CM | POA: Diagnosis not present

## 2018-08-29 DIAGNOSIS — H9313 Tinnitus, bilateral: Secondary | ICD-10-CM | POA: Diagnosis not present

## 2018-08-29 DIAGNOSIS — I10 Essential (primary) hypertension: Secondary | ICD-10-CM | POA: Diagnosis not present

## 2018-08-29 DIAGNOSIS — G4489 Other headache syndrome: Secondary | ICD-10-CM | POA: Diagnosis not present

## 2018-08-29 DIAGNOSIS — M179 Osteoarthritis of knee, unspecified: Secondary | ICD-10-CM | POA: Diagnosis not present

## 2018-08-30 DIAGNOSIS — H6123 Impacted cerumen, bilateral: Secondary | ICD-10-CM | POA: Diagnosis not present

## 2018-09-29 DIAGNOSIS — M179 Osteoarthritis of knee, unspecified: Secondary | ICD-10-CM | POA: Diagnosis not present

## 2018-10-29 DIAGNOSIS — M179 Osteoarthritis of knee, unspecified: Secondary | ICD-10-CM | POA: Diagnosis not present

## 2018-11-04 IMAGING — US US THYROID
1 series · 13 of 25 positions shown · non-contrast
Comparison: Prior thyroid ultrasound 01/27/2016

CLINICAL DATA: Goiter. 71-year-old female with a history of
right-sided thyroid nodule. She previously underwent fine-needle
aspiration biopsy in Thursday July, 2015.

EXAM:
THYROID ULTRASOUND
TECHNIQUE: Ultrasound examination of the thyroid gland and adjacent soft
tissues was performed.

[Series 1: us thyroid · 0.06mm/px · 13 of 47 slices shown]
[im 1/47]
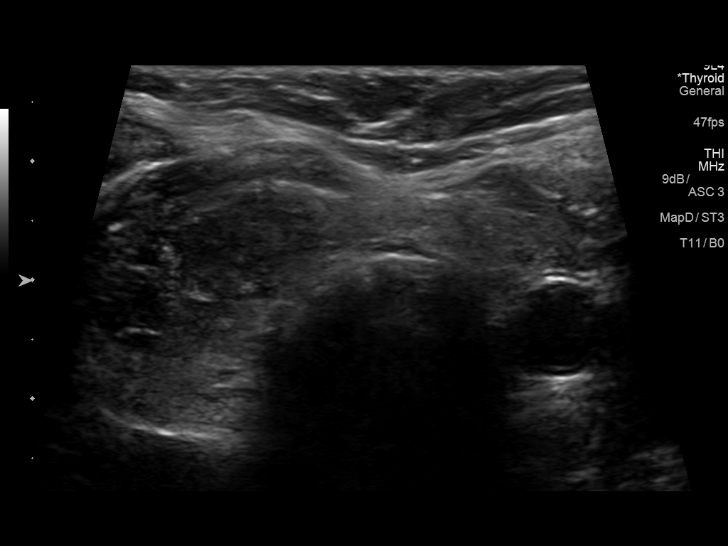
[im 4/47]
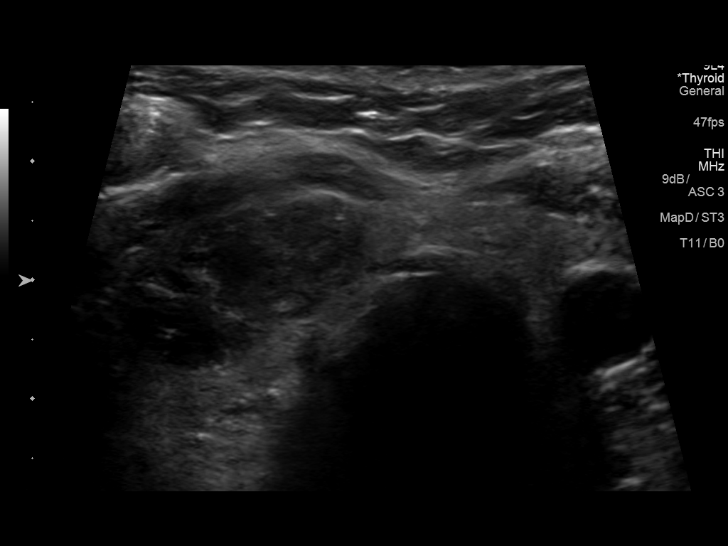
[im 8/47]
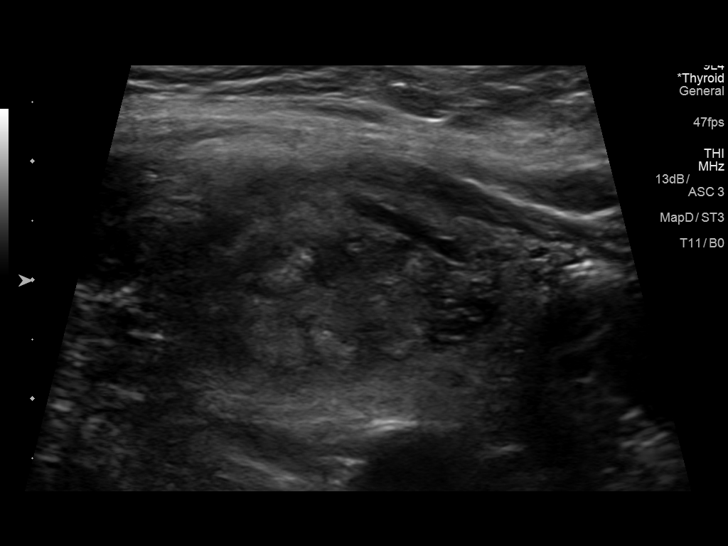
[im 12/47]
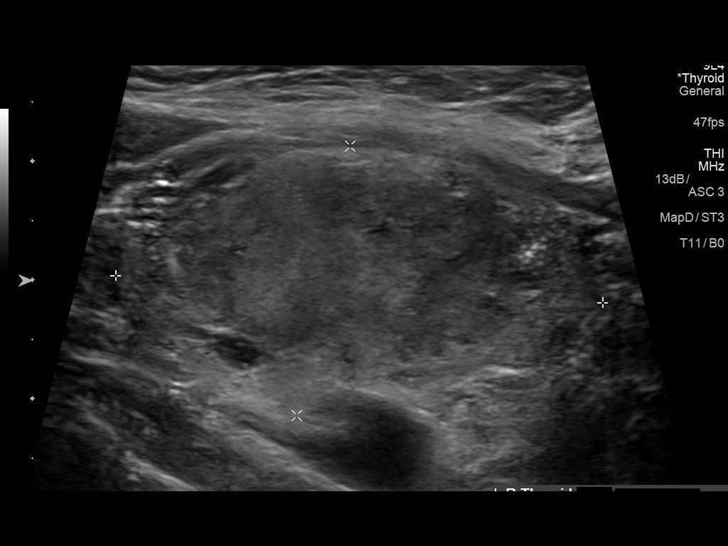
[im 16/47]
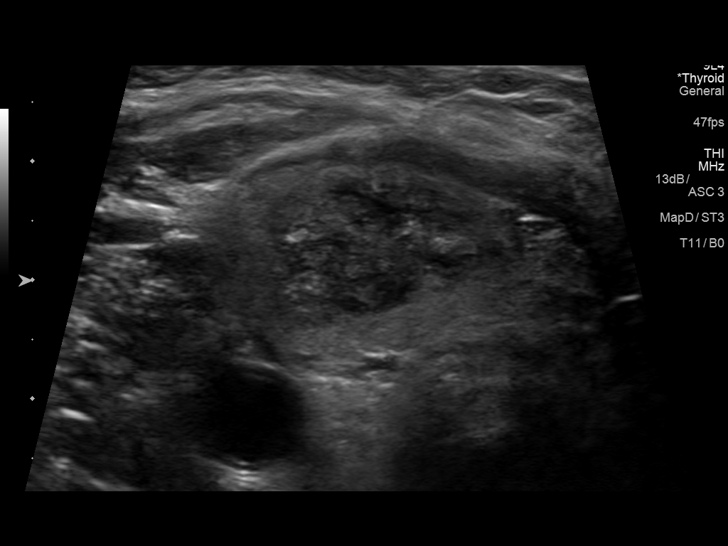
[im 20/47]
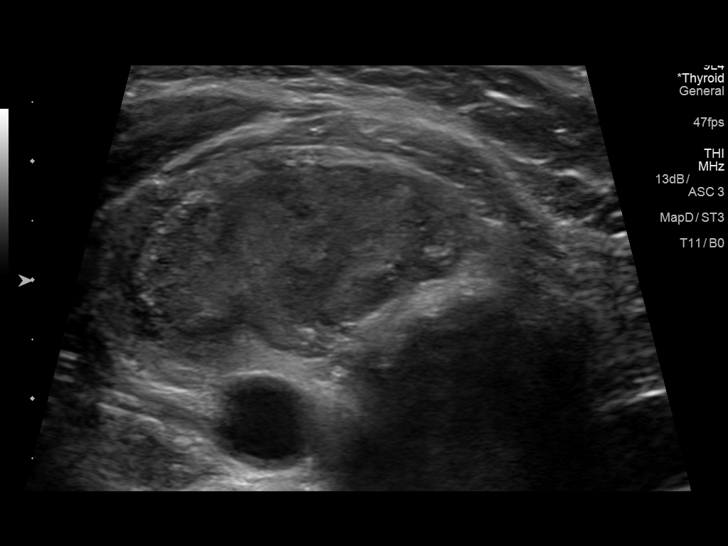
[im 24/47]
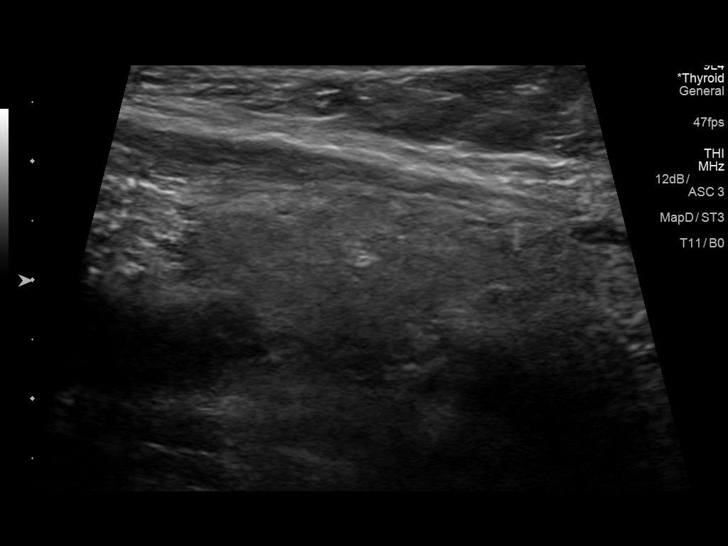
[im 27/47]
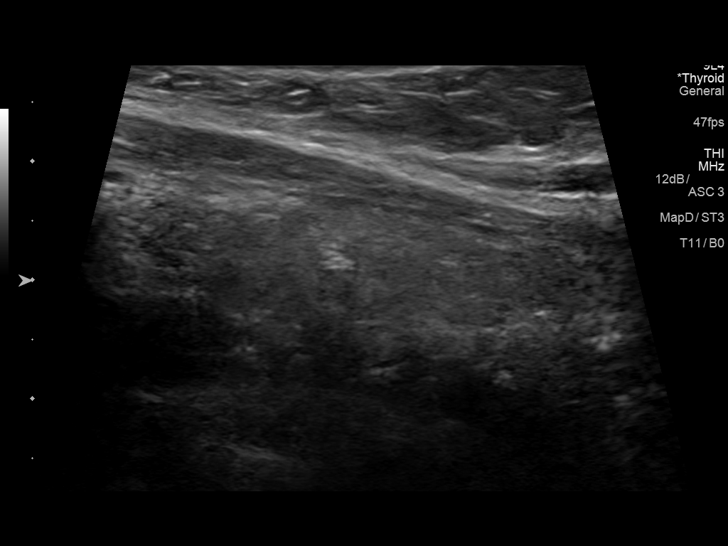
[im 31/47]
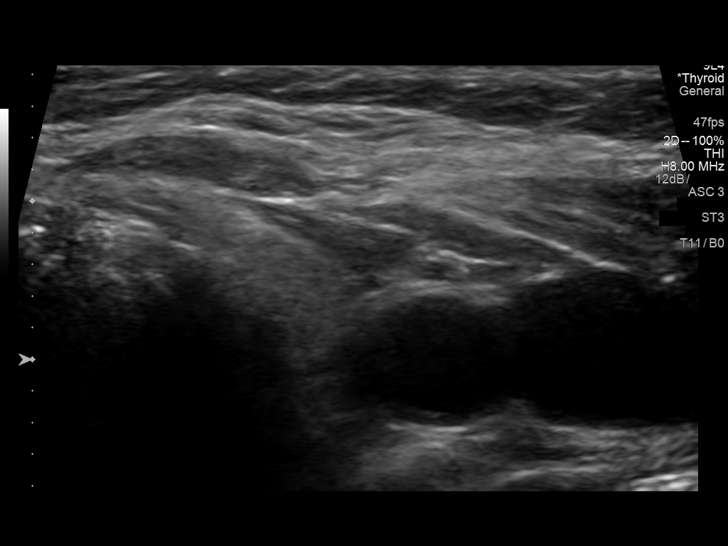
[im 35/47]
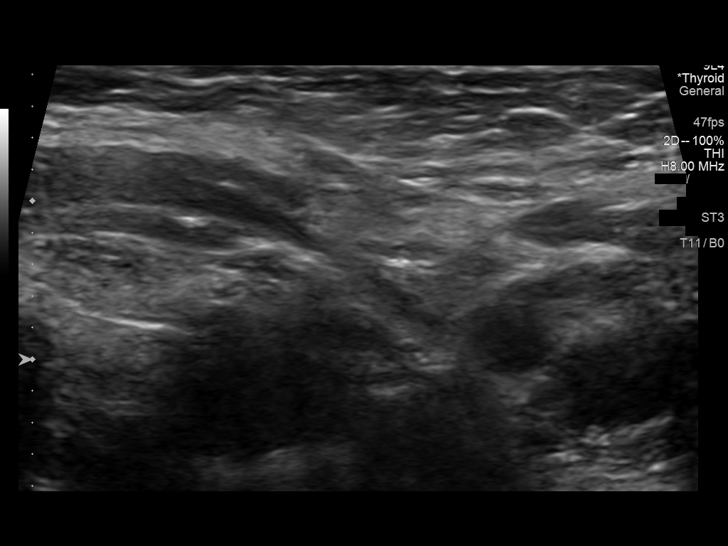
[im 39/47]
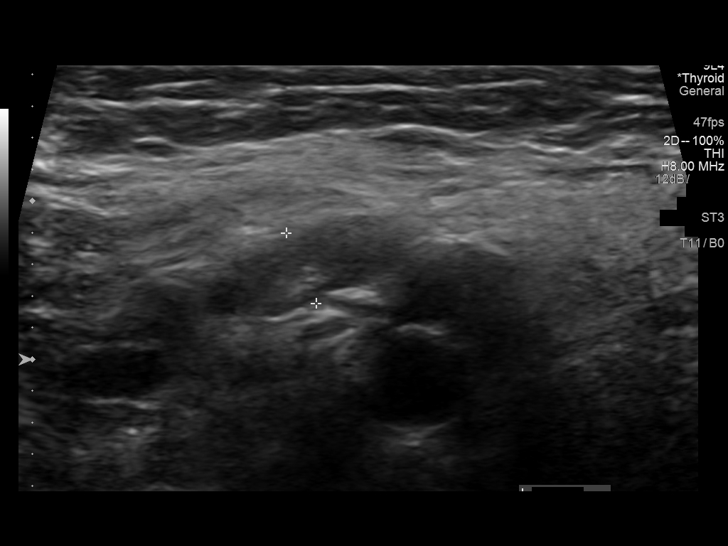
[im 43/47]
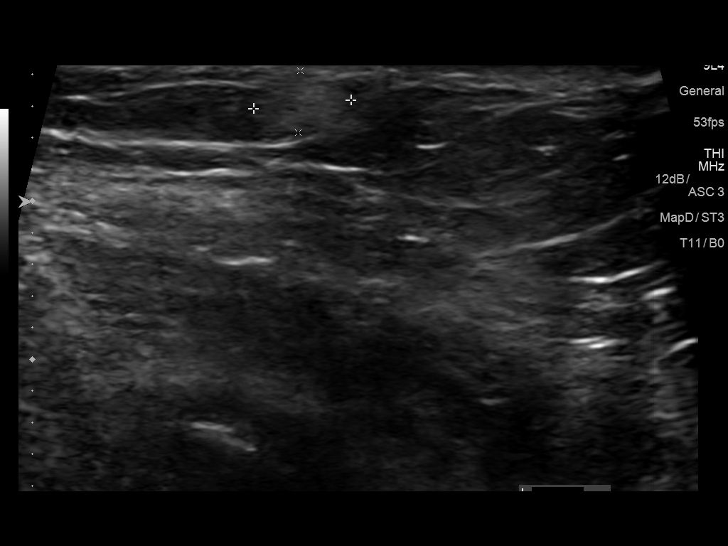
[im 47/47]
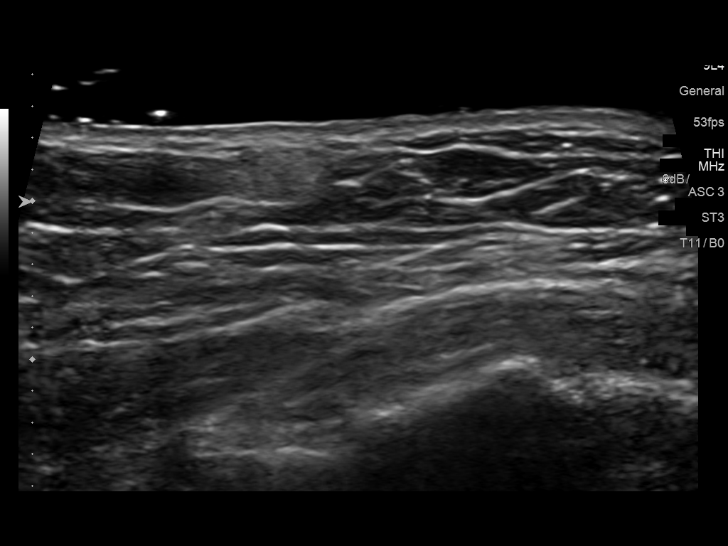

[13 of 25 positions shown; findings below may reference images not displayed]

FINDINGS: Parenchymal Echotexture: Mildly heterogenous

Isthmus: 0.4 cm

Right lobe: 4.1 x 2.3 x 3.4 cm

Left lobe: 3.5 x 1.1 x 1.4 cm

_________________________________________________________

Estimated total number of nodules >/= 1 cm: 1

Number of spongiform nodules >/=  2 cm not described below (TR1): 0

Number of mixed cystic and solid nodules >/= 1.5 cm not described
below (TR2): 0

_________________________________________________________

Small, heterogeneous thyroid gland. A nodular mass occupies the
majority of the right thyroid lobe. The mass is primarily solid and
heterogeneously hypoechoic measuring 3.7 x 3.0 x 1.7 cm. This is
minimally enlarged compared to 3.1 x 2.2 x 1.5 cm previously.
IMPRESSION: Slight interval enlargement of the previously biopsied right-sided
thyroid nodule which now measures 3.7 x 3.0 x 1.7 cm compared to
x 2.2 x 1.5 cm in Wednesday January, 2016. Recommend correlation with prior
biopsy results.

The above is in keeping with the ACR TI-RADS recommendations - [HOSPITAL] 4667;[DATE].

## 2018-11-29 DIAGNOSIS — M179 Osteoarthritis of knee, unspecified: Secondary | ICD-10-CM | POA: Diagnosis not present

## 2018-12-07 DIAGNOSIS — M25532 Pain in left wrist: Secondary | ICD-10-CM | POA: Diagnosis not present

## 2018-12-07 DIAGNOSIS — M654 Radial styloid tenosynovitis [de Quervain]: Secondary | ICD-10-CM | POA: Diagnosis not present

## 2018-12-12 DIAGNOSIS — Z79899 Other long term (current) drug therapy: Secondary | ICD-10-CM | POA: Diagnosis not present

## 2018-12-12 DIAGNOSIS — I10 Essential (primary) hypertension: Secondary | ICD-10-CM | POA: Diagnosis not present

## 2018-12-12 DIAGNOSIS — E1140 Type 2 diabetes mellitus with diabetic neuropathy, unspecified: Secondary | ICD-10-CM | POA: Diagnosis not present

## 2018-12-13 DIAGNOSIS — H401131 Primary open-angle glaucoma, bilateral, mild stage: Secondary | ICD-10-CM | POA: Diagnosis not present

## 2018-12-13 DIAGNOSIS — H26492 Other secondary cataract, left eye: Secondary | ICD-10-CM | POA: Diagnosis not present

## 2018-12-13 DIAGNOSIS — Z961 Presence of intraocular lens: Secondary | ICD-10-CM | POA: Diagnosis not present

## 2018-12-13 DIAGNOSIS — E119 Type 2 diabetes mellitus without complications: Secondary | ICD-10-CM | POA: Diagnosis not present

## 2018-12-28 DIAGNOSIS — M25532 Pain in left wrist: Secondary | ICD-10-CM | POA: Diagnosis not present

## 2018-12-28 DIAGNOSIS — M65832 Other synovitis and tenosynovitis, left forearm: Secondary | ICD-10-CM | POA: Diagnosis not present

## 2018-12-29 DIAGNOSIS — M179 Osteoarthritis of knee, unspecified: Secondary | ICD-10-CM | POA: Diagnosis not present

## 2019-01-16 DIAGNOSIS — Z471 Aftercare following joint replacement surgery: Secondary | ICD-10-CM | POA: Diagnosis not present

## 2019-01-16 DIAGNOSIS — Z96651 Presence of right artificial knee joint: Secondary | ICD-10-CM | POA: Diagnosis not present

## 2019-02-15 DIAGNOSIS — R51 Headache: Secondary | ICD-10-CM | POA: Diagnosis not present

## 2019-02-15 DIAGNOSIS — I10 Essential (primary) hypertension: Secondary | ICD-10-CM | POA: Diagnosis not present

## 2019-02-15 DIAGNOSIS — E1140 Type 2 diabetes mellitus with diabetic neuropathy, unspecified: Secondary | ICD-10-CM | POA: Diagnosis not present

## 2019-03-02 ENCOUNTER — Other Ambulatory Visit: Payer: Self-pay | Admitting: Geriatric Medicine

## 2019-03-02 ENCOUNTER — Ambulatory Visit
Admission: RE | Admit: 2019-03-02 | Discharge: 2019-03-02 | Disposition: A | Discharge status: Home / Self Care | Payer: Medicare HMO | Source: Ambulatory Visit | Attending: Geriatric Medicine | Admitting: Geriatric Medicine

## 2019-03-02 DIAGNOSIS — Z79899 Other long term (current) drug therapy: Secondary | ICD-10-CM | POA: Diagnosis not present

## 2019-03-02 DIAGNOSIS — M546 Pain in thoracic spine: Secondary | ICD-10-CM

## 2019-03-02 DIAGNOSIS — M47814 Spondylosis without myelopathy or radiculopathy, thoracic region: Secondary | ICD-10-CM | POA: Diagnosis not present

## 2019-03-02 DIAGNOSIS — E559 Vitamin D deficiency, unspecified: Secondary | ICD-10-CM | POA: Diagnosis not present

## 2019-03-02 DIAGNOSIS — I10 Essential (primary) hypertension: Secondary | ICD-10-CM | POA: Diagnosis not present

## 2019-03-02 DIAGNOSIS — E7800 Pure hypercholesterolemia, unspecified: Secondary | ICD-10-CM | POA: Diagnosis not present

## 2019-03-02 DIAGNOSIS — K9089 Other intestinal malabsorption: Secondary | ICD-10-CM | POA: Diagnosis not present

## 2019-03-02 DIAGNOSIS — Z0000 Encounter for general adult medical examination without abnormal findings: Secondary | ICD-10-CM | POA: Diagnosis not present

## 2019-03-02 DIAGNOSIS — E1140 Type 2 diabetes mellitus with diabetic neuropathy, unspecified: Secondary | ICD-10-CM | POA: Diagnosis not present

## 2019-03-02 DIAGNOSIS — I700 Atherosclerosis of aorta: Secondary | ICD-10-CM | POA: Diagnosis not present

## 2019-03-02 DIAGNOSIS — Z1389 Encounter for screening for other disorder: Secondary | ICD-10-CM | POA: Diagnosis not present

## 2019-03-28 DIAGNOSIS — Z23 Encounter for immunization: Secondary | ICD-10-CM | POA: Diagnosis not present

## 2019-04-18 DIAGNOSIS — R69 Illness, unspecified: Secondary | ICD-10-CM | POA: Diagnosis not present

## 2019-04-23 IMAGING — CR DG CHEST 2V
2 series · 2 of 2 positions shown · non-contrast
Comparison: CT chest and chest x-ray dated May 01, 2015.

CLINICAL DATA: Weakness and shortness of breath since this morning.

EXAM:
CHEST - 2 VIEW

[w chest pa]
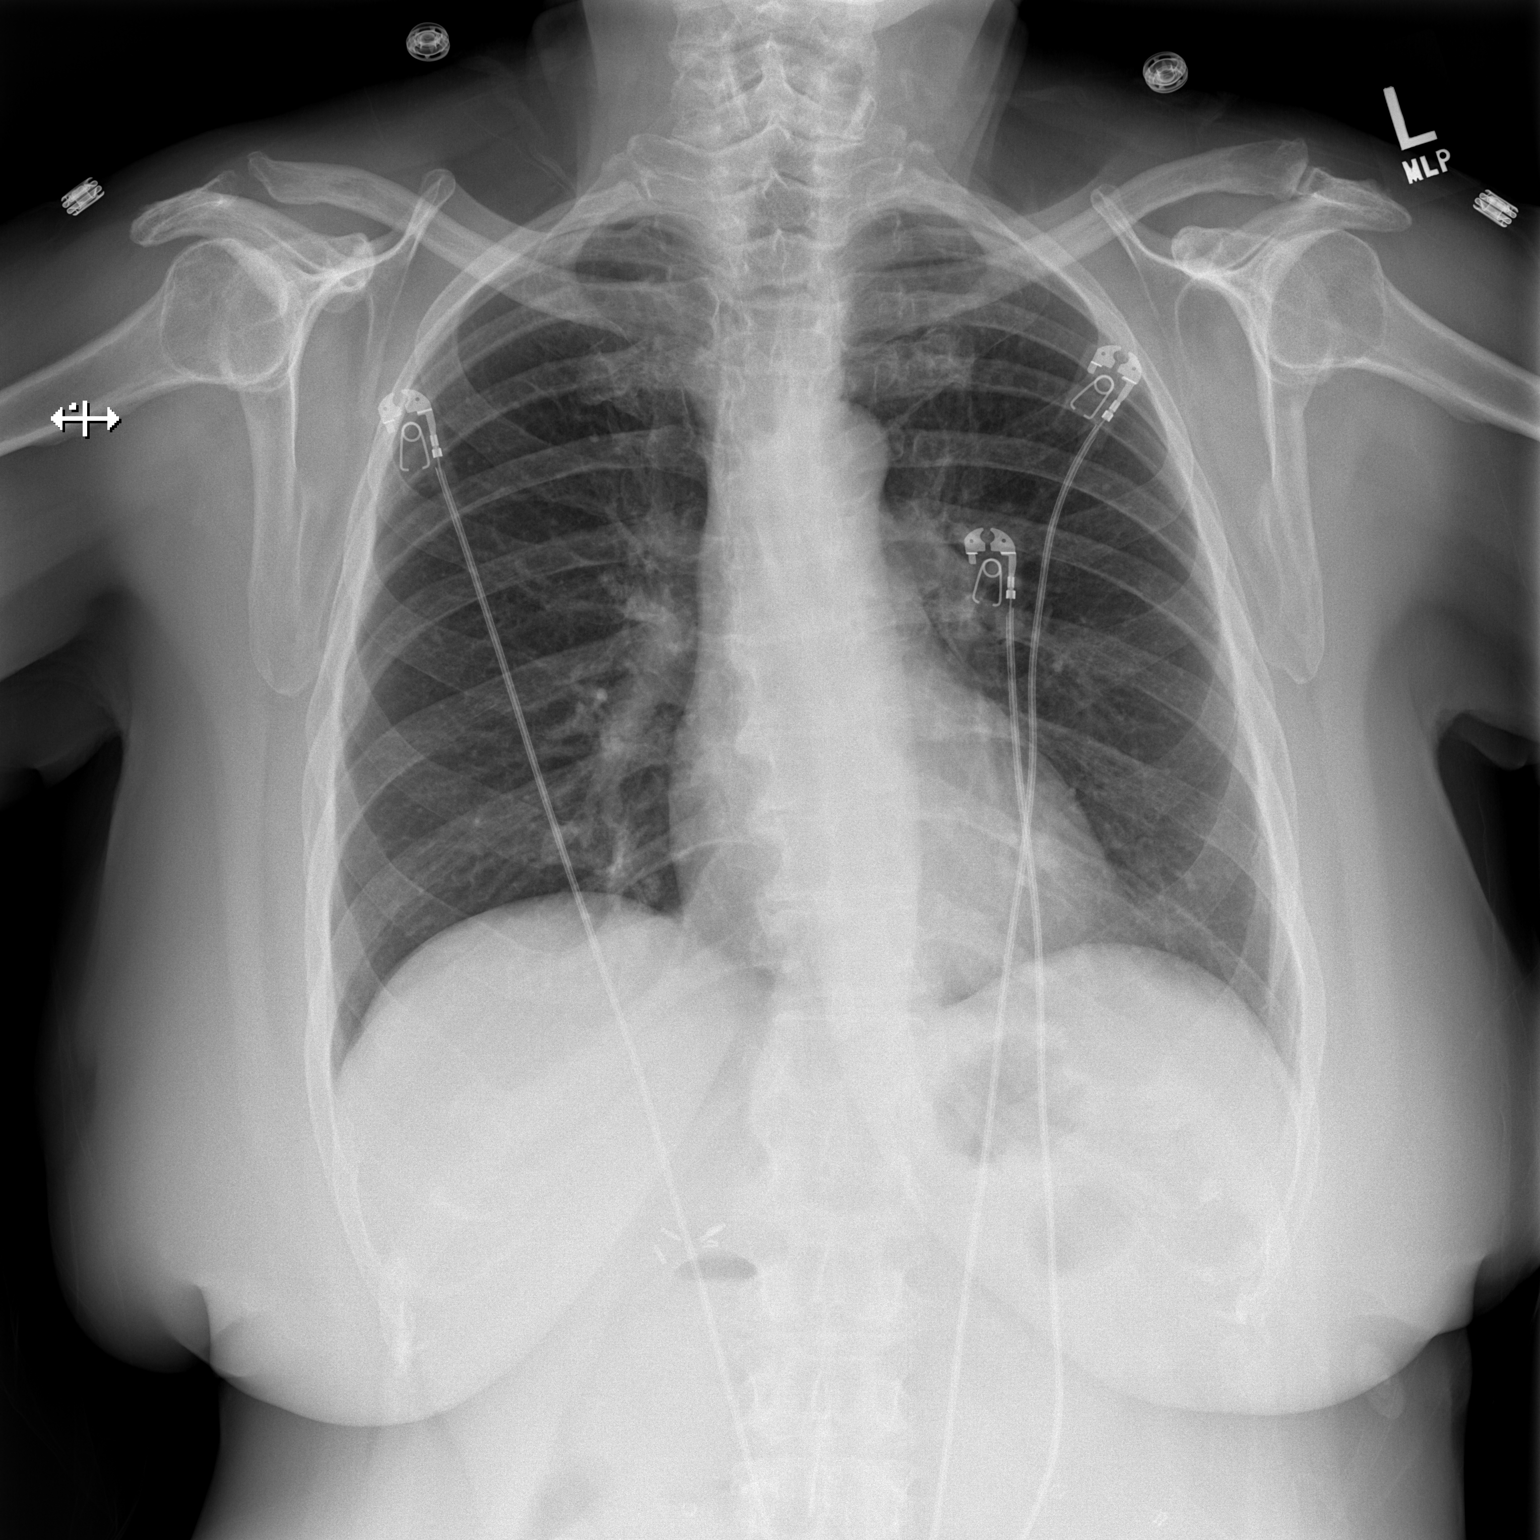

[w chest lat]
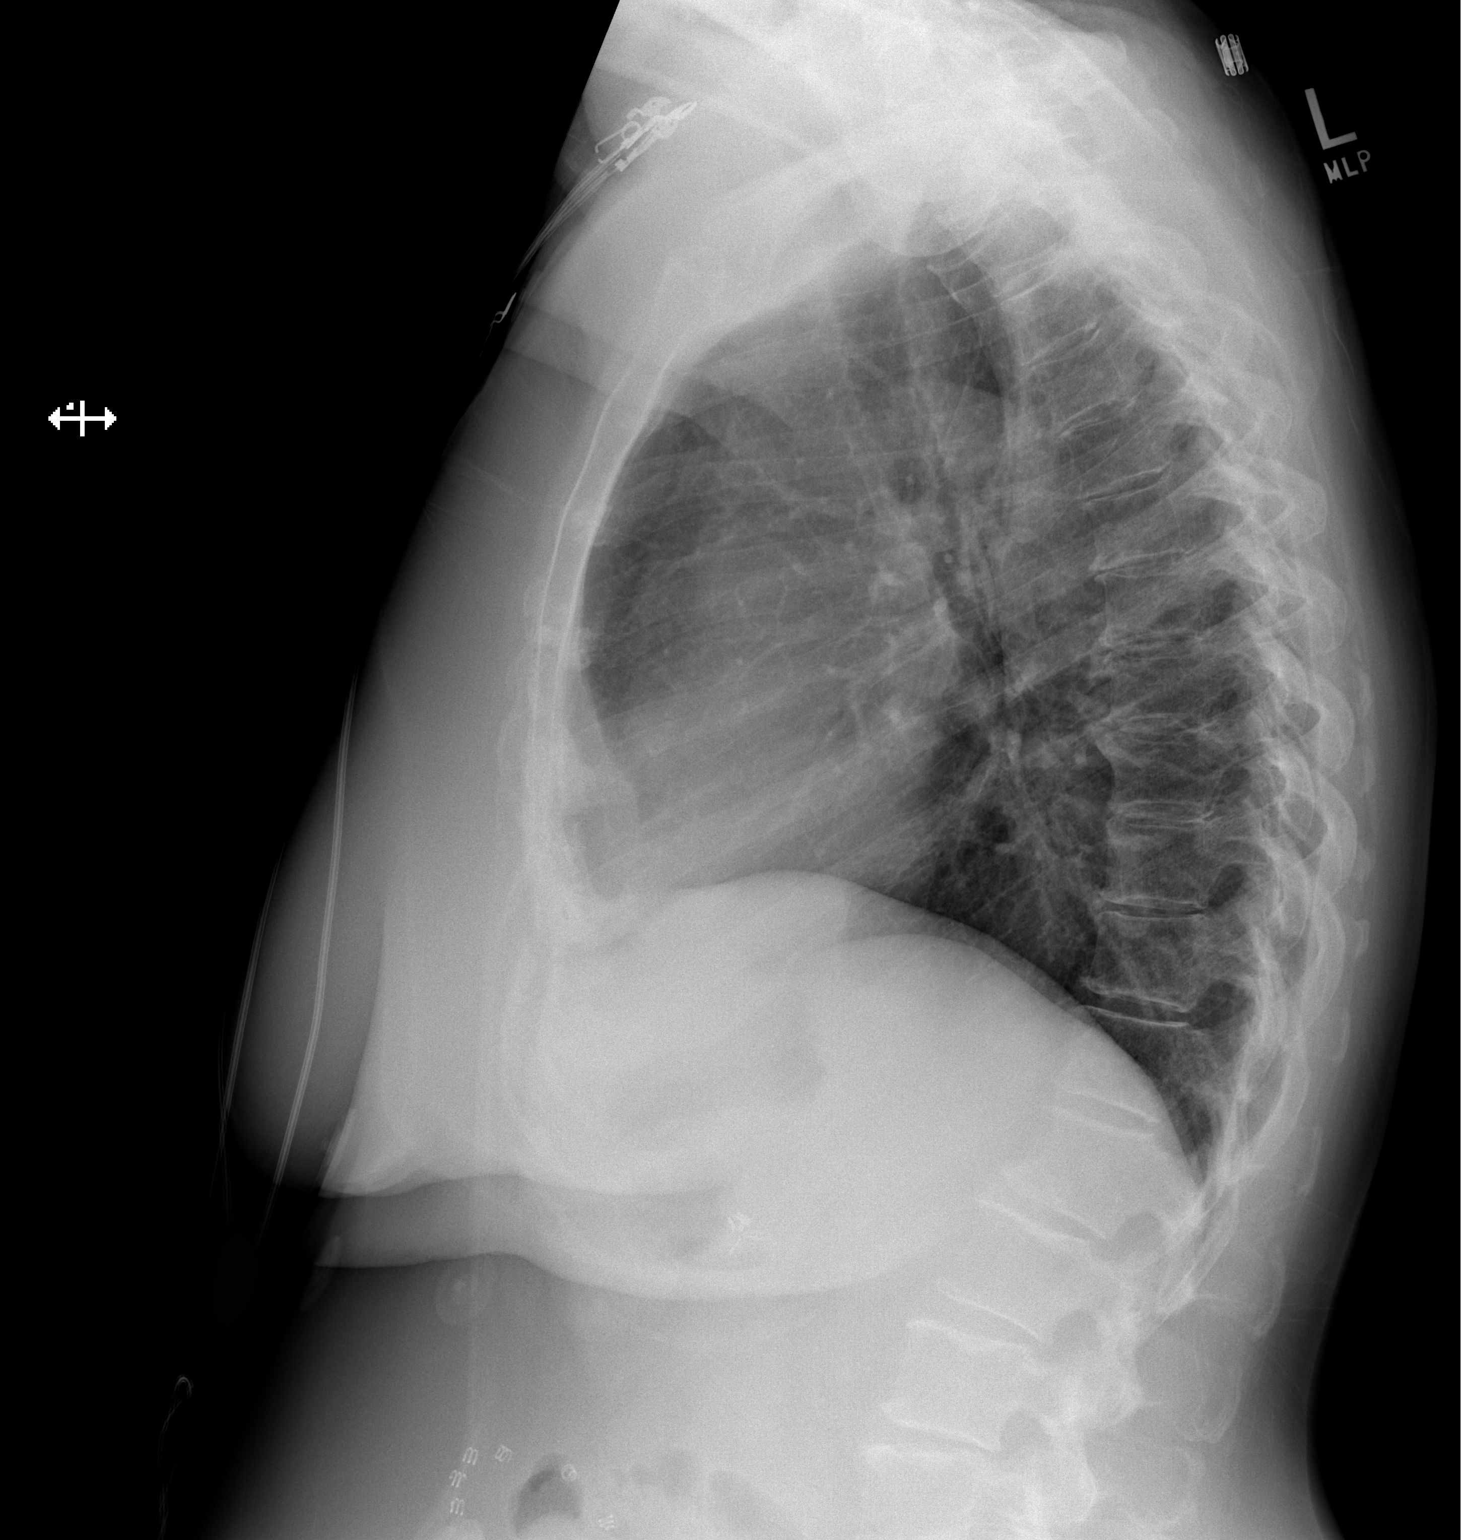

[2 of 2 positions shown; findings below may reference images not displayed]

FINDINGS: The heart size and mediastinal contours are within normal limits.
Both lungs are clear. The visualized skeletal structures are
unremarkable.
IMPRESSION: No active cardiopulmonary disease.

## 2019-05-14 DIAGNOSIS — H52223 Regular astigmatism, bilateral: Secondary | ICD-10-CM | POA: Diagnosis not present

## 2019-05-22 DIAGNOSIS — I10 Essential (primary) hypertension: Secondary | ICD-10-CM | POA: Diagnosis not present

## 2019-05-22 DIAGNOSIS — E1140 Type 2 diabetes mellitus with diabetic neuropathy, unspecified: Secondary | ICD-10-CM | POA: Diagnosis not present

## 2019-05-22 DIAGNOSIS — E7800 Pure hypercholesterolemia, unspecified: Secondary | ICD-10-CM | POA: Diagnosis not present

## 2019-07-03 DIAGNOSIS — I10 Essential (primary) hypertension: Secondary | ICD-10-CM | POA: Diagnosis not present

## 2019-07-03 DIAGNOSIS — M940 Chondrocostal junction syndrome [Tietze]: Secondary | ICD-10-CM | POA: Diagnosis not present

## 2019-07-03 DIAGNOSIS — E1140 Type 2 diabetes mellitus with diabetic neuropathy, unspecified: Secondary | ICD-10-CM | POA: Diagnosis not present

## 2019-07-03 DIAGNOSIS — E7800 Pure hypercholesterolemia, unspecified: Secondary | ICD-10-CM | POA: Diagnosis not present

## 2019-07-03 DIAGNOSIS — I700 Atherosclerosis of aorta: Secondary | ICD-10-CM | POA: Diagnosis not present

## 2019-07-03 DIAGNOSIS — R1030 Lower abdominal pain, unspecified: Secondary | ICD-10-CM | POA: Diagnosis not present

## 2019-08-06 DIAGNOSIS — R079 Chest pain, unspecified: Secondary | ICD-10-CM | POA: Diagnosis not present

## 2019-08-06 DIAGNOSIS — I351 Nonrheumatic aortic (valve) insufficiency: Secondary | ICD-10-CM | POA: Diagnosis not present

## 2019-08-06 DIAGNOSIS — E1140 Type 2 diabetes mellitus with diabetic neuropathy, unspecified: Secondary | ICD-10-CM | POA: Diagnosis not present

## 2019-08-06 DIAGNOSIS — I10 Essential (primary) hypertension: Secondary | ICD-10-CM | POA: Diagnosis not present

## 2019-08-06 DIAGNOSIS — H6982 Other specified disorders of Eustachian tube, left ear: Secondary | ICD-10-CM | POA: Diagnosis not present

## 2019-08-06 DIAGNOSIS — I700 Atherosclerosis of aorta: Secondary | ICD-10-CM | POA: Diagnosis not present

## 2019-08-06 DIAGNOSIS — Z7984 Long term (current) use of oral hypoglycemic drugs: Secondary | ICD-10-CM | POA: Diagnosis not present

## 2019-08-06 DIAGNOSIS — E7800 Pure hypercholesterolemia, unspecified: Secondary | ICD-10-CM | POA: Diagnosis not present

## 2019-08-09 DIAGNOSIS — R0600 Dyspnea, unspecified: Secondary | ICD-10-CM | POA: Diagnosis not present

## 2019-08-14 ENCOUNTER — Other Ambulatory Visit: Payer: Self-pay

## 2019-08-14 DIAGNOSIS — R079 Chest pain, unspecified: Secondary | ICD-10-CM

## 2019-08-14 NOTE — Progress Notes (Signed)
Patient of Dr. Felipa Eth

## 2019-08-24 ENCOUNTER — Encounter (HOSPITAL_COMMUNITY): Payer: Self-pay

## 2019-08-25 IMAGING — DX DG KNEE 1-2V PORT*R*
1 series · 2 of 2 positions shown · non-contrast
Comparison: None.

CLINICAL DATA: Status post right knee replacement

EXAM:
PORTABLE RIGHT KNEE - 2 VIEW

[Series 1: knee · 0.14mm/px · 2 of 2 slices shown]
[im 1/2]
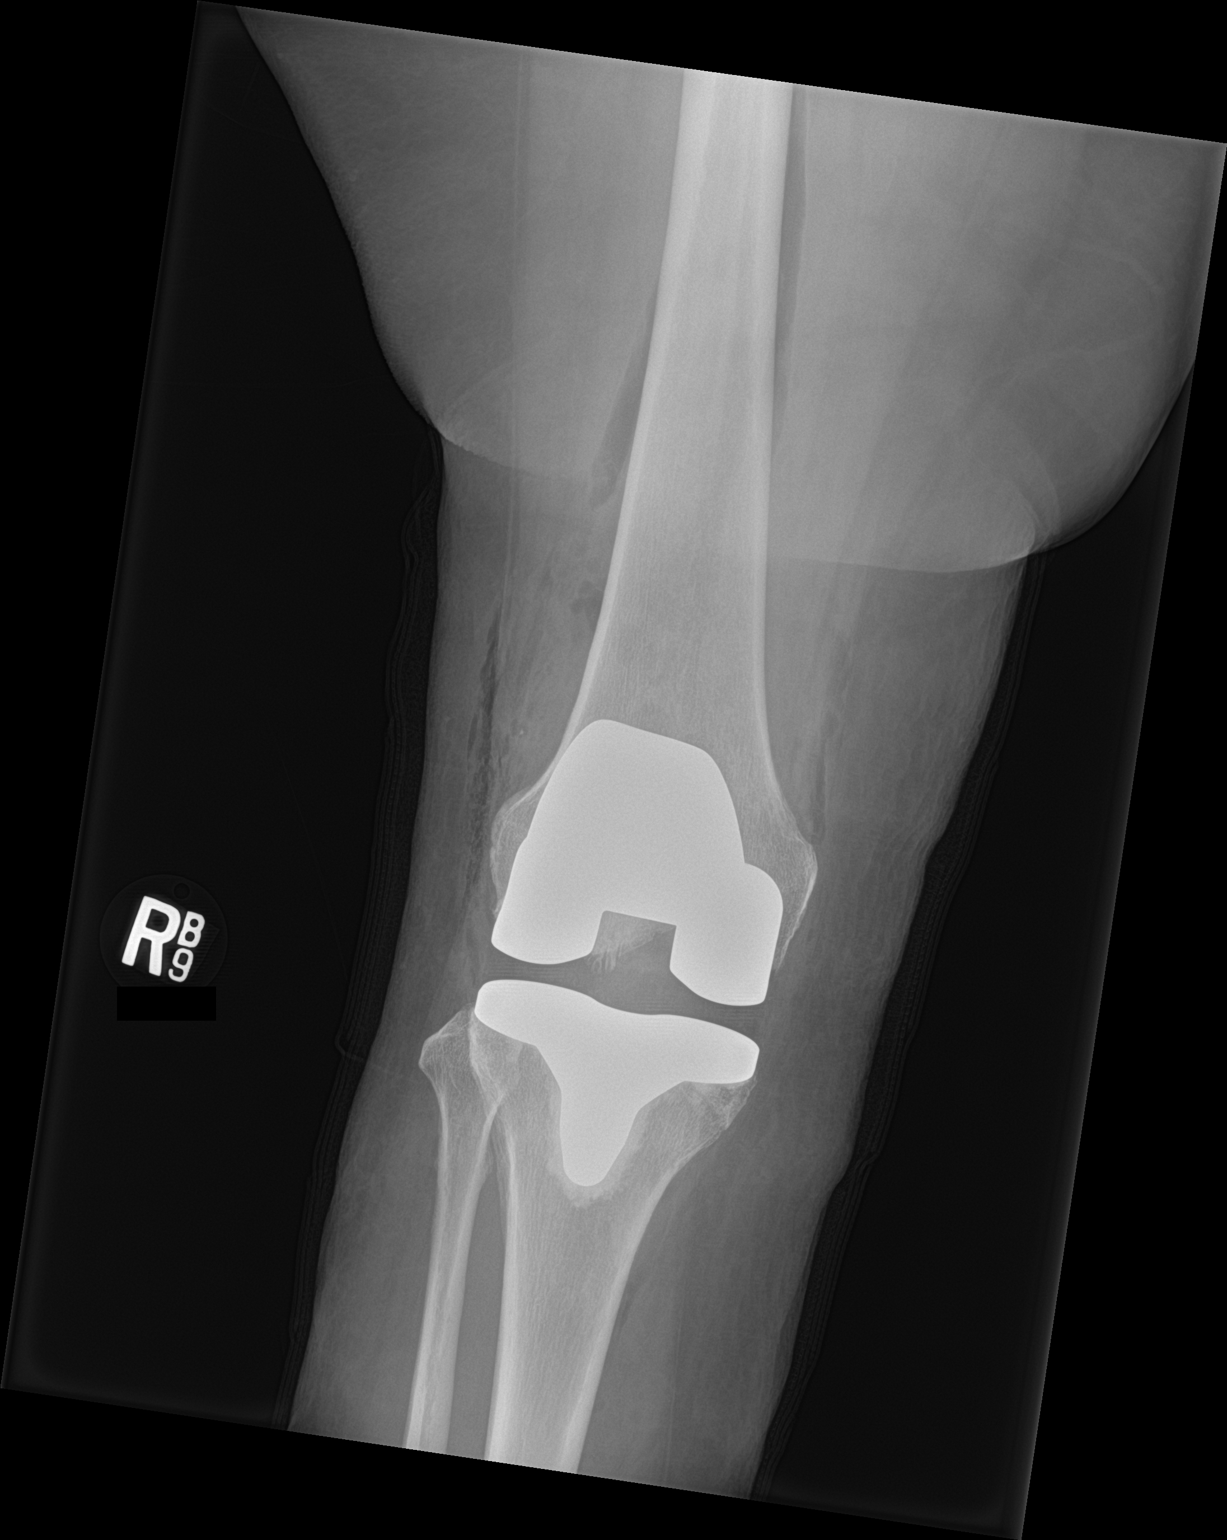
[im 2/2]
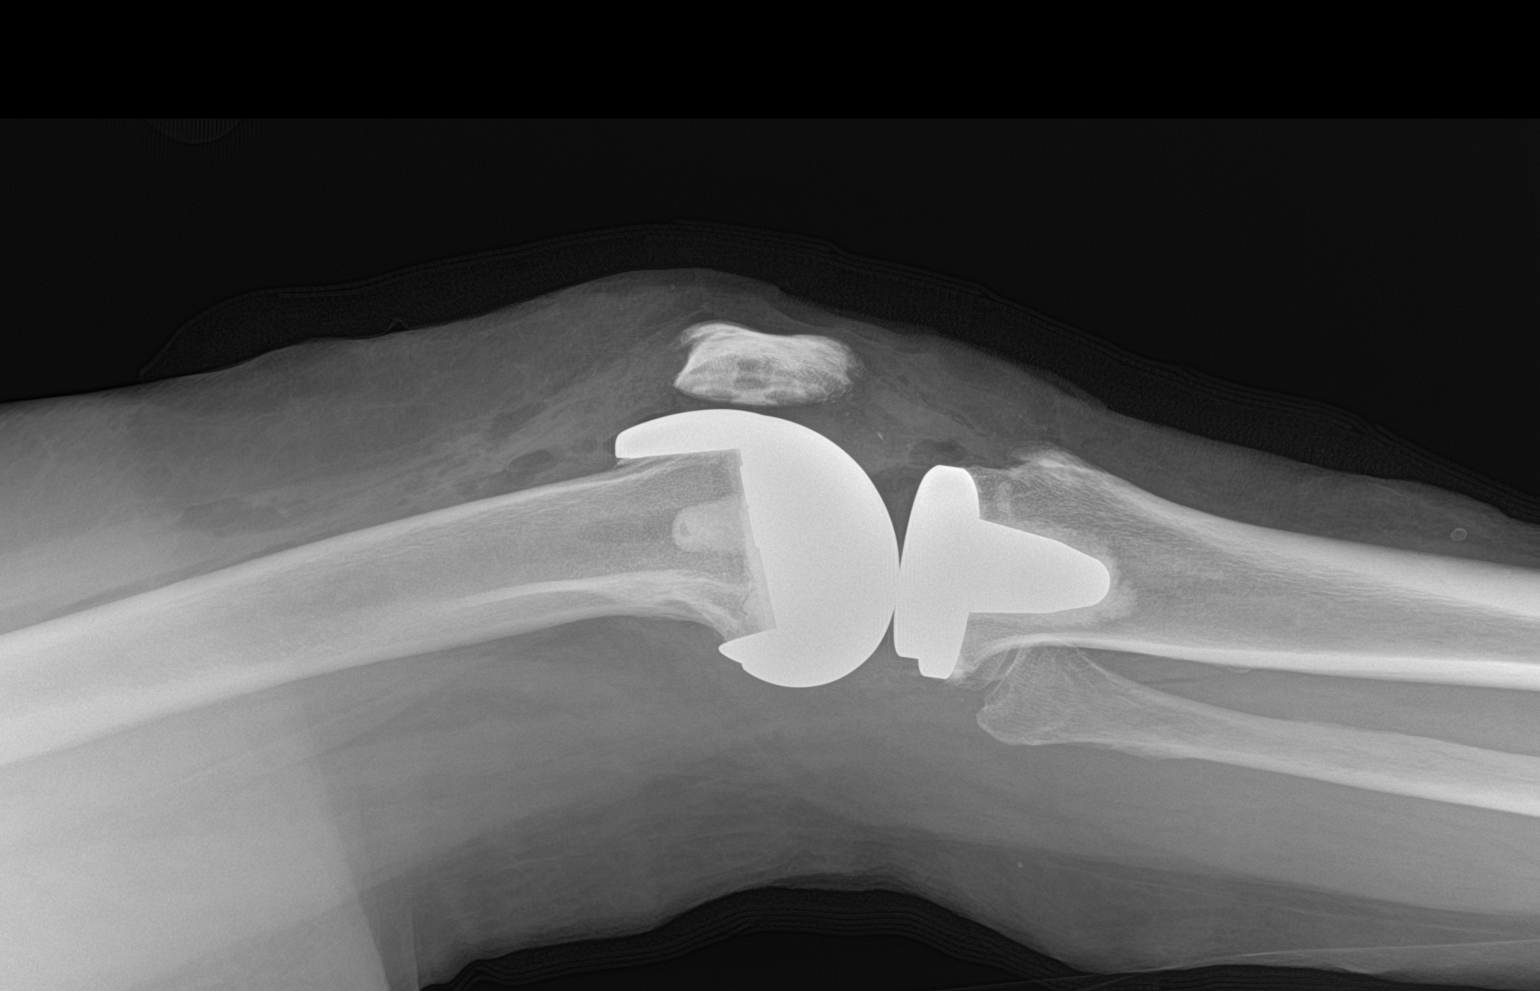

[2 of 2 positions shown; findings below may reference images not displayed]

FINDINGS: Right knee prosthesis is noted in satisfactory position. No acute
fracture or dislocation is noted. No soft tissue abnormality is
seen.
IMPRESSION: Status post right knee replacement.

## 2019-08-27 ENCOUNTER — Other Ambulatory Visit: Payer: Self-pay

## 2019-08-27 ENCOUNTER — Ambulatory Visit (HOSPITAL_COMMUNITY)
Admission: RE | Admit: 2019-08-27 | Discharge: 2019-08-27 | Disposition: A | Discharge status: Home / Self Care | Payer: Medicare HMO | Source: Ambulatory Visit | Attending: Cardiology | Admitting: Cardiology

## 2019-08-27 DIAGNOSIS — R918 Other nonspecific abnormal finding of lung field: Secondary | ICD-10-CM | POA: Diagnosis not present

## 2019-08-27 DIAGNOSIS — I700 Atherosclerosis of aorta: Secondary | ICD-10-CM | POA: Diagnosis not present

## 2019-08-27 DIAGNOSIS — R079 Chest pain, unspecified: Secondary | ICD-10-CM | POA: Diagnosis not present

## 2019-08-27 LAB — GLUCOSE, CAPILLARY
Glucose-Capillary: 67 mg/dL — ABNORMAL LOW (ref 70–99)
Glucose-Capillary: 98 mg/dL (ref 70–99)

## 2019-08-27 LAB — POCT I-STAT CREATININE: Creatinine, Ser: 0.6 mg/dL (ref 0.44–1.00)

## 2019-08-27 MED ORDER — METOPROLOL TARTRATE 5 MG/5ML IV SOLN
INTRAVENOUS | Status: AC
  Filled 2019-08-27: qty 15

## 2019-08-27 MED ORDER — NITROGLYCERIN 0.4 MG SL SUBL: 0.8000 mg | SUBLINGUAL_TABLET | Freq: Once | SUBLINGUAL | Status: AC

## 2019-08-27 MED ORDER — NITROGLYCERIN 0.4 MG SL SUBL
SUBLINGUAL_TABLET | SUBLINGUAL | Status: AC
  Administered 2019-08-27: 0.8 mg via SUBLINGUAL
  Filled 2019-08-27: qty 2

## 2019-08-27 MED ORDER — IOHEXOL 350 MG/ML SOLN
80.0000 mL | Freq: Once | INTRAVENOUS | Status: AC | PRN
  Administered 2019-08-27: 80 mL via INTRAVENOUS

## 2019-08-27 MED ORDER — METOPROLOL TARTRATE 5 MG/5ML IV SOLN
5.0000 mg | INTRAVENOUS | Status: DC | PRN
  Administered 2019-08-27: 5 mg via INTRAVENOUS

## 2019-08-27 NOTE — Progress Notes (Signed)
CBG repeated post eating cookies and drinking ginger ale, resulted at 96. Feels much better although has mild headache. Know this is from the nitro she was given.Discharged in w/c to be driven home by family. VS stable.

## 2019-08-27 NOTE — Progress Notes (Signed)
Feels jittery like her blood sugar is low. CBG 67. Given ginger ale and short bread cookies.

## 2019-09-03 DIAGNOSIS — E1140 Type 2 diabetes mellitus with diabetic neuropathy, unspecified: Secondary | ICD-10-CM | POA: Diagnosis not present

## 2019-09-03 DIAGNOSIS — L989 Disorder of the skin and subcutaneous tissue, unspecified: Secondary | ICD-10-CM | POA: Diagnosis not present

## 2019-09-03 DIAGNOSIS — J301 Allergic rhinitis due to pollen: Secondary | ICD-10-CM | POA: Diagnosis not present

## 2019-09-03 DIAGNOSIS — I10 Essential (primary) hypertension: Secondary | ICD-10-CM | POA: Diagnosis not present

## 2019-10-02 DIAGNOSIS — Z79899 Other long term (current) drug therapy: Secondary | ICD-10-CM | POA: Diagnosis not present

## 2019-10-02 DIAGNOSIS — J301 Allergic rhinitis due to pollen: Secondary | ICD-10-CM | POA: Diagnosis not present

## 2019-10-02 DIAGNOSIS — R5383 Other fatigue: Secondary | ICD-10-CM | POA: Diagnosis not present

## 2019-10-02 DIAGNOSIS — I10 Essential (primary) hypertension: Secondary | ICD-10-CM | POA: Diagnosis not present

## 2019-10-23 DIAGNOSIS — N390 Urinary tract infection, site not specified: Secondary | ICD-10-CM | POA: Diagnosis not present

## 2019-11-07 DIAGNOSIS — Z1231 Encounter for screening mammogram for malignant neoplasm of breast: Secondary | ICD-10-CM | POA: Diagnosis not present

## 2019-12-15 DIAGNOSIS — J309 Allergic rhinitis, unspecified: Secondary | ICD-10-CM | POA: Diagnosis not present

## 2019-12-15 DIAGNOSIS — H1010 Acute atopic conjunctivitis, unspecified eye: Secondary | ICD-10-CM | POA: Diagnosis not present

## 2019-12-15 DIAGNOSIS — J0100 Acute maxillary sinusitis, unspecified: Secondary | ICD-10-CM | POA: Diagnosis not present

## 2019-12-17 DIAGNOSIS — H26493 Other secondary cataract, bilateral: Secondary | ICD-10-CM | POA: Diagnosis not present

## 2019-12-17 DIAGNOSIS — H401131 Primary open-angle glaucoma, bilateral, mild stage: Secondary | ICD-10-CM | POA: Diagnosis not present

## 2019-12-17 DIAGNOSIS — H02831 Dermatochalasis of right upper eyelid: Secondary | ICD-10-CM | POA: Diagnosis not present

## 2019-12-17 DIAGNOSIS — H02834 Dermatochalasis of left upper eyelid: Secondary | ICD-10-CM | POA: Diagnosis not present

## 2020-01-04 DIAGNOSIS — I10 Essential (primary) hypertension: Secondary | ICD-10-CM | POA: Diagnosis not present

## 2020-01-04 DIAGNOSIS — H9312 Tinnitus, left ear: Secondary | ICD-10-CM | POA: Diagnosis not present

## 2020-01-04 DIAGNOSIS — R110 Nausea: Secondary | ICD-10-CM | POA: Diagnosis not present

## 2020-01-04 DIAGNOSIS — H9202 Otalgia, left ear: Secondary | ICD-10-CM | POA: Diagnosis not present

## 2020-01-04 DIAGNOSIS — D509 Iron deficiency anemia, unspecified: Secondary | ICD-10-CM | POA: Diagnosis not present

## 2020-01-10 DIAGNOSIS — E1140 Type 2 diabetes mellitus with diabetic neuropathy, unspecified: Secondary | ICD-10-CM | POA: Diagnosis not present

## 2020-01-10 DIAGNOSIS — I10 Essential (primary) hypertension: Secondary | ICD-10-CM | POA: Diagnosis not present

## 2020-01-10 DIAGNOSIS — D509 Iron deficiency anemia, unspecified: Secondary | ICD-10-CM | POA: Diagnosis not present

## 2020-01-10 DIAGNOSIS — E7800 Pure hypercholesterolemia, unspecified: Secondary | ICD-10-CM | POA: Diagnosis not present

## 2020-01-15 DIAGNOSIS — H9202 Otalgia, left ear: Secondary | ICD-10-CM | POA: Diagnosis not present

## 2020-01-15 DIAGNOSIS — R0982 Postnasal drip: Secondary | ICD-10-CM | POA: Diagnosis not present

## 2020-01-15 DIAGNOSIS — H6123 Impacted cerumen, bilateral: Secondary | ICD-10-CM | POA: Diagnosis not present

## 2020-01-15 DIAGNOSIS — H903 Sensorineural hearing loss, bilateral: Secondary | ICD-10-CM | POA: Diagnosis not present

## 2020-02-11 DIAGNOSIS — I10 Essential (primary) hypertension: Secondary | ICD-10-CM | POA: Diagnosis not present

## 2020-02-11 DIAGNOSIS — J302 Other seasonal allergic rhinitis: Secondary | ICD-10-CM | POA: Diagnosis not present

## 2020-02-25 ENCOUNTER — Other Ambulatory Visit: Payer: Self-pay | Admitting: Gastroenterology

## 2020-02-25 DIAGNOSIS — R1084 Generalized abdominal pain: Secondary | ICD-10-CM

## 2020-02-25 DIAGNOSIS — R634 Abnormal weight loss: Secondary | ICD-10-CM | POA: Diagnosis not present

## 2020-02-25 DIAGNOSIS — Z86010 Personal history of colonic polyps: Secondary | ICD-10-CM | POA: Diagnosis not present

## 2020-02-25 DIAGNOSIS — D509 Iron deficiency anemia, unspecified: Secondary | ICD-10-CM | POA: Diagnosis not present

## 2020-02-25 DIAGNOSIS — R195 Other fecal abnormalities: Secondary | ICD-10-CM | POA: Diagnosis not present

## 2020-02-26 DIAGNOSIS — R1084 Generalized abdominal pain: Secondary | ICD-10-CM | POA: Diagnosis not present

## 2020-03-10 ENCOUNTER — Ambulatory Visit
Admission: RE | Admit: 2020-03-10 | Discharge: 2020-03-10 | Disposition: A | Discharge status: Home / Self Care | Payer: Medicare HMO | Source: Ambulatory Visit | Attending: Gastroenterology | Admitting: Gastroenterology

## 2020-03-10 DIAGNOSIS — N281 Cyst of kidney, acquired: Secondary | ICD-10-CM | POA: Diagnosis not present

## 2020-03-10 DIAGNOSIS — I700 Atherosclerosis of aorta: Secondary | ICD-10-CM | POA: Diagnosis not present

## 2020-03-10 DIAGNOSIS — J341 Cyst and mucocele of nose and nasal sinus: Secondary | ICD-10-CM | POA: Diagnosis not present

## 2020-03-10 DIAGNOSIS — R1084 Generalized abdominal pain: Secondary | ICD-10-CM

## 2020-03-10 DIAGNOSIS — I517 Cardiomegaly: Secondary | ICD-10-CM | POA: Diagnosis not present

## 2020-03-10 DIAGNOSIS — R634 Abnormal weight loss: Secondary | ICD-10-CM

## 2020-03-10 MED ORDER — IOPAMIDOL (ISOVUE-300) INJECTION 61%
100.0000 mL | Freq: Once | INTRAVENOUS | Status: AC | PRN
  Administered 2020-03-10: 100 mL via INTRAVENOUS

## 2020-03-13 DIAGNOSIS — E7800 Pure hypercholesterolemia, unspecified: Secondary | ICD-10-CM | POA: Diagnosis not present

## 2020-03-13 DIAGNOSIS — Z23 Encounter for immunization: Secondary | ICD-10-CM | POA: Diagnosis not present

## 2020-03-13 DIAGNOSIS — Z79899 Other long term (current) drug therapy: Secondary | ICD-10-CM | POA: Diagnosis not present

## 2020-03-13 DIAGNOSIS — I700 Atherosclerosis of aorta: Secondary | ICD-10-CM | POA: Diagnosis not present

## 2020-03-13 DIAGNOSIS — I351 Nonrheumatic aortic (valve) insufficiency: Secondary | ICD-10-CM | POA: Diagnosis not present

## 2020-03-13 DIAGNOSIS — Z0000 Encounter for general adult medical examination without abnormal findings: Secondary | ICD-10-CM | POA: Diagnosis not present

## 2020-03-13 DIAGNOSIS — Z7984 Long term (current) use of oral hypoglycemic drugs: Secondary | ICD-10-CM | POA: Diagnosis not present

## 2020-03-13 DIAGNOSIS — E1140 Type 2 diabetes mellitus with diabetic neuropathy, unspecified: Secondary | ICD-10-CM | POA: Diagnosis not present

## 2020-03-13 DIAGNOSIS — I10 Essential (primary) hypertension: Secondary | ICD-10-CM | POA: Diagnosis not present

## 2020-03-13 DIAGNOSIS — Z1389 Encounter for screening for other disorder: Secondary | ICD-10-CM | POA: Diagnosis not present

## 2020-03-17 DIAGNOSIS — R109 Unspecified abdominal pain: Secondary | ICD-10-CM | POA: Diagnosis not present

## 2020-03-27 DIAGNOSIS — E1140 Type 2 diabetes mellitus with diabetic neuropathy, unspecified: Secondary | ICD-10-CM | POA: Diagnosis not present

## 2020-03-27 DIAGNOSIS — D509 Iron deficiency anemia, unspecified: Secondary | ICD-10-CM | POA: Diagnosis not present

## 2020-03-27 DIAGNOSIS — I10 Essential (primary) hypertension: Secondary | ICD-10-CM | POA: Diagnosis not present

## 2020-03-27 DIAGNOSIS — E7800 Pure hypercholesterolemia, unspecified: Secondary | ICD-10-CM | POA: Diagnosis not present

## 2020-04-08 DIAGNOSIS — E119 Type 2 diabetes mellitus without complications: Secondary | ICD-10-CM | POA: Diagnosis not present

## 2020-04-08 DIAGNOSIS — E1140 Type 2 diabetes mellitus with diabetic neuropathy, unspecified: Secondary | ICD-10-CM | POA: Diagnosis not present

## 2020-04-08 DIAGNOSIS — D509 Iron deficiency anemia, unspecified: Secondary | ICD-10-CM | POA: Diagnosis not present

## 2020-04-08 DIAGNOSIS — I10 Essential (primary) hypertension: Secondary | ICD-10-CM | POA: Diagnosis not present

## 2020-04-08 DIAGNOSIS — E7800 Pure hypercholesterolemia, unspecified: Secondary | ICD-10-CM | POA: Diagnosis not present

## 2020-05-24 DIAGNOSIS — E1140 Type 2 diabetes mellitus with diabetic neuropathy, unspecified: Secondary | ICD-10-CM | POA: Diagnosis not present

## 2020-05-24 DIAGNOSIS — E7800 Pure hypercholesterolemia, unspecified: Secondary | ICD-10-CM | POA: Diagnosis not present

## 2020-05-24 DIAGNOSIS — I10 Essential (primary) hypertension: Secondary | ICD-10-CM | POA: Diagnosis not present

## 2020-05-24 DIAGNOSIS — E119 Type 2 diabetes mellitus without complications: Secondary | ICD-10-CM | POA: Diagnosis not present

## 2020-05-24 DIAGNOSIS — D509 Iron deficiency anemia, unspecified: Secondary | ICD-10-CM | POA: Diagnosis not present

## 2020-06-18 DIAGNOSIS — H5200 Hypermetropia, unspecified eye: Secondary | ICD-10-CM | POA: Diagnosis not present

## 2020-06-20 DIAGNOSIS — E1140 Type 2 diabetes mellitus with diabetic neuropathy, unspecified: Secondary | ICD-10-CM | POA: Diagnosis not present

## 2020-06-20 DIAGNOSIS — I10 Essential (primary) hypertension: Secondary | ICD-10-CM | POA: Diagnosis not present

## 2020-06-20 DIAGNOSIS — D509 Iron deficiency anemia, unspecified: Secondary | ICD-10-CM | POA: Diagnosis not present

## 2020-06-20 DIAGNOSIS — E119 Type 2 diabetes mellitus without complications: Secondary | ICD-10-CM | POA: Diagnosis not present

## 2020-06-20 DIAGNOSIS — E7800 Pure hypercholesterolemia, unspecified: Secondary | ICD-10-CM | POA: Diagnosis not present

## 2020-07-18 DIAGNOSIS — E1140 Type 2 diabetes mellitus with diabetic neuropathy, unspecified: Secondary | ICD-10-CM | POA: Diagnosis not present

## 2020-07-18 DIAGNOSIS — E119 Type 2 diabetes mellitus without complications: Secondary | ICD-10-CM | POA: Diagnosis not present

## 2020-07-18 DIAGNOSIS — I10 Essential (primary) hypertension: Secondary | ICD-10-CM | POA: Diagnosis not present

## 2020-07-18 DIAGNOSIS — D509 Iron deficiency anemia, unspecified: Secondary | ICD-10-CM | POA: Diagnosis not present

## 2020-07-18 DIAGNOSIS — E7800 Pure hypercholesterolemia, unspecified: Secondary | ICD-10-CM | POA: Diagnosis not present

## 2020-08-15 DIAGNOSIS — E7800 Pure hypercholesterolemia, unspecified: Secondary | ICD-10-CM | POA: Diagnosis not present

## 2020-08-15 DIAGNOSIS — E1140 Type 2 diabetes mellitus with diabetic neuropathy, unspecified: Secondary | ICD-10-CM | POA: Diagnosis not present

## 2020-08-15 DIAGNOSIS — I10 Essential (primary) hypertension: Secondary | ICD-10-CM | POA: Diagnosis not present

## 2020-08-15 DIAGNOSIS — D509 Iron deficiency anemia, unspecified: Secondary | ICD-10-CM | POA: Diagnosis not present

## 2020-08-15 DIAGNOSIS — E119 Type 2 diabetes mellitus without complications: Secondary | ICD-10-CM | POA: Diagnosis not present

## 2020-09-09 ENCOUNTER — Other Ambulatory Visit: Payer: Self-pay | Admitting: Geriatric Medicine

## 2020-09-09 DIAGNOSIS — R911 Solitary pulmonary nodule: Secondary | ICD-10-CM

## 2020-09-09 DIAGNOSIS — IMO0001 Reserved for inherently not codable concepts without codable children: Secondary | ICD-10-CM

## 2020-09-11 DIAGNOSIS — D509 Iron deficiency anemia, unspecified: Secondary | ICD-10-CM | POA: Diagnosis not present

## 2020-09-11 DIAGNOSIS — E7800 Pure hypercholesterolemia, unspecified: Secondary | ICD-10-CM | POA: Diagnosis not present

## 2020-09-11 DIAGNOSIS — I10 Essential (primary) hypertension: Secondary | ICD-10-CM | POA: Diagnosis not present

## 2020-09-11 DIAGNOSIS — M5431 Sciatica, right side: Secondary | ICD-10-CM | POA: Diagnosis not present

## 2020-09-11 DIAGNOSIS — E1140 Type 2 diabetes mellitus with diabetic neuropathy, unspecified: Secondary | ICD-10-CM | POA: Diagnosis not present

## 2020-09-11 DIAGNOSIS — I700 Atherosclerosis of aorta: Secondary | ICD-10-CM | POA: Diagnosis not present

## 2020-09-16 DIAGNOSIS — R1032 Left lower quadrant pain: Secondary | ICD-10-CM | POA: Diagnosis not present

## 2020-09-22 DIAGNOSIS — E1140 Type 2 diabetes mellitus with diabetic neuropathy, unspecified: Secondary | ICD-10-CM | POA: Diagnosis not present

## 2020-09-22 DIAGNOSIS — R197 Diarrhea, unspecified: Secondary | ICD-10-CM | POA: Diagnosis not present

## 2020-09-22 DIAGNOSIS — I10 Essential (primary) hypertension: Secondary | ICD-10-CM | POA: Diagnosis not present

## 2020-09-25 ENCOUNTER — Other Ambulatory Visit: Payer: Self-pay

## 2020-09-25 ENCOUNTER — Ambulatory Visit
Admission: RE | Admit: 2020-09-25 | Discharge: 2020-09-25 | Disposition: A | Discharge status: Home / Self Care | Payer: Medicare HMO | Source: Ambulatory Visit | Attending: Geriatric Medicine | Admitting: Geriatric Medicine

## 2020-09-25 DIAGNOSIS — D509 Iron deficiency anemia, unspecified: Secondary | ICD-10-CM | POA: Diagnosis not present

## 2020-09-25 DIAGNOSIS — R918 Other nonspecific abnormal finding of lung field: Secondary | ICD-10-CM | POA: Diagnosis not present

## 2020-09-25 DIAGNOSIS — R911 Solitary pulmonary nodule: Secondary | ICD-10-CM

## 2020-09-25 DIAGNOSIS — I10 Essential (primary) hypertension: Secondary | ICD-10-CM | POA: Diagnosis not present

## 2020-09-25 DIAGNOSIS — E7800 Pure hypercholesterolemia, unspecified: Secondary | ICD-10-CM | POA: Diagnosis not present

## 2020-09-25 DIAGNOSIS — E1140 Type 2 diabetes mellitus with diabetic neuropathy, unspecified: Secondary | ICD-10-CM | POA: Diagnosis not present

## 2020-09-25 DIAGNOSIS — IMO0001 Reserved for inherently not codable concepts without codable children: Secondary | ICD-10-CM

## 2020-10-02 DIAGNOSIS — H02834 Dermatochalasis of left upper eyelid: Secondary | ICD-10-CM | POA: Diagnosis not present

## 2020-10-02 DIAGNOSIS — H26492 Other secondary cataract, left eye: Secondary | ICD-10-CM | POA: Diagnosis not present

## 2020-10-02 DIAGNOSIS — H02831 Dermatochalasis of right upper eyelid: Secondary | ICD-10-CM | POA: Diagnosis not present

## 2020-10-02 DIAGNOSIS — H401131 Primary open-angle glaucoma, bilateral, mild stage: Secondary | ICD-10-CM | POA: Diagnosis not present

## 2020-10-03 DIAGNOSIS — I10 Essential (primary) hypertension: Secondary | ICD-10-CM | POA: Diagnosis not present

## 2020-10-03 DIAGNOSIS — E1140 Type 2 diabetes mellitus with diabetic neuropathy, unspecified: Secondary | ICD-10-CM | POA: Diagnosis not present

## 2020-10-03 DIAGNOSIS — E7800 Pure hypercholesterolemia, unspecified: Secondary | ICD-10-CM | POA: Diagnosis not present

## 2020-10-03 DIAGNOSIS — D509 Iron deficiency anemia, unspecified: Secondary | ICD-10-CM | POA: Diagnosis not present

## 2020-10-13 DIAGNOSIS — E1140 Type 2 diabetes mellitus with diabetic neuropathy, unspecified: Secondary | ICD-10-CM | POA: Diagnosis not present

## 2020-10-13 DIAGNOSIS — J309 Allergic rhinitis, unspecified: Secondary | ICD-10-CM | POA: Diagnosis not present

## 2020-10-13 DIAGNOSIS — R000 Tachycardia, unspecified: Secondary | ICD-10-CM | POA: Diagnosis not present

## 2020-10-13 DIAGNOSIS — H353131 Nonexudative age-related macular degeneration, bilateral, early dry stage: Secondary | ICD-10-CM | POA: Diagnosis not present

## 2020-10-13 DIAGNOSIS — Z79899 Other long term (current) drug therapy: Secondary | ICD-10-CM | POA: Diagnosis not present

## 2020-10-13 DIAGNOSIS — I10 Essential (primary) hypertension: Secondary | ICD-10-CM | POA: Diagnosis not present

## 2020-11-07 DIAGNOSIS — Z1231 Encounter for screening mammogram for malignant neoplasm of breast: Secondary | ICD-10-CM | POA: Diagnosis not present

## 2020-11-12 DIAGNOSIS — E7800 Pure hypercholesterolemia, unspecified: Secondary | ICD-10-CM | POA: Diagnosis not present

## 2020-11-12 DIAGNOSIS — E1140 Type 2 diabetes mellitus with diabetic neuropathy, unspecified: Secondary | ICD-10-CM | POA: Diagnosis not present

## 2020-11-12 DIAGNOSIS — D509 Iron deficiency anemia, unspecified: Secondary | ICD-10-CM | POA: Diagnosis not present

## 2020-11-12 DIAGNOSIS — I10 Essential (primary) hypertension: Secondary | ICD-10-CM | POA: Diagnosis not present

## 2020-11-27 DIAGNOSIS — S161XXA Strain of muscle, fascia and tendon at neck level, initial encounter: Secondary | ICD-10-CM | POA: Diagnosis not present

## 2020-11-27 DIAGNOSIS — M5030 Other cervical disc degeneration, unspecified cervical region: Secondary | ICD-10-CM | POA: Diagnosis not present

## 2020-11-27 DIAGNOSIS — M7541 Impingement syndrome of right shoulder: Secondary | ICD-10-CM | POA: Diagnosis not present

## 2020-11-27 DIAGNOSIS — M542 Cervicalgia: Secondary | ICD-10-CM | POA: Diagnosis not present

## 2020-12-11 DIAGNOSIS — M542 Cervicalgia: Secondary | ICD-10-CM | POA: Diagnosis not present

## 2020-12-11 DIAGNOSIS — M7541 Impingement syndrome of right shoulder: Secondary | ICD-10-CM | POA: Diagnosis not present

## 2020-12-11 DIAGNOSIS — M25511 Pain in right shoulder: Secondary | ICD-10-CM | POA: Diagnosis not present

## 2020-12-16 ENCOUNTER — Other Ambulatory Visit: Payer: Self-pay

## 2020-12-16 ENCOUNTER — Ambulatory Visit (INDEPENDENT_AMBULATORY_CARE_PROVIDER_SITE_OTHER): Payer: Medicare HMO | Admitting: Allergy and Immunology

## 2020-12-16 VITALS — BP 142/64 | HR 63 | Temp 98.0°F | Resp 14 | Ht 61.0 in | Wt 143.0 lb

## 2020-12-16 DIAGNOSIS — M3500 Sicca syndrome, unspecified: Secondary | ICD-10-CM

## 2020-12-16 DIAGNOSIS — K219 Gastro-esophageal reflux disease without esophagitis: Secondary | ICD-10-CM

## 2020-12-16 DIAGNOSIS — Z79899 Other long term (current) drug therapy: Secondary | ICD-10-CM | POA: Diagnosis not present

## 2020-12-16 DIAGNOSIS — J3089 Other allergic rhinitis: Secondary | ICD-10-CM

## 2020-12-16 DIAGNOSIS — M25511 Pain in right shoulder: Secondary | ICD-10-CM | POA: Diagnosis not present

## 2020-12-16 DIAGNOSIS — M7541 Impingement syndrome of right shoulder: Secondary | ICD-10-CM | POA: Diagnosis not present

## 2020-12-16 DIAGNOSIS — M542 Cervicalgia: Secondary | ICD-10-CM | POA: Diagnosis not present

## 2020-12-16 MED ORDER — FAMOTIDINE 40 MG PO TABS: 40.0000 mg | ORAL_TABLET | Freq: Every day | ORAL | 5 refills | Status: DC

## 2020-12-16 MED ORDER — OLOPATADINE HCL 0.2 % OP SOLN: 1.0000 [drp] | OPHTHALMIC | 5 refills | Status: DC

## 2020-12-16 MED ORDER — BIOTENE DRY MOUTH GENTLE MT LIQD: 15.0000 mL | Freq: Three times a day (TID) | OROMUCOSAL | 5 refills | Status: DC | PRN

## 2020-12-16 MED ORDER — OMEPRAZOLE 40 MG PO CPDR: 40.0000 mg | DELAYED_RELEASE_CAPSULE | Freq: Two times a day (BID) | ORAL | 5 refills | Status: DC

## 2020-12-16 MED ORDER — MONTELUKAST SODIUM 10 MG PO TABS: 10.0000 mg | ORAL_TABLET | Freq: Every day | ORAL | 5 refills | Status: DC

## 2020-12-16 MED ORDER — LOSARTAN POTASSIUM 50 MG PO TABS: 50.0000 mg | ORAL_TABLET | Freq: Every day | ORAL | 5 refills | Status: DC

## 2020-12-16 MED ORDER — SYSTANE 0.4-0.3 % OP GEL: 1.0000 "application " | Freq: Every evening | OPHTHALMIC | 5 refills | Status: AC

## 2020-12-16 MED ORDER — SYSTANE COMPLETE 0.6 % OP SOLN: 1.0000 [drp] | Freq: Four times a day (QID) | OPHTHALMIC | 5 refills | Status: DC | PRN

## 2020-12-16 NOTE — Progress Notes (Signed)
[place] [facility] [place] [facility] [place] - [place] - [place]   Dear Dr. Felipa Eth,  Thank you for referring Kathleen Oliver to the [facility] on 12/16/2020.   Below is a summation of this patient's evaluation and recommendations.  Thank you for your referral. I will keep you informed about this patient's response to treatment.   If you have any questions please do not hesitate to contact me.   Sincerely,  Jiles Prows, MD Allergy / Immunology [place]   ______________________________________________________________________    NEW PATIENT NOTE  Referring Provider: [name], MD Primary Provider: [name], MD Date of office visit: 12/16/2020    Subjective:   Chief Complaint:  Kathleen Oliver (DOB: 20-Oct-1945) is a 75 y.o. female who presents to the clinic on 12/16/2020 with a chief complaint of Allergies .     HPI: Kathleen Oliver presents to this clinic in evaluation of drainage.  Her big issue is the fact that she has drainage in her throat.  She has this "wad" stuck in her throat and she is constantly throat clearing.  She wakes up at nighttime with a hacking cough and almost some choking trying to clear out her throat.  This has been going on for least 2 years if not longer.  She has tried multiple agents to clear out this issue including antihistamines and Flonase which did not help.  Flonase irritates her nose.  She can smell and taste without any problem.  She does not have a history of ugly nasal discharge.  She does have rather significant reflux that has required the administration of her proton pump inhibitor in the past.  For some reason she was taken off her proton pump inhibitor.  She does not have osteoporosis or kidney damage.  She has regurgitation with a sour taste in her mouth which is still an active issue even though she has been using Pepcid 1 or 2 times per day.  She  drinks 2 coffees in the morning and 2 teas in the afternoon and has chocolate 3 times per week and does not consume alcohol.  She does have some issues with intermittent sneezing and some itchy watery eyes.  She has been diagnosed with dry eye syndrome and has been given Restasis in the past.  When she wakes up in the morning her eyes are gritty.  She has a very dry mouth and she needs to wake up at nighttime to drink water to lubricate her mouth.  She is on lisinopril for high blood pressure.  She apparently has a history of "bronchitis".  Her bronchitis is manifested as a sore throat and then an unrelenting cough that occurs in spells with lots of mucus and gagging and posttussive urination.  She has these events 1 or 2 times per year but fortunately over the course of the past 2 years she has not had any lower respiratory tract symptoms.  She might have been diagnosed with asthma in the past and given an inhaler.  Past Medical History:  Diagnosis Date   Bronchial asthma    Chronic bronchitis ([place])    "numerous years; not q yr"    Complication of anesthesia 1999   "had 3 seizures & ended up in ICU days after I'd had anesthesia"    Diverticulosis of colon    Dysrhythmia    PVC's   GERD (gastroesophageal reflux disease)    History  of hiatal hernia    HTN (hypertension)    Hypercholesteremia    Hypertension    Obesity    Osteoarthrosis    Pneumonia 2000's X 1   Recurrent UTI    Seizures ([place]) 1999 X 3   "days after anesthesia"- no further issues   Stroke ([place])    /MRI from 1999;; showed I'd had had several small ones"-no residual effects.   Type II diabetes mellitus ([place])     Past Surgical History:  Procedure Laterality Date   ABDOMINAL HERNIA REPAIR  2001 X 2   APPENDECTOMY  ~ 2000   BIOPSY THYROID     BLADDER SUSPENSION  2000's   CARDIAC CATHETERIZATION  1990's? ; 11/2009   CARPAL TUNNEL RELEASE Bilateral ~ 2003   CATARACT EXTRACTION W/ INTRAOCULAR LENS  IMPLANT, BILATERAL  Bilateral ~ 2012   CHOLECYSTECTOMY OPEN  1984   COLECTOMY  ~ 2000   "perforated colon due to diverticulitis"   COLONOSCOPY WITH PROPOFOL N/A 02/18/2015   Procedure: COLONOSCOPY WITH PROPOFOL;  Surgeon: Garlan Fair, MD;  Location: [facility];  Service: Endoscopy;  Laterality: N/A;   COLOSTOMY  ~ [place]  ~ 2000   "only needed it 9 months"   ESOPHAGEAL DILATION  2000's X 1   HERNIA REPAIR     SHOULDER ARTHROSCOPY W/ ROTATOR CUFF REPAIR Right    x2 -scope '07   TONSILLECTOMY AND ADENOIDECTOMY  1950's   TOTAL ABDOMINAL HYSTERECTOMY  2004   TOTAL KNEE ARTHROPLASTY Right 02/24/2018   TOTAL KNEE ARTHROPLASTY Right 02/24/2018   Procedure: RIGHT TOTAL KNEE ARTHROPLASTY;  Surgeon: Netta Cedars, MD;  Location: [facility];  Service: Orthopedics;  Laterality: Right;   TRIGGER FINGER RELEASE Right 10/2009   "ring finger"   TRIGGER FINGER RELEASE Right 04/2014   "index & pinky"    Allergies as of 12/16/2020       Reactions   Cinobac [cinoxacin]    hives   Codeine Itching   Penicillins Hives   Has patient had a PCN reaction causing immediate rash, facial/tongue/throat swelling, SOB or lightheadedness with hypotension: Yes Has patient had a PCN reaction causing severe rash involving mucus membranes or skin necrosis: Yes Has patient had a PCN reaction that required hospitalization No Has patient had a PCN reaction occurring within the last 10 years: No If all of the above answers are "NO", then may proceed with Cephalosporin use.   Tizanidine Other (See Comments)   Heart flutter         Medication List      albuterol 108 (90 Base) MCG/ACT inhaler Commonly known as: VENTOLIN HFA Inhale 2 puffs into the lungs every 6 (six) hours as needed for wheezing or shortness of breath.   amLODipine 10 MG tablet Commonly known as: NORVASC Take 10 mg by mouth at bedtime.   clopidogrel 75 MG tablet Commonly known as: PLAVIX Take 75 mg by mouth at bedtime.   Evolocumab with  Infusor 420 MG/3.5ML Soct Inject 420 mg into the skin every 30 (thirty) days.   gabapentin 100 MG capsule Commonly known as: NEURONTIN Take 100 mg by mouth at bedtime.   GAS-X PO Take 1 capsule by mouth daily as needed (abdominal pain).   glimepiride 2 MG tablet Commonly known as: AMARYL Take 1 mg by mouth 2 (two) times daily.   latanoprost 0.005 % ophthalmic solution Commonly known as: XALATAN Place 1 drop into both eyes at bedtime.   Lidocaine 4 % Ptch Apply  1 patch topically daily as needed (pain).   lidocaine 4 % cream Commonly known as: LMX Apply 1 application topically as needed (pain).   lisinopril 10 MG tablet Commonly known as: ZESTRIL Take 10 mg by mouth every morning.   loperamide 1 MG/5ML solution Commonly known as: IMODIUM Take 1 mg by mouth as needed for diarrhea or loose stools.   metFORMIN 500 MG 24 hr tablet Commonly known as: GLUCOPHAGE-XR Take 500 mg by mouth 2 (two) times daily.   methocarbamol 500 MG tablet Commonly known as: Robaxin Take 1 tablet (500 mg total) by mouth 3 (three) times daily as needed.   OPCON-A OP Apply 1 drop to eye 2 (two) times daily as needed (allergies.).   sodium chloride 0.65 % Soln nasal spray Commonly known as: OCEAN Place 1 spray into both nostrils daily as needed for congestion.   traMADol 50 MG tablet Commonly known as: ULTRAM Take 50 mg by mouth every 6 (six) hours as needed (Back pain.).   Vitamin D 50 MCG (2000 UT) Caps Take 2,000 Units by mouth daily.        Review of systems negative except as noted in HPI / PMHx or noted below:  Review of Systems  Constitutional: Negative.   HENT: Negative.    Eyes: Negative.   Respiratory: Negative.    Cardiovascular: Negative.   Gastrointestinal: Negative.   Genitourinary: Negative.   Musculoskeletal: Negative.   Skin: Negative.   Neurological: Negative.   Endo/Heme/Allergies: Negative.   Psychiatric/Behavioral: Negative.     Family History  Problem  Relation Age of Onset   Diverticulitis Mother    Hypertension Mother    Heart attack Father    Heart attack Brother    Diabetes Brother    Hypertension Sister     Social History   Socioeconomic History   Marital status: Divorced    Spouse name: Not on file   Number of children: Not on file   Years of education: Not on file   Highest education level: Not on file  Occupational History   Not on file  Tobacco Use   Smoking status: Former    Packs/day: 2.00    Years: 20.00    Pack years: 40.00    Types: Cigarettes    Quit date: 02/15/1982    Years since quitting: 38.8   Smokeless tobacco: Never  Substance and Sexual Activity   Alcohol use: Yes    Comment: "stopped drinking in1984"   Drug use: No   Sexual activity: Not Currently  Other Topics Concern   Not on file  Social History Narrative   Not on file   Environmental and Social history  Lives in a condominium with a dry environment, cat located inside the household, carpet in the bedroom, no plastic on the bed, no plastic on the pillow, and no smoking ongoing with inside the household.  Objective:   Vitals:   12/16/20 1004  BP: (!) 142/64  Pulse: 63  Resp: 14  Temp: 98 F (36.7 C)  SpO2: 95%   Height: 5\' 1"  (154.9 cm) Weight: 143 lb (64.9 kg)  Physical Exam Constitutional:      Appearance: She is not diaphoretic.  HENT:     Head: Normocephalic.     Right Ear: Tympanic membrane, ear canal and external ear normal.     Left Ear: Tympanic membrane, ear canal and external ear normal.     Nose: Nose normal. No mucosal edema or rhinorrhea.  Mouth/Throat:     Mouth: Mucous membranes are dry.     Pharynx: Uvula midline. No oropharyngeal exudate.  Eyes:     Conjunctiva/sclera: Conjunctivae normal.  Neck:     Thyroid: No thyromegaly.     Trachea: Trachea normal. No tracheal tenderness or tracheal deviation.  Cardiovascular:     Rate and Rhythm: Normal rate and regular rhythm.     Heart sounds: Normal heart  sounds, S1 normal and S2 normal. No murmur heard. Pulmonary:     Effort: No respiratory distress.     Breath sounds: Normal breath sounds. No stridor. No wheezing or rales.  Lymphadenopathy:     Head:     Right side of head: No tonsillar adenopathy.     Left side of head: No tonsillar adenopathy.     Cervical: No cervical adenopathy.  Skin:    Findings: No erythema or rash.     Nails: There is no clubbing.  Neurological:     Mental Status: She is alert.    Diagnostics: Allergy skin tests were performed.   Spirometry was performed and demonstrated an FEV1 of 1.67 @ 88 % of predicted. FEV1/FVC = 0.78  Results of a chest CT angio performed 25 September 2020 identified the following:  Cardiovascular: Unenhanced imaging of the heart and great vessels demonstrates no pericardial effusion. Normal caliber of the thoracic aorta. Mild atherosclerosis of the aortic arch.   Mediastinum/Nodes: Stable 2.8 cm right lobe thyroid nodule, previously biopsied. No pathologic adenopathy. Trachea and esophagus are unremarkable.   Lungs/Pleura: No acute airspace disease, effusion, or pneumothorax. Central airways are patent.   There is a 3 x 4 mm noncalcified nodule within the left upper lobe, reference image 27/5. Numerous other calcified granulomas are seen throughout the lungs. No other noncalcified pulmonary nodules are observed.   Assessment and Plan:    1. Perennial allergic rhinitis   2. LPRD (laryngopharyngeal reflux disease)   3. Sicca syndrome ([place])   4. On angiotensin-converting enzyme (ACE) inhibitors     1.  Allergen avoidance measures?  2.  Avoid all oral antihistamines  3.  Change lisinopril to losartan 50 mg 1 time per day.  Check BP.  4.  Treat and prevent inflammation:   A. Montelukast 10 mg - 1 tablet 1 time per day  5.  Treat and prevent reflux/LPR:  A. Omeprazole 40 mg - 2 tablet 2 times per day B. Famotidine 40 mg - 1 tablet in evening C. Consolidate all  caffeine and chocolate as much as possible  6.  Treat and prevent dry eyes / dry mouth syndrome:  A. Systane gel drop before bedtime B. Systane drops multiple times per day if needed C. Biotene mouthwash multiple times per day if needed  7. Can use Pataday - 1 drop each eye if needed  8.  Blood - ANA w/R for sicca syndrome  9. Return to clinic in 4 weeks or earlier if problem   Kathleen Oliver has a history most consistent with LPR and we will be treating her for that issue with the therapy noted above.  But she also has rather extreme sicca syndrome which may be contributing to some of her throat irritation and she is also on an ACE inhibitor which could be resulting in some low-grade recurrent angioedema of her throat and she may have some degree of inflammation affecting that area.  For those reasons she will use Biotene mouthwash, eliminate her lisinopril use,replace that antihypertensive with losartan, and start montelukast.  We will check an ANA with reflex for her sicca syndrome.  I will see her back in this clinic in 4 weeks or earlier if there is a problem.  Jiles Prows, MD Allergy / Immunology [facility]

## 2020-12-16 NOTE — Patient Instructions (Addendum)
  1.  Allergen avoidance measures?  2.  Avoid all oral antihistamines  3.  Change lisinopril to losartan 50 mg 1 time per day.  Check BP.  4.  Treat and prevent inflammation:   A. Montelukast 10 mg - 1 tablet 1 time per day  5.  Treat and prevent reflux/LPR:  A. Omeprazole 40 mg - 2 tablet 2 times per day B. Famotidine 40 mg - 1 tablet in evening C. Consolidate all caffeine and chocolate as much as possible  6.  Treat and prevent dry eyes / dry mouth syndrome:  A. Systane gel drop before bedtime B. Systane drops multiple times per day if needed C. Biotene mouthwash multiple times per day if needed  7. Can use Pataday - 1 drop each eye if needed  8.  Blood - ANA w/R for sicca syndrome  9. Return to clinic in 4 weeks or earlier if problem

## 2020-12-17 ENCOUNTER — Encounter: Payer: Self-pay | Admitting: Allergy and Immunology

## 2020-12-17 LAB — ANA W/REFLEX: Anti Nuclear Antibody (ANA): NEGATIVE

## 2020-12-23 DIAGNOSIS — D509 Iron deficiency anemia, unspecified: Secondary | ICD-10-CM | POA: Diagnosis not present

## 2020-12-23 DIAGNOSIS — E1140 Type 2 diabetes mellitus with diabetic neuropathy, unspecified: Secondary | ICD-10-CM | POA: Diagnosis not present

## 2020-12-23 DIAGNOSIS — I10 Essential (primary) hypertension: Secondary | ICD-10-CM | POA: Diagnosis not present

## 2020-12-23 DIAGNOSIS — E7800 Pure hypercholesterolemia, unspecified: Secondary | ICD-10-CM | POA: Diagnosis not present

## 2021-01-01 ENCOUNTER — Telehealth: Payer: Self-pay

## 2021-01-01 NOTE — Telephone Encounter (Signed)
Informed pt of this and she will follow up with her pcp

## 2021-01-01 NOTE — Telephone Encounter (Signed)
Patient called stating since she started on all the medications Dr Neldon Mc sent in last week, she has been experiencing leg cramping/weakness & very jittery when she wakes up in the morning. Patient is wondering could this be from the medications or if Dr Neldon Mc thinks she needs her Potassium levels checked?  Please Advise

## 2021-01-01 NOTE — Telephone Encounter (Signed)
These inform lauri that the medications I gave her should not produce these issues. Would be best to have her contact her primary doctor to get a check of her blood, including potassium, and have her BP checked. She may be low on magnesium. She can use a magnesium supplement two times per day

## 2021-01-06 DIAGNOSIS — I10 Essential (primary) hypertension: Secondary | ICD-10-CM | POA: Diagnosis not present

## 2021-01-06 DIAGNOSIS — E1140 Type 2 diabetes mellitus with diabetic neuropathy, unspecified: Secondary | ICD-10-CM | POA: Diagnosis not present

## 2021-01-06 DIAGNOSIS — M542 Cervicalgia: Secondary | ICD-10-CM | POA: Diagnosis not present

## 2021-01-06 DIAGNOSIS — R29898 Other symptoms and signs involving the musculoskeletal system: Secondary | ICD-10-CM | POA: Diagnosis not present

## 2021-01-14 ENCOUNTER — Ambulatory Visit (INDEPENDENT_AMBULATORY_CARE_PROVIDER_SITE_OTHER): Payer: Medicare HMO | Admitting: Family Medicine

## 2021-01-14 ENCOUNTER — Encounter: Payer: Self-pay | Admitting: Family Medicine

## 2021-01-14 ENCOUNTER — Other Ambulatory Visit: Payer: Self-pay

## 2021-01-14 VITALS — Ht 61.0 in | Wt 138.5 lb

## 2021-01-14 DIAGNOSIS — H1013 Acute atopic conjunctivitis, bilateral: Secondary | ICD-10-CM

## 2021-01-14 DIAGNOSIS — J300 Vasomotor rhinitis: Secondary | ICD-10-CM

## 2021-01-14 DIAGNOSIS — R0989 Other specified symptoms and signs involving the circulatory and respiratory systems: Secondary | ICD-10-CM

## 2021-01-14 DIAGNOSIS — K219 Gastro-esophageal reflux disease without esophagitis: Secondary | ICD-10-CM | POA: Diagnosis not present

## 2021-01-14 DIAGNOSIS — H1010 Acute atopic conjunctivitis, unspecified eye: Secondary | ICD-10-CM

## 2021-01-14 DIAGNOSIS — H6983 Other specified disorders of Eustachian tube, bilateral: Secondary | ICD-10-CM

## 2021-01-14 DIAGNOSIS — R198 Other specified symptoms and signs involving the digestive system and abdomen: Secondary | ICD-10-CM | POA: Diagnosis not present

## 2021-01-14 MED ORDER — FLUTICASONE PROPIONATE 50 MCG/ACT NA SUSP: NASAL | 3 refills | Status: DC

## 2021-01-14 MED ORDER — AZELASTINE HCL 0.1 % NA SOLN: NASAL | 6 refills | Status: DC

## 2021-01-14 NOTE — Patient Instructions (Signed)
Non-allergic rhinitis/vasomotor rhinitis Begin nasal saline rinses or nasal saline mist twice a day Begin Flonase nasal spray one spray in each nostril once a day for non-allergic rhinitis control Begin azelastine nasal spray 2 sprays in each nostril twice a day to control non-allergic rhinitis You may use azelastine nasal spray as needed for a runny nose  Allergic conjunctivitis Continue ketotifen eye drops one drop in each eye twice a day as needed for red or itchy eyes You may also use a moisturizing drop as needed for dry eyes  Eustachian tube dysfunction Continue the treatment plan as listed above for non-allergic rhinitis  Reflux Continue dietary and lifestyle modifications as listed below Stop omeprazole 40 mg once a day for now Begin Dexilant 60 mg once a day in the morning and continue famotidine 40 mg in the evening  Globus sensation Referral to GI specialist for further evaluation and treatment of globus sensation  Call the clinic if this treatment plan is not working well for you  Follow up in 1 month or sooner if needed.

## 2021-01-14 NOTE — Progress Notes (Signed)
RE: Kathleen Oliver MRN: [id number] DOB: Nov 18, 1945 Date of Telemedicine Visit: 01/14/2021  Referring provider: [name], MD Primary care provider: [name], MD  Chief Complaint: Follow-up (Post nasal drip that feels like she choking, symptoms are more severe at night and hoarseness in the morning./Constantly clearing throat. Ear and nose congestion. Pt stated she will be raising her bed head.)   Telemedicine Follow Up Visit via Telephone: I connected with Kathleen Oliver for a follow up on 01/14/21 by telephone and verified that I am speaking with the correct person using two identifiers.   I discussed the limitations, risks, security and privacy concerns of performing an evaluation and management service by telephone and the availability of in person appointments. I also discussed with the patient that there may be a patient responsible charge related to this service. The patient expressed understanding and agreed to proceed.  Patient is at home  Provider is at the home office.  Visit start time: 1054 Visit end time: 1147 Insurance consent/check in by: [employer] [facility] consent and medical assistant/nurse: Vira Agar  History of Present Illness: She is a 75 y.o. female, who is being followed for allergic rhinitis, allergic conjunctivitis, reflux, globus sensation, and eustachian tube dysfunction. Her previous allergy office visit was on 12/16/2020 with Dr. Neldon Oliver. At today's visit, she reports that her non-allergic rhinitis has not been well controlled with symptoms including clear rhinorrhea occurring at night, nasal congestion during the day, occasional sneeze, and copious post nasal drainage with an intermittent globus sensation. She reports these symptoms occur year round with no seasonal variation. She continues nasal saline rinse and occasional Atrovent nasal spray. Reflux is reported as not well controlled with heartburn occurring frequently. She had been taking omeprazole twice a  day and famotidine 40 mg once a day, however, she reports that she recently decreased omeprazole to once a day due to a plethora of diarrhea. She reports that she experienced fewer symptoms of reflux and globus sensation while taking Dexilant 60 mg once a day. She denies vomiting or getting food stuck in her esophagus, however, she reports an increase in burping. She reports that about 20-30 years ago she had some upper GI testing which ended with esophageal dilation. She has stopped taking lisinopril and started taking losartan with adequate blood pressure control. Allergic conjunctivitis is reported as moderately well controlled with Systane and ketotifen. She did have a negative ANA on 12/16/2020. She reports bilateral intermittent ear pressure that has been occurring for many years. Her current medications are listed in the chart.   Assessment and Plan: Kathleen Oliver is a 75 y.o. female with: Patient Instructions  Non-allergic rhinitis/vasomotor rhinitis Begin nasal saline rinses or nasal saline mist twice a day Begin Flonase nasal spray one spray in each nostril once a day for non-allergic rhinitis control Begin azelastine nasal spray 2 sprays in each nostril twice a day to control non-allergic rhinitis You may use azelastine nasal spray as needed for a runny nose  Allergic conjunctivitis Continue ketotifen eye drops one drop in each eye twice a day as needed for red or itchy eyes You may also use a moisturizing drop as needed for dry eyes  Eustachian tube dysfunction Continue the treatment plan as listed above for non-allergic rhinitis  Reflux Continue dietary and lifestyle modifications as listed below Stop omeprazole 40 mg once a day for now Begin Dexilant 60 mg once a day in the morning and continue famotidine 40 mg in the evening  Globus sensation Referral to GI  specialist for further evaluation and treatment of globus sensation  Call the clinic if this treatment plan is not working well  for you  Follow up in 1 month or sooner if needed.    Return in about 4 weeks (around 02/11/2021), or if symptoms worsen or fail to improve.  Meds ordered this encounter  Medications   fluticasone (FLONASE) 50 MCG/ACT nasal spray    Sig: 1 dpray in each nostril twice a day.    Dispense:  16 g    Refill:  3   azelastine (ASTELIN) 0.1 % nasal spray    Sig: 2 spray in each nostril twice a day.    Dispense:  30 mL    Refill:  6    Medication List:  Current Outpatient Medications  Medication Sig Dispense Refill   amLODipine (NORVASC) 10 MG tablet Take 10 mg by mouth at bedtime.      azelastine (ASTELIN) 0.1 % nasal spray 2 spray in each nostril twice a day. 30 mL 6   Cholecalciferol (VITAMIN D) 2000 UNITS CAPS Take 2,000 Units by mouth daily.     clopidogrel (PLAVIX) 75 MG tablet Take 75 mg by mouth at bedtime.      Evolocumab with Infusor 420 MG/3.5ML SOCT Inject 420 mg into the skin every 30 (thirty) days.     famotidine (PEPCID) 40 MG tablet Take 1 tablet (40 mg total) by mouth daily. 30 tablet 5   fluticasone (FLONASE) 50 MCG/ACT nasal spray 1 dpray in each nostril twice a day. 16 g 3   gabapentin (NEURONTIN) 100 MG capsule Take 100 mg by mouth at bedtime.     glimepiride (AMARYL) 2 MG tablet Take 1 mg by mouth 2 (two) times daily.     latanoprost (XALATAN) 0.005 % ophthalmic solution Place 1 drop into both eyes at bedtime.      lidocaine (LMX) 4 % cream Apply 1 application topically as needed (pain).     Lidocaine 4 % PTCH Apply 1 patch topically daily as needed (pain).     loperamide (IMODIUM) 1 MG/5ML solution Take 1 mg by mouth as needed for diarrhea or loose stools.     losartan (COZAAR) 50 MG tablet Take 1 tablet (50 mg total) by mouth daily. And check Blood Pressure 30 tablet 5   metFORMIN (GLUCOPHAGE-XR) 500 MG 24 hr tablet Take 500 mg by mouth 2 (two) times daily.      methocarbamol (ROBAXIN) 500 MG tablet Take 1 tablet (500 mg total) by mouth 3 (three) times daily as  needed. 40 tablet 1   montelukast (SINGULAIR) 10 MG tablet Take 1 tablet (10 mg total) by mouth at bedtime. 30 tablet 5   Mouthwashes (BIOTENE DRY MOUTH GENTLE) LIQD Use as directed 15 mLs in the mouth or throat 3 (three) times daily as needed. 475 mL 5   Naphazoline-Pheniramine (OPCON-A OP) Apply 1 drop to eye 2 (two) times daily as needed (allergies.).     Olopatadine HCl (PATADAY) 0.2 % SOLN Place 1 drop into both eyes 1 day or 1 dose. 2.5 mL 5   omeprazole (PRILOSEC) 40 MG capsule Take 1 capsule (40 mg total) by mouth in the morning and at bedtime. 60 capsule 5   Polyethyl Glycol-Propyl Glycol (SYSTANE) 0.4-0.3 % GEL ophthalmic gel Place 1 application into both eyes at bedtime. 10 mL 5   Propylene Glycol (SYSTANE COMPLETE) 0.6 % SOLN Apply 1 drop to eye 4 (four) times daily as needed. 15 mL 5  Simethicone (GAS-X PO) Take 1 capsule by mouth daily as needed (abdominal pain).     sodium chloride (OCEAN) 0.65 % SOLN nasal spray Place 1 spray into both nostrils daily as needed for congestion.      traMADol (ULTRAM) 50 MG tablet Take 50 mg by mouth every 6 (six) hours as needed (Back pain.).      albuterol (PROVENTIL HFA;VENTOLIN HFA) 108 (90 BASE) MCG/ACT inhaler Inhale 2 puffs into the lungs every 6 (six) hours as needed for wheezing or shortness of breath. (Patient not taking: Reported on 01/14/2021)     Evolocumab with Infusor ([place]) 420 MG/3.5ML SOCT Repatha Pushtronex 420 mg/3.5 mL subcutaneous wearable injector  INJECT 3 & 1 2 (THREE & ONE HALF) ML ONCE EVERY 30 DAYS     No current facility-administered medications for this visit.   Allergies: Allergies  Allergen Reactions   Cinobac [Cinoxacin]     hives   Codeine Itching   Penicillins Hives    Has patient had a PCN reaction causing immediate rash, facial/tongue/throat swelling, SOB or lightheadedness with hypotension: Yes Has patient had a PCN reaction causing severe rash involving mucus membranes or skin necrosis:  Yes Has patient had a PCN reaction that required hospitalization No Has patient had a PCN reaction occurring within the last 10 years: No If all of the above answers are "NO", then may proceed with Cephalosporin use.     Tizanidine Other (See Comments)    Heart flutter    I reviewed her past medical history, social history, family history, and environmental history and no significant changes have been reported from previous visit on 12/16/2020.  Objective: Physical Exam Not obtained as encounter was done via telephone.   Previous notes and tests were reviewed.  I discussed the assessment and treatment plan with the patient. The patient was provided an opportunity to ask questions and all were answered. The patient agreed with the plan and demonstrated an understanding of the instructions.   The patient was advised to call back or seek an in-person evaluation if the symptoms worsen or if the condition fails to improve as anticipated.  I provided 53 minutes of non-face-to-face time during this encounter.  It was my pleasure to participate in [place]so's care today. Please feel free to contact me with any questions or concerns.   Sincerely,  Gareth Morgan, FNP

## 2021-01-15 ENCOUNTER — Telehealth: Payer: Self-pay

## 2021-01-15 NOTE — Telephone Encounter (Signed)
-----   Message from Dara Hoyer, [place] sent at 01/14/2021 10:10 PM EDT ----- Naoma Diener, Can you please refer this patient to a GI specialty for evaluation of globus sensation despite aggressive treatment for rhinorrhea and reflux. Thank you

## 2021-01-21 NOTE — Telephone Encounter (Signed)
Referral has been closed.  Thanks.

## 2021-01-21 NOTE — Telephone Encounter (Signed)
Kathleen Oliver, Kathleen Oliver called and said that she has been see at [facility].

## 2021-01-23 DIAGNOSIS — R29898 Other symptoms and signs involving the musculoskeletal system: Secondary | ICD-10-CM | POA: Diagnosis not present

## 2021-02-05 ENCOUNTER — Telehealth: Payer: Self-pay

## 2021-02-05 NOTE — Telephone Encounter (Signed)
Patients primary care office called requesting our office to continue to send in refills on the patients Losartan '50MG'$ . Please send the refills to [facility].

## 2021-02-09 NOTE — Telephone Encounter (Signed)
Called the patient's primary physician Dr. Felipa Eth at [facility] at [phone] and was directed to his nurses line. I left a detailed voicemail advising that unfortunately we do not manage blood pressure medication and that the patient will need to continue to see him to manage her blood pressure and medication. Advised to call back with any further questions.

## 2021-02-12 DIAGNOSIS — Z7984 Long term (current) use of oral hypoglycemic drugs: Secondary | ICD-10-CM | POA: Diagnosis not present

## 2021-02-12 DIAGNOSIS — E1140 Type 2 diabetes mellitus with diabetic neuropathy, unspecified: Secondary | ICD-10-CM | POA: Diagnosis not present

## 2021-02-12 DIAGNOSIS — Z79899 Other long term (current) drug therapy: Secondary | ICD-10-CM | POA: Diagnosis not present

## 2021-02-12 DIAGNOSIS — E7800 Pure hypercholesterolemia, unspecified: Secondary | ICD-10-CM | POA: Diagnosis not present

## 2021-02-12 DIAGNOSIS — I10 Essential (primary) hypertension: Secondary | ICD-10-CM | POA: Diagnosis not present

## 2021-02-12 DIAGNOSIS — R29898 Other symptoms and signs involving the musculoskeletal system: Secondary | ICD-10-CM | POA: Diagnosis not present

## 2021-02-12 DIAGNOSIS — D509 Iron deficiency anemia, unspecified: Secondary | ICD-10-CM | POA: Diagnosis not present

## 2021-02-12 NOTE — Telephone Encounter (Signed)
Patients PCP called asking for a refill of the losartan. I informed nurse that we do not manage blood pressure medications and that they needed to refill themselves or contact her other physician who will manage her blood pressure. Nurse verbalized understanding and agreed to do so.

## 2021-02-18 DIAGNOSIS — M25511 Pain in right shoulder: Secondary | ICD-10-CM | POA: Diagnosis not present

## 2021-02-25 DIAGNOSIS — M7541 Impingement syndrome of right shoulder: Secondary | ICD-10-CM | POA: Diagnosis not present

## 2021-02-25 DIAGNOSIS — M19111 Post-traumatic osteoarthritis, right shoulder: Secondary | ICD-10-CM | POA: Diagnosis not present

## 2021-02-25 DIAGNOSIS — M25511 Pain in right shoulder: Secondary | ICD-10-CM | POA: Diagnosis not present

## 2021-03-06 ENCOUNTER — Other Ambulatory Visit: Payer: Self-pay

## 2021-03-06 ENCOUNTER — Encounter: Payer: Self-pay | Admitting: Family Medicine

## 2021-03-06 ENCOUNTER — Ambulatory Visit: Payer: Medicare HMO | Admitting: Family Medicine

## 2021-03-06 VITALS — BP 132/86 | HR 67 | Temp 98.0°F | Resp 16 | Ht 61.0 in | Wt 143.6 lb

## 2021-03-06 DIAGNOSIS — J300 Vasomotor rhinitis: Secondary | ICD-10-CM | POA: Diagnosis not present

## 2021-03-06 DIAGNOSIS — R0989 Other specified symptoms and signs involving the circulatory and respiratory systems: Secondary | ICD-10-CM

## 2021-03-06 DIAGNOSIS — R198 Other specified symptoms and signs involving the digestive system and abdomen: Secondary | ICD-10-CM

## 2021-03-06 DIAGNOSIS — H6983 Other specified disorders of Eustachian tube, bilateral: Secondary | ICD-10-CM | POA: Diagnosis not present

## 2021-03-06 DIAGNOSIS — K219 Gastro-esophageal reflux disease without esophagitis: Secondary | ICD-10-CM

## 2021-03-06 DIAGNOSIS — H6993 Unspecified Eustachian tube disorder, bilateral: Secondary | ICD-10-CM

## 2021-03-06 DIAGNOSIS — H1013 Acute atopic conjunctivitis, bilateral: Secondary | ICD-10-CM | POA: Diagnosis not present

## 2021-03-06 DIAGNOSIS — R09A2 Foreign body sensation, throat: Secondary | ICD-10-CM

## 2021-03-06 DIAGNOSIS — H1010 Acute atopic conjunctivitis, unspecified eye: Secondary | ICD-10-CM

## 2021-03-06 NOTE — Progress Notes (Signed)
[place] [address] Dept: [phone]  FOLLOW UP NOTE  Patient ID: Kathleen Oliver, female    DOB: March 23, 1946  Age: 74 y.o. MRN: [id number] Date of Office Visit: 03/06/2021  Assessment  Chief Complaint: Allergic Rhinitis  and Other (Still gets mucus in the back of her throat - she doesn't use the Flonase because it irritates her nose.  She does use the azelastine nasal spray and singular at night. She cant use the omeprazole it causes water diarrhea. Takes pepcid at night and dexilant in the morning.  )  HPI Kathleen Oliver is a 76 year old female who presents to the clinic for a follow-up visit.  She was last seen in this clinic via telephone visit on 01/14/2021 for evaluation of chronic rhinitis, allergic conjunctivitis, reflux, eustachian tube dysfunction, and a globus sensation.  At today's visit, she reports chronic rhinitis as moderately well controlled with symptoms including occasional nasal congestion, occasional sneeze, and frequent clear thick postnasal drainage.  She continues nasal saline rinses as needed and takes azelastine nasal spray and montelukast 10 mg daily.  She is not currently using Flonase as she reports this burns and irritates her nostrils. She demonstrates excellent Flonase application technique. Her last skin prick testing to environmental allergens was negative on 11/26/2020.  She does report an intermittent full feeling in both ears.  She denies pain or change in hearing with these episodes.  Allergic conjunctivitis is reported as well controlled with a decrease in watery drainage since her last visit to this clinic.  She continues ketotifen eyedrops and lubricating eyedrops as needed.  Reflux is reported as well controlled with no symptoms including heartburn or vomiting.  She continues Dexilant 60 mg in the morning and famotidine 40 mg in the evening.  She does report that she occasionally gets food stuck in her esophagus and then begins burping.   Today she brings in an evaluation of a barium swallow from 06/07/2012 which indicates "significant gastroesophageal reflux to the level of the thoracic inlet.  No evidence of mucosal ulceration or stricture.  A barium tablet passes without delay into the stomach.  Moderate esophageal dysmotility with decreased primaries stripping wave".  She has an appointment on September 20 with [facility] for further evaluation and treatment of reflux.  Her current medications are listed in the chart.   Drug Allergies:  Allergies  Allergen Reactions   Cinobac [Cinoxacin]     hives   Codeine Itching   Penicillins Hives    Has patient had a PCN reaction causing immediate rash, facial/tongue/throat swelling, SOB or lightheadedness with hypotension: Yes Has patient had a PCN reaction causing severe rash involving mucus membranes or skin necrosis: Yes Has patient had a PCN reaction that required hospitalization No Has patient had a PCN reaction occurring within the last 10 years: No If all of the above answers are "NO", then may proceed with Cephalosporin use.     Tizanidine Other (See Comments)    Heart flutter     Physical Exam: BP 132/86   Pulse 67   Temp 98 F (36.7 C)   Resp 16   Ht '5\' 1"'$  (1.549 m)   Wt 143 lb 9.6 oz (65.1 kg)   SpO2 95%   BMI 27.13 kg/m    Physical Exam Vitals reviewed.  Constitutional:      Appearance: Normal appearance.  HENT:     Head: Normocephalic and atraumatic.     Right Ear: Tympanic membrane  normal.     Left Ear: Tympanic membrane normal.     Nose:     Comments: Bilateral nares slightly erythematous with clear nasal drainage noted.  Pharynx normal.  Ears normal.  Eyes normal.    Mouth/Throat:     Pharynx: Oropharynx is clear.  Eyes:     Conjunctiva/sclera: Conjunctivae normal.  Cardiovascular:     Rate and Rhythm: Normal rate and regular rhythm.     Heart sounds: Normal heart sounds. No murmur heard. Pulmonary:      Effort: Pulmonary effort is normal.     Breath sounds: Normal breath sounds.     Comments: Lungs clear to auscultation Musculoskeletal:        General: Normal range of motion.     Cervical back: Normal range of motion and neck supple.  Skin:    General: Skin is warm and dry.  Neurological:     Mental Status: She is alert and oriented to person, place, and time.  Psychiatric:        Mood and Affect: Mood normal.        Behavior: Behavior normal.        Thought Content: Thought content normal.        Judgment: Judgment normal.    Assessment and Plan: 1. LPRD (laryngopharyngeal reflux disease)   2. Globus sensation   3. Vasomotor rhinitis   4. Seasonal allergic conjunctivitis   5. Dysfunction of both eustachian tubes      Patient Instructions  Non-allergic rhinitis/vasomotor rhinitis Increase nasal saline rinses or nasal saline mist twice a day Begin Nasacort nasal spray 2 sprays in each nostril once a day for non-allergic rhinitis control.  In the right nostril, point the applicator out toward the right ear. In the left nostril, point the applicator out toward the left ear Continue azelastine nasal spray 2 sprays in each nostril twice a day to control non-allergic rhinitis  Allergic conjunctivitis Continue ketotifen eye drops one drop in each eye twice a day as needed for red or itchy eyes You may also use a moisturizing drop as needed for dry eyes  Eustachian tube dysfunction Continue the treatment plan as listed above for non-allergic rhinitis  Reflux Continue dietary and lifestyle modifications as listed below Continue Dexilant 60 mg once a day in the morning and continue famotidine 40 mg in the evening  Globus sensation Keep your appointment that you have scheduled for March 17, 2021 with [facility]  Call the clinic if this treatment plan is not working well for you  Follow up in 3 months or sooner if needed.   Return in about 3 months (around 06/05/2021), or if  symptoms worsen or fail to improve.    Thank you for the opportunity to care for this patient.  Please do not hesitate to contact me with questions.  Gareth Morgan, FNP [facility] [place] [facility]

## 2021-03-06 NOTE — Patient Instructions (Addendum)
Non-allergic rhinitis/vasomotor rhinitis Increase nasal saline rinses or nasal saline mist twice a day Begin Nasacort nasal spray 2 sprays in each nostril once a day for non-allergic rhinitis control.  In the right nostril, point the applicator out toward the right ear. In the left nostril, point the applicator out toward the left ear Continue azelastine nasal spray 2 sprays in each nostril twice a day to control non-allergic rhinitis  Allergic conjunctivitis Continue ketotifen eye drops one drop in each eye twice a day as needed for red or itchy eyes You may also use a moisturizing drop as needed for dry eyes  Eustachian tube dysfunction Continue the treatment plan as listed above for non-allergic rhinitis  Reflux Continue dietary and lifestyle modifications as listed below Continue Dexilant 60 mg once a day in the morning and continue famotidine 40 mg in the evening  Globus sensation Keep your appointment that you have scheduled for March 17, 2021 with [facility]  Call the clinic if this treatment plan is not working well for you  Follow up in 3 months or sooner if needed.

## 2021-03-13 DIAGNOSIS — E7800 Pure hypercholesterolemia, unspecified: Secondary | ICD-10-CM | POA: Diagnosis not present

## 2021-03-13 DIAGNOSIS — E1140 Type 2 diabetes mellitus with diabetic neuropathy, unspecified: Secondary | ICD-10-CM | POA: Diagnosis not present

## 2021-03-13 DIAGNOSIS — I10 Essential (primary) hypertension: Secondary | ICD-10-CM | POA: Diagnosis not present

## 2021-03-13 DIAGNOSIS — K219 Gastro-esophageal reflux disease without esophagitis: Secondary | ICD-10-CM | POA: Diagnosis not present

## 2021-03-17 ENCOUNTER — Other Ambulatory Visit: Payer: Self-pay | Admitting: Physician Assistant

## 2021-03-17 DIAGNOSIS — R1310 Dysphagia, unspecified: Secondary | ICD-10-CM | POA: Diagnosis not present

## 2021-03-17 DIAGNOSIS — Z86010 Personal history of colonic polyps: Secondary | ICD-10-CM | POA: Diagnosis not present

## 2021-03-17 DIAGNOSIS — K224 Dyskinesia of esophagus: Secondary | ICD-10-CM | POA: Diagnosis not present

## 2021-03-17 DIAGNOSIS — K219 Gastro-esophageal reflux disease without esophagitis: Secondary | ICD-10-CM | POA: Diagnosis not present

## 2021-03-17 DIAGNOSIS — R634 Abnormal weight loss: Secondary | ICD-10-CM | POA: Diagnosis not present

## 2021-03-20 DIAGNOSIS — I10 Essential (primary) hypertension: Secondary | ICD-10-CM | POA: Diagnosis not present

## 2021-03-20 DIAGNOSIS — R911 Solitary pulmonary nodule: Secondary | ICD-10-CM | POA: Diagnosis not present

## 2021-03-20 DIAGNOSIS — Z23 Encounter for immunization: Secondary | ICD-10-CM | POA: Diagnosis not present

## 2021-03-20 DIAGNOSIS — Z1389 Encounter for screening for other disorder: Secondary | ICD-10-CM | POA: Diagnosis not present

## 2021-03-20 DIAGNOSIS — Z79899 Other long term (current) drug therapy: Secondary | ICD-10-CM | POA: Diagnosis not present

## 2021-03-20 DIAGNOSIS — E1140 Type 2 diabetes mellitus with diabetic neuropathy, unspecified: Secondary | ICD-10-CM | POA: Diagnosis not present

## 2021-03-20 DIAGNOSIS — I700 Atherosclerosis of aorta: Secondary | ICD-10-CM | POA: Diagnosis not present

## 2021-03-20 DIAGNOSIS — E7800 Pure hypercholesterolemia, unspecified: Secondary | ICD-10-CM | POA: Diagnosis not present

## 2021-03-20 DIAGNOSIS — Z0000 Encounter for general adult medical examination without abnormal findings: Secondary | ICD-10-CM | POA: Diagnosis not present

## 2021-03-20 DIAGNOSIS — E559 Vitamin D deficiency, unspecified: Secondary | ICD-10-CM | POA: Diagnosis not present

## 2021-03-20 DIAGNOSIS — H353131 Nonexudative age-related macular degeneration, bilateral, early dry stage: Secondary | ICD-10-CM | POA: Diagnosis not present

## 2021-03-20 DIAGNOSIS — I351 Nonrheumatic aortic (valve) insufficiency: Secondary | ICD-10-CM | POA: Diagnosis not present

## 2021-03-25 ENCOUNTER — Ambulatory Visit
Admission: RE | Admit: 2021-03-25 | Discharge: 2021-03-25 | Disposition: A | Discharge status: Home / Self Care | Payer: Medicare HMO | Source: Ambulatory Visit | Attending: Physician Assistant | Admitting: Physician Assistant

## 2021-03-25 DIAGNOSIS — R1310 Dysphagia, unspecified: Secondary | ICD-10-CM | POA: Diagnosis not present

## 2021-03-25 DIAGNOSIS — K224 Dyskinesia of esophagus: Secondary | ICD-10-CM | POA: Diagnosis not present

## 2021-03-25 DIAGNOSIS — K219 Gastro-esophageal reflux disease without esophagitis: Secondary | ICD-10-CM | POA: Diagnosis not present

## 2021-04-06 DIAGNOSIS — E119 Type 2 diabetes mellitus without complications: Secondary | ICD-10-CM | POA: Diagnosis not present

## 2021-04-06 DIAGNOSIS — H353131 Nonexudative age-related macular degeneration, bilateral, early dry stage: Secondary | ICD-10-CM | POA: Diagnosis not present

## 2021-04-06 DIAGNOSIS — H401131 Primary open-angle glaucoma, bilateral, mild stage: Secondary | ICD-10-CM | POA: Diagnosis not present

## 2021-04-06 DIAGNOSIS — Z961 Presence of intraocular lens: Secondary | ICD-10-CM | POA: Diagnosis not present

## 2021-04-08 DIAGNOSIS — M542 Cervicalgia: Secondary | ICD-10-CM | POA: Diagnosis not present

## 2021-04-08 DIAGNOSIS — M25511 Pain in right shoulder: Secondary | ICD-10-CM | POA: Diagnosis not present

## 2021-04-08 DIAGNOSIS — M5030 Other cervical disc degeneration, unspecified cervical region: Secondary | ICD-10-CM | POA: Diagnosis not present

## 2021-04-08 DIAGNOSIS — M19111 Post-traumatic osteoarthritis, right shoulder: Secondary | ICD-10-CM | POA: Diagnosis not present

## 2021-04-08 DIAGNOSIS — M7541 Impingement syndrome of right shoulder: Secondary | ICD-10-CM | POA: Diagnosis not present

## 2021-04-17 ENCOUNTER — Encounter: Payer: Self-pay | Admitting: *Deleted

## 2021-04-22 DIAGNOSIS — K449 Diaphragmatic hernia without obstruction or gangrene: Secondary | ICD-10-CM | POA: Diagnosis not present

## 2021-04-22 DIAGNOSIS — K2940 Chronic atrophic gastritis without bleeding: Secondary | ICD-10-CM | POA: Diagnosis not present

## 2021-04-22 DIAGNOSIS — K5730 Diverticulosis of large intestine without perforation or abscess without bleeding: Secondary | ICD-10-CM | POA: Diagnosis not present

## 2021-04-22 DIAGNOSIS — R1310 Dysphagia, unspecified: Secondary | ICD-10-CM | POA: Diagnosis not present

## 2021-04-22 DIAGNOSIS — Z86010 Personal history of colonic polyps: Secondary | ICD-10-CM | POA: Diagnosis not present

## 2021-04-22 DIAGNOSIS — K31A19 Gastric intestinal metaplasia without dysplasia, unspecified site: Secondary | ICD-10-CM | POA: Diagnosis not present

## 2021-04-22 DIAGNOSIS — K2930 Chronic superficial gastritis without bleeding: Secondary | ICD-10-CM | POA: Diagnosis not present

## 2021-04-22 DIAGNOSIS — K649 Unspecified hemorrhoids: Secondary | ICD-10-CM | POA: Diagnosis not present

## 2021-04-22 DIAGNOSIS — D122 Benign neoplasm of ascending colon: Secondary | ICD-10-CM | POA: Diagnosis not present

## 2021-04-22 HISTORY — PX: UPPER GI ENDOSCOPY: SHX6162

## 2021-04-22 HISTORY — PX: COLONOSCOPY: SHX174

## 2021-04-24 DIAGNOSIS — M5412 Radiculopathy, cervical region: Secondary | ICD-10-CM | POA: Diagnosis not present

## 2021-04-27 DIAGNOSIS — D122 Benign neoplasm of ascending colon: Secondary | ICD-10-CM | POA: Diagnosis not present

## 2021-04-27 DIAGNOSIS — K2940 Chronic atrophic gastritis without bleeding: Secondary | ICD-10-CM | POA: Diagnosis not present

## 2021-04-27 DIAGNOSIS — K31A19 Gastric intestinal metaplasia without dysplasia, unspecified site: Secondary | ICD-10-CM | POA: Diagnosis not present

## 2021-05-08 DIAGNOSIS — M5451 Vertebrogenic low back pain: Secondary | ICD-10-CM | POA: Diagnosis not present

## 2021-05-11 DIAGNOSIS — M533 Sacrococcygeal disorders, not elsewhere classified: Secondary | ICD-10-CM | POA: Diagnosis not present

## 2021-05-11 DIAGNOSIS — M62830 Muscle spasm of back: Secondary | ICD-10-CM | POA: Diagnosis not present

## 2021-05-12 DIAGNOSIS — E7800 Pure hypercholesterolemia, unspecified: Secondary | ICD-10-CM | POA: Diagnosis not present

## 2021-05-12 DIAGNOSIS — E1140 Type 2 diabetes mellitus with diabetic neuropathy, unspecified: Secondary | ICD-10-CM | POA: Diagnosis not present

## 2021-05-12 DIAGNOSIS — K219 Gastro-esophageal reflux disease without esophagitis: Secondary | ICD-10-CM | POA: Diagnosis not present

## 2021-05-12 DIAGNOSIS — I10 Essential (primary) hypertension: Secondary | ICD-10-CM | POA: Diagnosis not present

## 2021-05-20 DIAGNOSIS — M533 Sacrococcygeal disorders, not elsewhere classified: Secondary | ICD-10-CM | POA: Diagnosis not present

## 2021-05-28 DIAGNOSIS — K3189 Other diseases of stomach and duodenum: Secondary | ICD-10-CM | POA: Diagnosis not present

## 2021-05-28 DIAGNOSIS — K2940 Chronic atrophic gastritis without bleeding: Secondary | ICD-10-CM | POA: Diagnosis not present

## 2021-05-28 DIAGNOSIS — R109 Unspecified abdominal pain: Secondary | ICD-10-CM | POA: Diagnosis not present

## 2021-05-28 DIAGNOSIS — K224 Dyskinesia of esophagus: Secondary | ICD-10-CM | POA: Diagnosis not present

## 2021-05-28 DIAGNOSIS — K219 Gastro-esophageal reflux disease without esophagitis: Secondary | ICD-10-CM | POA: Diagnosis not present

## 2021-05-28 DIAGNOSIS — K5900 Constipation, unspecified: Secondary | ICD-10-CM | POA: Diagnosis not present

## 2021-05-28 DIAGNOSIS — Z86010 Personal history of colonic polyps: Secondary | ICD-10-CM | POA: Diagnosis not present

## 2021-06-12 ENCOUNTER — Other Ambulatory Visit: Payer: Self-pay

## 2021-06-12 ENCOUNTER — Ambulatory Visit: Payer: Medicare HMO | Admitting: Allergy

## 2021-06-12 ENCOUNTER — Encounter: Payer: Self-pay | Admitting: Allergy

## 2021-06-12 VITALS — BP 134/62 | HR 81 | Temp 98.2°F | Resp 12 | Ht 60.8 in | Wt 142.2 lb

## 2021-06-12 DIAGNOSIS — J45909 Unspecified asthma, uncomplicated: Secondary | ICD-10-CM

## 2021-06-12 DIAGNOSIS — H04123 Dry eye syndrome of bilateral lacrimal glands: Secondary | ICD-10-CM | POA: Insufficient documentation

## 2021-06-12 DIAGNOSIS — J310 Chronic rhinitis: Secondary | ICD-10-CM

## 2021-06-12 DIAGNOSIS — R0989 Other specified symptoms and signs involving the circulatory and respiratory systems: Secondary | ICD-10-CM | POA: Insufficient documentation

## 2021-06-12 DIAGNOSIS — K219 Gastro-esophageal reflux disease without esophagitis: Secondary | ICD-10-CM | POA: Diagnosis not present

## 2021-06-12 MED ORDER — AZELASTINE HCL 0.1 % NA SOLN: 1.0000 | Freq: Two times a day (BID) | NASAL | 5 refills | Status: DC | PRN

## 2021-06-12 NOTE — Progress Notes (Signed)
Follow Up Note  RE: Kathleen Oliver MRN: [id number] DOB: [birth year] Date of Office Visit: 06/12/2021  Referring provider: [name], MD Primary care provider: [name], MD  Chief Complaint: Other (Follow up from last visit and Gastroenterologist. Kathleen Oliver in throat (usually clear and sometimes thick))  History of Present Illness: I had the pleasure of seeing Kathleen Oliver for a follow up visit at the [facility] on 06/12/2021. She is a 75 y.o. female, who is being followed for nonallergic rhinitis, eustachian tube dysfunction, reflux. Her previous allergy office visit was on 03/06/2021 with Kathleen Oliver, [place]. Today is a regular follow up visit.  Rhinitis Currently taking azelastine 1 spray per nostril in the mornings only. Tried Atrovent in the past as well.  Tried Nasacort and she is not sure if it helped and some of the steroid nasal sprays cause burning.  Complaining of her sinuses being too dry.   Using saline nasal spray at times.    Eyes Has dry eyes and uses systane eye drops.  Reflux Saw GI and was told she had esophageal dysmotility. She had EGD and colonoscopy and biopsy showed some precancerous cells in the stomach area. She is scheduled to have another EGD in March.  Currently takes famotidine 40mg  twice a day now and stopped PPI due to concerns about her bones. Recently increased the famotidine to twice a day due to symptoms.   Breathing Only uses albuterol during URIs/bronchitis. Not sure if Singulair is helping.   Assessment and Plan: Kathleen Oliver is a 75 y.o. female with: Nonallergic rhinitis Past history - 2022 skin testing negative to indoor/outdoor allergens. Interim history - denies any significant nasal congestion or PND. Stopped Nasacort as ineffective. Only using Azelastine 1 spray per nostril in the morning as it causes too much dryness. Tried Atrovent in the past as well. Stop Nasacort.  Use azelastine nasal spray 1-2 sprays per  nostril twice a day as needed for runny nose/drainage. Nasal saline spray (i.e., Simply Saline) or nasal saline lavage (i.e., NeilMed) is recommended as needed and prior to medicated nasal sprays. Use a cool mist humidifier in the winter months in the bedroom.  Dry eyes Use OTC rewetting eye drops as needed.  Stop allergy eye drops as it can worsen the dryness.  Followed by an "eye doctor".  GERD (gastroesophageal reflux disease) Followed by GI and had EGD which showed precancerous cells. Stopped PPI due to concerns for her bone. Takes famotidine 40mg  BID now. See handout for lifestyle and dietary modifications. Continue famotidine 40 mg twice a day.  Keep follow up with GI.  Reactive airway disease without complication Uses albuterol only during URIs. Stopped Singulair for awhile with no worsening symptoms. Stop Singulair (montelukast) May use albuterol rescue inhaler 2 puffs every 4 to 6 hours as needed for shortness of breath, chest tightness, coughing, and wheezing. Monitor frequency of use.  Return in about 6 months (around 12/11/2021).  Meds ordered this encounter  Medications   azelastine (ASTELIN) 0.1 % nasal spray    Sig: Place 1-2 sprays into both nostrils 2 (two) times daily as needed (nasal drainage). Use in each nostril as directed    Dispense:  30 mL    Refill:  5    Lab Orders  No laboratory test(s) ordered today    Diagnostics: None.   Medication List:  Current Outpatient Medications  Medication Sig Dispense Refill   amLODipine (NORVASC) 10 MG tablet Take 10 mg by mouth at  bedtime.      azelastine (ASTELIN) 0.1 % nasal spray Place 1-2 sprays into both nostrils 2 (two) times daily as needed (nasal drainage). Use in each nostril as directed 30 mL 5   clopidogrel (PLAVIX) 75 MG tablet Take 75 mg by mouth at bedtime.      Evolocumab with Infusor ([place]) 420 MG/3.5ML SOCT Repatha Pushtronex 420 mg/3.5 mL subcutaneous wearable injector  INJECT  3 & 1 2 (THREE & ONE HALF) ML ONCE EVERY 30 DAYS     Evolocumab with Infusor 420 MG/3.5ML SOCT Inject 420 mg into the skin every 30 (thirty) days.     famotidine (PEPCID) 40 MG tablet Take 1 tablet (40 mg total) by mouth daily. 30 tablet 5   gabapentin (NEURONTIN) 100 MG capsule Take 100 mg by mouth at bedtime.     glimepiride (AMARYL) 2 MG tablet Take 1 mg by mouth 1 day or 1 dose.     latanoprost (XALATAN) 0.005 % ophthalmic solution Place 1 drop into both eyes at bedtime.      lidocaine (LMX) 4 % cream Apply 1 application topically as needed (pain).     Lidocaine 4 % PTCH Apply 1 patch topically daily as needed (pain).     loperamide (IMODIUM) 1 MG/5ML solution Take 1 mg by mouth as needed for diarrhea or loose stools.     losartan (COZAAR) 50 MG tablet Take 1 tablet (50 mg total) by mouth daily. And check Blood Pressure 30 tablet 5   metFORMIN (GLUCOPHAGE-XR) 500 MG 24 hr tablet Take 500 mg by mouth 2 (two) times daily.      Mouthwashes (BIOTENE DRY MOUTH GENTLE) LIQD Use as directed 15 mLs in the mouth or throat 3 (three) times daily as needed. 475 mL 5   Polyethyl Glycol-Propyl Glycol (SYSTANE) 0.4-0.3 % GEL ophthalmic gel Place 1 application into both eyes at bedtime. 10 mL 5   Propylene Glycol (SYSTANE COMPLETE) 0.6 % SOLN Apply 1 drop to eye 4 (four) times daily as needed. 15 mL 5   Simethicone (GAS-X PO) Take 1 capsule by mouth daily as needed (abdominal pain).     sodium chloride (OCEAN) 0.65 % SOLN nasal spray Place 1 spray into both nostrils daily as needed for congestion.      traMADol (ULTRAM) 50 MG tablet Take 50 mg by mouth every 6 (six) hours as needed (Back pain.).      albuterol (PROVENTIL HFA;VENTOLIN HFA) 108 (90 BASE) MCG/ACT inhaler Inhale 2 puffs into the lungs every 6 (six) hours as needed for wheezing or shortness of breath. (Patient not taking: Reported on 03/06/2021)     Cholecalciferol (VITAMIN D) 2000 UNITS CAPS Take 2,000 Units by mouth daily.     methocarbamol  (ROBAXIN) 500 MG tablet Take 1 tablet (500 mg total) by mouth 3 (three) times daily as needed. (Patient not taking: Reported on 06/12/2021) 40 tablet 1   No current facility-administered medications for this visit.   Allergies: Allergies  Allergen Reactions   Cinobac [Cinoxacin]     hives   Codeine Itching   Penicillins Hives    Has patient had a PCN reaction causing immediate rash, facial/tongue/throat swelling, SOB or lightheadedness with hypotension: Yes Has patient had a PCN reaction causing severe rash involving mucus membranes or skin necrosis: Yes Has patient had a PCN reaction that required hospitalization No Has patient had a PCN reaction occurring within the last 10 years: No If all of the above answers are "NO", then  may proceed with Cephalosporin use.     Tizanidine Other (See Comments)    Heart flutter    I reviewed her past medical history, social history, family history, and environmental history and no significant changes have been reported from her previous visit.  Review of Systems  Constitutional:  Negative for appetite change, chills, fever and unexpected weight change.  HENT:  Negative for congestion, postnasal drip and rhinorrhea.   Eyes:  Negative for itching.       Dry eyes  Respiratory:  Negative for cough, chest tightness, shortness of breath and wheezing.   Gastrointestinal:  Negative for abdominal pain.  Musculoskeletal:  Positive for back pain.  Skin:  Negative for rash.  Allergic/Immunologic: Negative for environmental allergies.  Neurological:  Negative for headaches.   Objective: BP 134/62    Pulse 81    Temp 98.2 F (36.8 C) (Temporal)    Resp 12    Ht 5' 0.8" (1.544 m)    Wt 142 lb 3.2 oz (64.5 kg)    SpO2 98%    BMI 27.05 kg/m  Body mass index is 27.05 kg/m. Physical Exam Vitals and nursing note reviewed.  Constitutional:      Appearance: Normal appearance. She is well-developed.  HENT:     Head: Normocephalic and atraumatic.     Right  Ear: Tympanic membrane and external ear normal.     Left Ear: Tympanic membrane and external ear normal.     Nose: Nose normal.     Mouth/Throat:     Mouth: Mucous membranes are moist.     Pharynx: Oropharynx is clear.  Eyes:     Conjunctiva/sclera: Conjunctivae normal.  Cardiovascular:     Rate and Rhythm: Normal rate and regular rhythm.     Heart sounds: Normal heart sounds. No murmur heard. Pulmonary:     Effort: Pulmonary effort is normal.     Breath sounds: Normal breath sounds. No wheezing, rhonchi or rales.  Musculoskeletal:     Cervical back: Neck supple.  Skin:    General: Skin is warm.     Findings: No rash.  Neurological:     Mental Status: She is alert and oriented to person, place, and time.  Psychiatric:        Behavior: Behavior normal.  Previous notes and tests were reviewed. The plan was reviewed with the patient/family, and all questions/concerned were addressed.  It was my pleasure to see Kathleen Oliver today and participate in her care. Please feel free to contact me with any questions or concerns.  Sincerely,  Rexene Alberts, DO Allergy & Immunology  [facility] office: [place] office: [phone]

## 2021-06-12 NOTE — Assessment & Plan Note (Signed)
Uses albuterol only during URIs. Stopped Singulair for awhile with no worsening symptoms.  Stop Singulair (montelukast)  May use albuterol rescue inhaler 2 puffs every 4 to 6 hours as needed for shortness of breath, chest tightness, coughing, and wheezing. Monitor frequency of use.

## 2021-06-12 NOTE — Assessment & Plan Note (Signed)
•   Use OTC rewetting eye drops as needed.   Stop allergy eye drops as it can worsen the dryness.   Followed by an "eye doctor".

## 2021-06-12 NOTE — Assessment & Plan Note (Signed)
Past history - 2022 skin testing negative to indoor/outdoor allergens. Interim history - denies any significant nasal congestion or PND. Stopped Nasacort as ineffective. Only using Azelastine 1 spray per nostril in the morning as it causes too much dryness. Tried Atrovent in the past as well.  Stop Nasacort.   Use azelastine nasal spray 1-2 sprays per nostril twice a day as needed for runny nose/drainage.  Nasal saline spray (i.e., Simply Saline) or nasal saline lavage (i.e., NeilMed) is recommended as needed and prior to medicated nasal sprays.  Use a cool mist humidifier in the winter months in the bedroom.

## 2021-06-12 NOTE — Patient Instructions (Addendum)
Rhinitis: Stop Nasacort.  Use azelastine nasal spray 1-2 sprays per nostril twice a day as needed for runny nose/drainage. Nasal saline spray (i.e., Simply Saline) or nasal saline lavage (i.e., NeilMed) is recommended as needed and prior to medicated nasal sprays. Use a cool mist humidifier in the winter months in the bedroom.   Heartburn: See handout for lifestyle and dietary modifications. Continue famotidine 40 mg twice a day.  Keep follow up with GI.   Breathing: Stop Singulair (montelukast) May use albuterol rescue inhaler 2 puffs every 4 to 6 hours as needed for shortness of breath, chest tightness, coughing, and wheezing. Monitor frequency of use.   Eyes Use OTC rewetting eye drops as needed.   Follow up in 6 months or sooner if needed.

## 2021-06-12 NOTE — Assessment & Plan Note (Signed)
Followed by GI and had EGD which showed precancerous cells. Stopped PPI due to concerns for her bone. Takes famotidine 40mg  BID now.  See handout for lifestyle and dietary modifications.  Continue famotidine 40 mg twice a day.   Keep follow up with GI.

## 2021-06-18 DIAGNOSIS — M5451 Vertebrogenic low back pain: Secondary | ICD-10-CM | POA: Diagnosis not present

## 2021-06-21 ENCOUNTER — Other Ambulatory Visit: Payer: Self-pay | Admitting: Allergy and Immunology

## 2021-06-25 DIAGNOSIS — R300 Dysuria: Secondary | ICD-10-CM | POA: Diagnosis not present

## 2021-06-25 DIAGNOSIS — D500 Iron deficiency anemia secondary to blood loss (chronic): Secondary | ICD-10-CM | POA: Diagnosis not present

## 2021-08-06 DIAGNOSIS — E1140 Type 2 diabetes mellitus with diabetic neuropathy, unspecified: Secondary | ICD-10-CM | POA: Diagnosis not present

## 2021-08-06 DIAGNOSIS — K219 Gastro-esophageal reflux disease without esophagitis: Secondary | ICD-10-CM | POA: Diagnosis not present

## 2021-08-06 DIAGNOSIS — I10 Essential (primary) hypertension: Secondary | ICD-10-CM | POA: Diagnosis not present

## 2021-08-06 DIAGNOSIS — E7800 Pure hypercholesterolemia, unspecified: Secondary | ICD-10-CM | POA: Diagnosis not present

## 2021-08-10 DIAGNOSIS — H52221 Regular astigmatism, right eye: Secondary | ICD-10-CM | POA: Diagnosis not present

## 2021-09-07 DIAGNOSIS — E7800 Pure hypercholesterolemia, unspecified: Secondary | ICD-10-CM | POA: Diagnosis not present

## 2021-09-07 DIAGNOSIS — I10 Essential (primary) hypertension: Secondary | ICD-10-CM | POA: Diagnosis not present

## 2021-09-10 DIAGNOSIS — K31A13 Gastric intestinal metaplasia without dysplasia, involving the fundus: Secondary | ICD-10-CM | POA: Diagnosis not present

## 2021-09-10 DIAGNOSIS — K2940 Chronic atrophic gastritis without bleeding: Secondary | ICD-10-CM | POA: Diagnosis not present

## 2021-09-10 DIAGNOSIS — K2970 Gastritis, unspecified, without bleeding: Secondary | ICD-10-CM | POA: Diagnosis not present

## 2021-09-10 DIAGNOSIS — K31A14 Gastric intestinal metaplasia without dysplasia, involving the cardia: Secondary | ICD-10-CM | POA: Diagnosis not present

## 2021-09-10 DIAGNOSIS — K3189 Other diseases of stomach and duodenum: Secondary | ICD-10-CM | POA: Diagnosis not present

## 2021-09-10 DIAGNOSIS — K31A12 Gastric intestinal metaplasia without dysplasia, involving the body (corpus): Secondary | ICD-10-CM | POA: Diagnosis not present

## 2021-09-10 DIAGNOSIS — K2930 Chronic superficial gastritis without bleeding: Secondary | ICD-10-CM | POA: Diagnosis not present

## 2021-09-10 DIAGNOSIS — K31A19 Gastric intestinal metaplasia without dysplasia, unspecified site: Secondary | ICD-10-CM | POA: Diagnosis not present

## 2021-09-17 DIAGNOSIS — K2940 Chronic atrophic gastritis without bleeding: Secondary | ICD-10-CM | POA: Diagnosis not present

## 2021-09-17 DIAGNOSIS — K2930 Chronic superficial gastritis without bleeding: Secondary | ICD-10-CM | POA: Diagnosis not present

## 2021-09-17 DIAGNOSIS — K31A19 Gastric intestinal metaplasia without dysplasia, unspecified site: Secondary | ICD-10-CM | POA: Diagnosis not present

## 2021-09-23 DIAGNOSIS — I700 Atherosclerosis of aorta: Secondary | ICD-10-CM | POA: Diagnosis not present

## 2021-09-23 DIAGNOSIS — I10 Essential (primary) hypertension: Secondary | ICD-10-CM | POA: Diagnosis not present

## 2021-09-23 DIAGNOSIS — H353131 Nonexudative age-related macular degeneration, bilateral, early dry stage: Secondary | ICD-10-CM | POA: Diagnosis not present

## 2021-09-23 DIAGNOSIS — E1140 Type 2 diabetes mellitus with diabetic neuropathy, unspecified: Secondary | ICD-10-CM | POA: Diagnosis not present

## 2021-09-23 DIAGNOSIS — R911 Solitary pulmonary nodule: Secondary | ICD-10-CM | POA: Diagnosis not present

## 2021-09-23 DIAGNOSIS — R072 Precordial pain: Secondary | ICD-10-CM | POA: Diagnosis not present

## 2021-09-23 DIAGNOSIS — B372 Candidiasis of skin and nail: Secondary | ICD-10-CM | POA: Diagnosis not present

## 2021-09-23 DIAGNOSIS — E7800 Pure hypercholesterolemia, unspecified: Secondary | ICD-10-CM | POA: Diagnosis not present

## 2021-09-29 ENCOUNTER — Other Ambulatory Visit: Payer: Self-pay | Admitting: Geriatric Medicine

## 2021-09-29 DIAGNOSIS — R911 Solitary pulmonary nodule: Secondary | ICD-10-CM

## 2021-10-05 ENCOUNTER — Ambulatory Visit
Admission: RE | Admit: 2021-10-05 | Discharge: 2021-10-05 | Disposition: A | Discharge status: Home / Self Care | Payer: Medicare HMO | Source: Ambulatory Visit | Attending: Geriatric Medicine | Admitting: Geriatric Medicine

## 2021-10-05 DIAGNOSIS — R911 Solitary pulmonary nodule: Secondary | ICD-10-CM | POA: Diagnosis not present

## 2021-10-21 ENCOUNTER — Ambulatory Visit: Payer: Medicare HMO | Admitting: Interventional Cardiology

## 2021-10-21 ENCOUNTER — Encounter: Payer: Self-pay | Admitting: Interventional Cardiology

## 2021-10-21 VITALS — BP 118/58 | HR 80 | Ht 61.0 in | Wt 135.0 lb

## 2021-10-21 DIAGNOSIS — I2584 Coronary atherosclerosis due to calcified coronary lesion: Secondary | ICD-10-CM | POA: Diagnosis not present

## 2021-10-21 DIAGNOSIS — E782 Mixed hyperlipidemia: Secondary | ICD-10-CM

## 2021-10-21 DIAGNOSIS — R0602 Shortness of breath: Secondary | ICD-10-CM | POA: Diagnosis not present

## 2021-10-21 DIAGNOSIS — I2510 Atherosclerotic heart disease of native coronary artery without angina pectoris: Secondary | ICD-10-CM

## 2021-10-21 DIAGNOSIS — I10 Essential (primary) hypertension: Secondary | ICD-10-CM | POA: Diagnosis not present

## 2021-10-21 DIAGNOSIS — E1159 Type 2 diabetes mellitus with other circulatory complications: Secondary | ICD-10-CM | POA: Diagnosis not present

## 2021-10-21 NOTE — Patient Instructions (Signed)

## 2021-10-21 NOTE — Progress Notes (Signed)
?  ?Cardiology Office Note ? ? ?Date:  10/21/2021  ? ?ID:  Kathleen Oliver, DOB September 10, 1945, MRN [id number] ? ?PCP:  Kathleen Manes, MD  ? ? ?Chief Complaint  ?Patient presents with  ? Establish Care  ? ? ? ?Wt Readings from Last 3 Encounters:  ?10/21/21 135 lb (61.2 kg)  ?06/12/21 142 lb 3.2 oz (64.5 kg)  ?03/06/21 143 lb 9.6 oz (65.1 kg)  ?  ? ?  ?History of Present Illness: ?Kathleen Oliver is a 76 y.o. female who is being seen today for the evaluation of RF cor CAD at the request of Kathleen Manes, MD.  ?  ?I saw her many years ago.  She had risk factors for coronary artery disease as well as palpitations consistent with PVCs.  She had a cardiac cath in 2011 which did not show any significant coronary artery disease.  There is no aortic aneurysm or renal artery stenosis at that time. ? ?She had difficulty tolerating statins in the past. ? ?2014 echo showed normal LV function with normal valvular function. ? ? ?Past Medical History:  ?Diagnosis Date  ? Bronchial asthma   ? Chronic bronchitis ([place])   ? "numerous years; not q yr"   ? Complication of anesthesia 1999  ? "had 3 seizures & ended up in ICU days after I'd had anesthesia"   ? Diverticulosis of colon   ? Dysrhythmia   ? PVC's  ? GERD (gastroesophageal reflux disease)   ? History of hiatal hernia   ? HTN (hypertension)   ? Hypercholesteremia   ? Hypertension   ? Obesity   ? Osteoarthrosis   ? Pneumonia 2000's X 1  ? Recurrent UTI   ? Seizures ([place]) 1999 X 3  ? "days after anesthesia"- no further issues  ? Stroke [facility])   ? /MRI from 1999;; showed I'd had had several small ones"-no residual effects.  ? Type II diabetes mellitus ([place])   ? ? ?Past Surgical History:  ?Procedure Laterality Date  ? ABDOMINAL HERNIA REPAIR  2001 X 2  ? APPENDECTOMY  ~ 2000  ? BIOPSY THYROID    ? BLADDER SUSPENSION  2000's  ? CARDIAC CATHETERIZATION  1990's? ; 11/2009  ? CARPAL TUNNEL RELEASE Bilateral ~ 2003  ? CATARACT EXTRACTION W/ INTRAOCULAR LENS  IMPLANT, BILATERAL Bilateral ~ 2012   ? CHOLECYSTECTOMY OPEN  1984  ? COLECTOMY  ~ 2000  ? "perforated colon due to diverticulitis"  ? COLONOSCOPY  04/22/2021  ? COLONOSCOPY WITH PROPOFOL N/A 02/18/2015  ? Procedure: COLONOSCOPY WITH PROPOFOL;  Surgeon: Garlan Fair, MD;  Location: [facility];  Service: Endoscopy;  Laterality: N/A;  ? COLOSTOMY  ~ 2000  ? COLOSTOMY REVERSAL  ~ 2000  ? "only needed it 9 months"  ? ESOPHAGEAL DILATION  2000's X 1  ? HERNIA REPAIR    ? SHOULDER ARTHROSCOPY W/ ROTATOR CUFF REPAIR Right   ? x2 -scope '07  ? TONSILLECTOMY AND ADENOIDECTOMY  1950's  ? TOTAL ABDOMINAL HYSTERECTOMY  2004  ? TOTAL KNEE ARTHROPLASTY Right 02/24/2018  ? TOTAL KNEE ARTHROPLASTY Right 02/24/2018  ? Procedure: RIGHT TOTAL KNEE ARTHROPLASTY;  Surgeon: Netta Cedars, MD;  Location: [facility];  Service: Orthopedics;  Laterality: Right;  ? TRIGGER FINGER RELEASE Right 10/2009  ? "ring finger"  ? TRIGGER FINGER RELEASE Right 04/2014  ? "index & pinky"  ? UPPER GI ENDOSCOPY  04/22/2021  ? ? ? ?Current Outpatient Medications  ?Medication Sig Dispense Refill  ? albuterol (PROVENTIL HFA;VENTOLIN  HFA) 108 (90 BASE) MCG/ACT inhaler Inhale 2 puffs into the lungs every 6 (six) hours as needed for wheezing or shortness of breath.    ? amLODipine (NORVASC) 10 MG tablet Take 10 mg by mouth at bedtime.     ? azelastine (ASTELIN) 0.1 % nasal spray Place 1-2 sprays into both nostrils 2 (two) times daily as needed (nasal drainage). Use in each nostril as directed 30 mL 5  ? Cholecalciferol (VITAMIN D) 2000 UNITS CAPS Take 2,000 Units by mouth daily.    ? clopidogrel (PLAVIX) 75 MG tablet Take 75 mg by mouth at bedtime.     ? Evolocumab with Infusor ([place]) 420 MG/3.5ML SOCT Repatha Pushtronex 420 mg/3.5 mL subcutaneous wearable injector ? INJECT 3 & 1 2 (THREE & ONE HALF) ML ONCE EVERY 30 DAYS    ? Evolocumab with Infusor 420 MG/3.5ML SOCT Inject 420 mg into the skin every 30 (thirty) days.    ? famotidine (PEPCID) 40 MG tablet Take 1 tablet (40 mg  total) by mouth daily. 30 tablet 5  ? gabapentin (NEURONTIN) 100 MG capsule Take 100 mg by mouth at bedtime.    ? glimepiride (AMARYL) 2 MG tablet Take 1 mg by mouth 1 day or 1 dose.    ? latanoprost (XALATAN) 0.005 % ophthalmic solution Place 1 drop into both eyes at bedtime.     ? lidocaine (LMX) 4 % cream Apply 1 application topically as needed (pain).    ? Lidocaine 4 % PTCH Apply 1 patch topically daily as needed (pain).    ? loperamide (IMODIUM) 1 MG/5ML solution Take 1 mg by mouth as needed for diarrhea or loose stools.    ? losartan (COZAAR) 50 MG tablet Take 1 tablet (50 mg total) by mouth daily. And check Blood Pressure (Patient taking differently: Take 100 mg by mouth daily. And check Blood Pressure) 30 tablet 5  ? metFORMIN (GLUCOPHAGE-XR) 500 MG 24 hr tablet Take 500 mg by mouth daily with breakfast.    ? methocarbamol (ROBAXIN) 500 MG tablet Take 1 tablet (500 mg total) by mouth 3 (three) times daily as needed. 40 tablet 1  ? Mouthwashes (BIOTENE DRY MOUTH GENTLE) LIQD Use as directed 15 mLs in the mouth or throat 3 (three) times daily as needed. 475 mL 5  ? Polyethyl Glycol-Propyl Glycol (SYSTANE) 0.4-0.3 % GEL ophthalmic gel Place 1 application into both eyes at bedtime. 10 mL 5  ? Propylene Glycol (SYSTANE COMPLETE) 0.6 % SOLN Apply 1 drop to eye 4 (four) times daily as needed. 15 mL 5  ? Simethicone (GAS-X PO) Take 1 capsule by mouth daily as needed (abdominal pain).    ? sodium chloride (OCEAN) 0.65 % SOLN nasal spray Place 1 spray into both nostrils daily as needed for congestion.     ? traMADol (ULTRAM) 50 MG tablet Take 50 mg by mouth every 6 (six) hours as needed (Back pain.).     ? ?No current facility-administered medications for this visit.  ? ? ?Allergies:   Tizanidine, Cinobac [cinoxacin], Codeine, and Penicillins  ? ? ?Social History:  The patient  reports that she quit smoking about 39 years ago. Her smoking use included cigarettes. She has a 40.00 pack-year smoking history. She has  never used smokeless tobacco. She reports current alcohol use. She reports that she does not use drugs.  ? ?Family History:  The patient's family history includes Arthritis in her daughter; Diabetes in her brother; Diverticulitis in her mother; Heart attack  in her brother and father; Hypertension in her mother and sister.  ? ? ?ROS:  Please see the history of present illness.   Otherwise, review of systems are positive for back pain.   All other systems are reviewed and negative.  ? ? ?PHYSICAL EXAM: ?VS:  Ht '5\' 1"'$  (1.549 m)   Wt 135 lb (61.2 kg)   BMI 25.51 kg/m?  , BMI Body mass index is 25.51 kg/m?. ?GEN: Well nourished, well developed, in no acute distress ?HEENT: normal ?Neck: no JVD, carotid bruits, or masses ?Cardiac: RRR; no murmurs, rubs, or gallops,no edema  ?Respiratory:  clear to auscultation bilaterally, normal work of breathing ?GI: soft, nontender, nondistended, + BS ?MS: no deformity or atrophy ?Skin: warm and dry, no rash ?Neuro:  Strength and sensation are intact ?Psych: euthymic mood, full affect ? ? ?EKG:   ?The ekg ordered today demonstrates NSR, no ST changes ? ? ?Recent Labs: ?No results found for requested labs within last 8760 hours.  ? ?Lipid Panel ?No results found for: CHOL, TRIG, HDL, CHOLHDL, VLDL, LDLCALC, LDLDIRECT ?  ?Other studies Reviewed: ?Additional studies/ records that were reviewed today with results demonstrating: 2014 echo showed normal LV function with normal valvular function.. ? ? ?ASSESSMENT AND PLAN: ? ?Shortness of breath: Likely multifactorial.  Deconditioned due to back problems.  ?Coronary calcification: Calcium score 0 in 2021.  Noted on CT chest in 2023.  Risk factor modification.   LDL around 100.  ?HTN: The current medical regimen is effective;  continue present plan and medications. ?DM: A1C 7.3 in 2023.  Whole food, plant-based diet.  High-fiber diet.  Avoid processed food. ?PVCs: mild sx. ?Obesity: resolved.  She has lost a lot of weight.   ?Hypercholesterolemia: statin intolerant.  Taking repatha.  Mildly elevated CPK.  Followed by Dr. Felipa Eth. ? ? ?Current medicines are reviewed at length with the patient today.  The patient concerns regarding her medic

## 2021-11-04 DIAGNOSIS — E7800 Pure hypercholesterolemia, unspecified: Secondary | ICD-10-CM | POA: Diagnosis not present

## 2021-11-04 DIAGNOSIS — K219 Gastro-esophageal reflux disease without esophagitis: Secondary | ICD-10-CM | POA: Diagnosis not present

## 2021-11-04 DIAGNOSIS — E1140 Type 2 diabetes mellitus with diabetic neuropathy, unspecified: Secondary | ICD-10-CM | POA: Diagnosis not present

## 2021-11-04 DIAGNOSIS — I10 Essential (primary) hypertension: Secondary | ICD-10-CM | POA: Diagnosis not present

## 2021-11-09 DIAGNOSIS — Z1231 Encounter for screening mammogram for malignant neoplasm of breast: Secondary | ICD-10-CM | POA: Diagnosis not present

## 2021-12-13 NOTE — Progress Notes (Unsigned)
Follow Up Note  RE: Kathleen Oliver MRN: [id number] DOB: 20-Jan-1946 Date of Office Visit: 12/14/2021  Referring provider: [name], MD Primary care provider: [name], MD  Chief Complaint: No chief complaint on file.  History of Present Illness: I had the pleasure of seeing Kathleen Oliver for a follow up visit at the [facility] on 12/13/2021. She is a 76 y.o. female, who is being followed for nonallergic rhinitis, dry eyes, GERD and reactive airway disease. Her previous allergy office visit was on 06/12/2021 with Dr. Maudie Mercury. Today is a regular follow up visit.  Nonallergic rhinitis Past history - 2022 skin testing negative to indoor/outdoor allergens. Interim history - denies any significant nasal congestion or PND. Stopped Nasacort as ineffective. Only using Azelastine 1 spray per nostril in the morning as it causes too much dryness. Tried Atrovent in the past as well. Stop Nasacort.  Use azelastine nasal spray 1-2 sprays per nostril twice a day as needed for runny nose/drainage. Nasal saline spray (i.e., Simply Saline) or nasal saline lavage (i.e., NeilMed) is recommended as needed and prior to medicated nasal sprays. Use a cool mist humidifier in the winter months in the bedroom.   Dry eyes Use OTC rewetting eye drops as needed.  Stop allergy eye drops as it can worsen the dryness.  Followed by an "eye doctor".   GERD (gastroesophageal reflux disease) Followed by GI and had EGD which showed precancerous cells. Stopped PPI due to concerns for her bone. Takes famotidine '40mg'$  BID now. See handout for lifestyle and dietary modifications. Continue famotidine 40 mg twice a day.  Keep follow up with GI.   Reactive airway disease without complication Uses albuterol only during URIs. Stopped Singulair for awhile with no worsening symptoms. Stop Singulair (montelukast) May use albuterol rescue inhaler 2 puffs every 4 to 6 hours as needed for shortness of  breath, chest tightness, coughing, and wheezing. Monitor frequency of use.   Return in about 6 months (around 12/11/2021).  Assessment and Plan: Kathleen Oliver is a 76 y.o. female with: No problem-specific Assessment & Plan notes found for this encounter.  No follow-ups on file.  No orders of the defined types were placed in this encounter.  Lab Orders  No laboratory test(s) ordered today    Diagnostics: Spirometry:  Tracings reviewed. Her effort: {Blank single:19197::"Good reproducible efforts.","It was hard to get consistent efforts and there is a question as to whether this reflects a maximal maneuver.","Poor effort, data can not be interpreted."} FVC: ***L FEV1: ***L, ***% predicted FEV1/FVC ratio: ***% Interpretation: {Blank single:19197::"Spirometry consistent with mild obstructive disease","Spirometry consistent with moderate obstructive disease","Spirometry consistent with severe obstructive disease","Spirometry consistent with possible restrictive disease","Spirometry consistent with mixed obstructive and restrictive disease","Spirometry uninterpretable due to technique","Spirometry consistent with normal pattern","No overt abnormalities noted given today's efforts"}.  Please see scanned spirometry results for details.  Skin Testing: {Blank single:19197::"Select foods","Environmental allergy panel","Environmental allergy panel and select foods","Food allergy panel","None","Deferred due to recent antihistamines use"}. *** Results discussed with patient/family.   Medication List:  Current Outpatient Medications  Medication Sig Dispense Refill   albuterol (PROVENTIL HFA;VENTOLIN HFA) 108 (90 BASE) MCG/ACT inhaler Inhale 2 puffs into the lungs every 6 (six) hours as needed for wheezing or shortness of breath.     amLODipine (NORVASC) 10 MG tablet Take 10 mg by mouth at bedtime.      azelastine (ASTELIN) 0.1 % nasal spray Place 1-2 sprays into both nostrils 2 (two) times daily as  needed (nasal drainage).  Use in each nostril as directed 30 mL 5   Cholecalciferol (VITAMIN D) 2000 UNITS CAPS Take 2,000 Units by mouth daily.     clopidogrel (PLAVIX) 75 MG tablet Take 75 mg by mouth at bedtime.      Evolocumab with Infusor ([place]) 420 MG/3.5ML SOCT Repatha Pushtronex 420 mg/3.5 mL subcutaneous wearable injector  INJECT 3 & 1 2 (THREE & ONE HALF) ML ONCE EVERY 30 DAYS     Evolocumab with Infusor 420 MG/3.5ML SOCT Inject 420 mg into the skin every 30 (thirty) days.     famotidine (PEPCID) 40 MG tablet Take 1 tablet (40 mg total) by mouth daily. 30 tablet 5   gabapentin (NEURONTIN) 100 MG capsule Take 100 mg by mouth at bedtime.     glimepiride (AMARYL) 2 MG tablet Take 1 mg by mouth 1 day or 1 dose.     latanoprost (XALATAN) 0.005 % ophthalmic solution Place 1 drop into both eyes at bedtime.      lidocaine (LMX) 4 % cream Apply 1 application topically as needed (pain).     Lidocaine 4 % PTCH Apply 1 patch topically daily as needed (pain).     loperamide (IMODIUM) 1 MG/5ML solution Take 1 mg by mouth as needed for diarrhea or loose stools.     losartan (COZAAR) 50 MG tablet Take 1 tablet (50 mg total) by mouth daily. And check Blood Pressure (Patient taking differently: Take 100 mg by mouth daily. And check Blood Pressure) 30 tablet 5   metFORMIN (GLUCOPHAGE-XR) 500 MG 24 hr tablet Take 500 mg by mouth daily with breakfast.     methocarbamol (ROBAXIN) 500 MG tablet Take 1 tablet (500 mg total) by mouth 3 (three) times daily as needed. 40 tablet 1   Mouthwashes (BIOTENE DRY MOUTH GENTLE) LIQD Use as directed 15 mLs in the mouth or throat 3 (three) times daily as needed. 475 mL 5   Polyethyl Glycol-Propyl Glycol (SYSTANE) 0.4-0.3 % GEL ophthalmic gel Place 1 application into both eyes at bedtime. 10 mL 5   Propylene Glycol (SYSTANE COMPLETE) 0.6 % SOLN Apply 1 drop to eye 4 (four) times daily as needed. 15 mL 5   Simethicone (GAS-X PO) Take 1 capsule by mouth  daily as needed (abdominal pain).     sodium chloride (OCEAN) 0.65 % SOLN nasal spray Place 1 spray into both nostrils daily as needed for congestion.      traMADol (ULTRAM) 50 MG tablet Take 50 mg by mouth every 6 (six) hours as needed (Back pain.).      No current facility-administered medications for this visit.   Allergies: Allergies  Allergen Reactions   Tizanidine Other (See Comments)    Heart flutter    Cinobac [Cinoxacin]     hives   Codeine Itching   Penicillins Hives    Has patient had a PCN reaction causing immediate rash, facial/tongue/throat swelling, SOB or lightheadedness with hypotension: Yes Has patient had a PCN reaction causing severe rash involving mucus membranes or skin necrosis: Yes Has patient had a PCN reaction that required hospitalization No Has patient had a PCN reaction occurring within the last 10 years: No If all of the above answers are "NO", then may proceed with Cephalosporin use.     I reviewed her past medical history, social history, family history, and environmental history and no significant changes have been reported from her previous visit.  Review of Systems  Constitutional:  Negative for appetite change, chills, fever and  unexpected weight change.  HENT:  Negative for congestion, postnasal drip and rhinorrhea.   Eyes:  Negative for itching.       Dry eyes  Respiratory:  Negative for cough, chest tightness, shortness of breath and wheezing.   Gastrointestinal:  Negative for abdominal pain.  Musculoskeletal:  Positive for back pain.  Skin:  Negative for rash.  Allergic/Immunologic: Negative for environmental allergies.  Neurological:  Negative for headaches.    Objective: There were no vitals taken for this visit. There is no height or weight on file to calculate BMI. Physical Exam Vitals and nursing note reviewed.  Constitutional:      Appearance: Normal appearance. She is well-developed.  HENT:     Head: Normocephalic and  atraumatic.     Right Ear: Tympanic membrane and external ear normal.     Left Ear: Tympanic membrane and external ear normal.     Nose: Nose normal.     Mouth/Throat:     Mouth: Mucous membranes are moist.     Pharynx: Oropharynx is clear.  Eyes:     Conjunctiva/sclera: Conjunctivae normal.  Cardiovascular:     Rate and Rhythm: Normal rate and regular rhythm.     Heart sounds: Normal heart sounds. No murmur heard. Pulmonary:     Effort: Pulmonary effort is normal.     Breath sounds: Normal breath sounds. No wheezing, rhonchi or rales.  Musculoskeletal:     Cervical back: Neck supple.  Skin:    General: Skin is warm.     Findings: No rash.  Neurological:     Mental Status: She is alert and oriented to person, place, and time.  Psychiatric:        Behavior: Behavior normal.    Previous notes and tests were reviewed. The plan was reviewed with the patient/family, and all questions/concerned were addressed.  It was my pleasure to see Kathleen Oliver today and participate in her care. Please feel free to contact me with any questions or concerns.  Sincerely,  Rexene Alberts, DO Allergy & Immunology  [facility] office: [place] office: [phone]

## 2021-12-14 ENCOUNTER — Other Ambulatory Visit: Payer: Self-pay

## 2021-12-14 ENCOUNTER — Encounter: Payer: Self-pay | Admitting: Allergy

## 2021-12-14 ENCOUNTER — Ambulatory Visit: Payer: Medicare HMO | Admitting: Allergy

## 2021-12-14 VITALS — BP 120/76 | HR 81 | Temp 98.1°F | Resp 17

## 2021-12-14 DIAGNOSIS — J4520 Mild intermittent asthma, uncomplicated: Secondary | ICD-10-CM

## 2021-12-14 DIAGNOSIS — H6123 Impacted cerumen, bilateral: Secondary | ICD-10-CM | POA: Insufficient documentation

## 2021-12-14 DIAGNOSIS — H6121 Impacted cerumen, right ear: Secondary | ICD-10-CM

## 2021-12-14 DIAGNOSIS — K219 Gastro-esophageal reflux disease without esophagitis: Secondary | ICD-10-CM

## 2021-12-14 DIAGNOSIS — H04123 Dry eye syndrome of bilateral lacrimal glands: Secondary | ICD-10-CM

## 2021-12-14 DIAGNOSIS — J310 Chronic rhinitis: Secondary | ICD-10-CM | POA: Diagnosis not present

## 2021-12-14 DIAGNOSIS — J45909 Unspecified asthma, uncomplicated: Secondary | ICD-10-CM

## 2021-12-14 MED ORDER — ALBUTEROL SULFATE HFA 108 (90 BASE) MCG/ACT IN AERS: 2.0000 | INHALATION_SPRAY | RESPIRATORY_TRACT | 1 refills | Status: DC | PRN

## 2021-12-14 MED ORDER — AZELASTINE HCL 0.1 % NA SOLN: 1.0000 | Freq: Two times a day (BID) | NASAL | 5 refills | Status: DC | PRN

## 2021-12-14 NOTE — Assessment & Plan Note (Signed)
Past history - Followed by GI and had EGD which showed precancerous cells. Stopped PPI due to concerns for her bone. Takes famotidine '40mg'$  BID now.  Continue lifestyle and dietary modifications.  Continue famotidine 40 mg as prescribed by GI.   Keep follow up with GI.

## 2021-12-14 NOTE — Assessment & Plan Note (Signed)
.   Use OTC rewetting eye drops as needed.

## 2021-12-14 NOTE — Patient Instructions (Addendum)
Rhinitis: Use azelastine nasal spray 1-2 sprays per nostril twice a day as needed for runny nose/drainage. Nasal saline spray (i.e., Simply Saline) or nasal saline lavage (i.e., NeilMed) is recommended as needed and prior to medicated nasal sprays. Use a cool mist humidifier in the winter months in the bedroom.   Heartburn: Continue lifestyle and dietary modifications. Continue famotidine 40 mg as prescribed by GI.  Keep follow up with GI.   Breathing: May use albuterol rescue inhaler 2 puffs every 4 to 6 hours as needed for shortness of breath, chest tightness, coughing, and wheezing. Monitor frequency of use.   Eyes Use OTC rewetting eye drops as needed.   Earwax  Use over the counter debrox 5-10 drops twice a day for up to 4 days in a row to soften the earwax.   Follow up in 6 months or sooner if needed.

## 2021-12-14 NOTE — Assessment & Plan Note (Signed)
Past history - 2022 skin testing negative to indoor/outdoor allergens. Tried Atrovent. Interim history - controlled with below regimen. . Use azelastine nasal spray 1-2 sprays per nostril twice a day as needed for runny nose/drainage. . Nasal saline spray (i.e., Simply Saline) or nasal saline lavage (i.e., NeilMed) is recommended as needed and prior to medicated nasal sprays. . Use a cool mist humidifier in the winter months in the bedroom.

## 2021-12-14 NOTE — Assessment & Plan Note (Signed)
Past history - Uses albuterol only during URIs. Stopped Singulair for awhile with no worsening symptoms. Interim history - asymptomatic.  . May use albuterol rescue inhaler 2 puffs every 4 to 6 hours as needed for shortness of breath, chest tightness, coughing, and wheezing. Monitor frequency of use.

## 2021-12-14 NOTE — Assessment & Plan Note (Signed)
.   Use over the counter debrox 5-10 drops twice a day for up to 4 days in a row to soften the earwax.

## 2022-01-12 DIAGNOSIS — R21 Rash and other nonspecific skin eruption: Secondary | ICD-10-CM | POA: Diagnosis not present

## 2022-01-12 DIAGNOSIS — K219 Gastro-esophageal reflux disease without esophagitis: Secondary | ICD-10-CM | POA: Diagnosis not present

## 2022-01-12 DIAGNOSIS — I10 Essential (primary) hypertension: Secondary | ICD-10-CM | POA: Diagnosis not present

## 2022-01-25 DIAGNOSIS — K219 Gastro-esophageal reflux disease without esophagitis: Secondary | ICD-10-CM | POA: Diagnosis not present

## 2022-01-25 DIAGNOSIS — R1010 Upper abdominal pain, unspecified: Secondary | ICD-10-CM | POA: Diagnosis not present

## 2022-01-25 DIAGNOSIS — K2940 Chronic atrophic gastritis without bleeding: Secondary | ICD-10-CM | POA: Diagnosis not present

## 2022-02-09 DIAGNOSIS — I10 Essential (primary) hypertension: Secondary | ICD-10-CM | POA: Diagnosis not present

## 2022-02-09 DIAGNOSIS — E1140 Type 2 diabetes mellitus with diabetic neuropathy, unspecified: Secondary | ICD-10-CM | POA: Diagnosis not present

## 2022-02-09 DIAGNOSIS — E7800 Pure hypercholesterolemia, unspecified: Secondary | ICD-10-CM | POA: Diagnosis not present

## 2022-02-09 DIAGNOSIS — K219 Gastro-esophageal reflux disease without esophagitis: Secondary | ICD-10-CM | POA: Diagnosis not present

## 2022-02-11 DIAGNOSIS — H353131 Nonexudative age-related macular degeneration, bilateral, early dry stage: Secondary | ICD-10-CM | POA: Diagnosis not present

## 2022-02-11 DIAGNOSIS — Z961 Presence of intraocular lens: Secondary | ICD-10-CM | POA: Diagnosis not present

## 2022-02-11 DIAGNOSIS — H26493 Other secondary cataract, bilateral: Secondary | ICD-10-CM | POA: Diagnosis not present

## 2022-03-02 DIAGNOSIS — H6121 Impacted cerumen, right ear: Secondary | ICD-10-CM | POA: Diagnosis not present

## 2022-03-22 DIAGNOSIS — M65342 Trigger finger, left ring finger: Secondary | ICD-10-CM | POA: Diagnosis not present

## 2022-03-22 DIAGNOSIS — M79645 Pain in left finger(s): Secondary | ICD-10-CM | POA: Diagnosis not present

## 2022-03-24 DIAGNOSIS — K31A11 Gastric intestinal metaplasia without dysplasia, involving the antrum: Secondary | ICD-10-CM | POA: Diagnosis not present

## 2022-03-24 DIAGNOSIS — R109 Unspecified abdominal pain: Secondary | ICD-10-CM | POA: Diagnosis not present

## 2022-03-29 DIAGNOSIS — K219 Gastro-esophageal reflux disease without esophagitis: Secondary | ICD-10-CM | POA: Diagnosis not present

## 2022-03-29 DIAGNOSIS — E559 Vitamin D deficiency, unspecified: Secondary | ICD-10-CM | POA: Diagnosis not present

## 2022-03-29 DIAGNOSIS — Z23 Encounter for immunization: Secondary | ICD-10-CM | POA: Diagnosis not present

## 2022-03-29 DIAGNOSIS — R300 Dysuria: Secondary | ICD-10-CM | POA: Diagnosis not present

## 2022-03-29 DIAGNOSIS — E1140 Type 2 diabetes mellitus with diabetic neuropathy, unspecified: Secondary | ICD-10-CM | POA: Diagnosis not present

## 2022-03-29 DIAGNOSIS — H353131 Nonexudative age-related macular degeneration, bilateral, early dry stage: Secondary | ICD-10-CM | POA: Diagnosis not present

## 2022-03-29 DIAGNOSIS — I700 Atherosclerosis of aorta: Secondary | ICD-10-CM | POA: Diagnosis not present

## 2022-03-29 DIAGNOSIS — I10 Essential (primary) hypertension: Secondary | ICD-10-CM | POA: Diagnosis not present

## 2022-03-29 DIAGNOSIS — E669 Obesity, unspecified: Secondary | ICD-10-CM | POA: Diagnosis not present

## 2022-03-29 DIAGNOSIS — Z1331 Encounter for screening for depression: Secondary | ICD-10-CM | POA: Diagnosis not present

## 2022-03-29 DIAGNOSIS — E7800 Pure hypercholesterolemia, unspecified: Secondary | ICD-10-CM | POA: Diagnosis not present

## 2022-03-29 DIAGNOSIS — E1142 Type 2 diabetes mellitus with diabetic polyneuropathy: Secondary | ICD-10-CM | POA: Diagnosis not present

## 2022-03-29 DIAGNOSIS — Z0000 Encounter for general adult medical examination without abnormal findings: Secondary | ICD-10-CM | POA: Diagnosis not present

## 2022-04-02 DIAGNOSIS — E041 Nontoxic single thyroid nodule: Secondary | ICD-10-CM | POA: Diagnosis not present

## 2022-04-12 DIAGNOSIS — K6289 Other specified diseases of anus and rectum: Secondary | ICD-10-CM | POA: Diagnosis not present

## 2022-04-22 DIAGNOSIS — E7800 Pure hypercholesterolemia, unspecified: Secondary | ICD-10-CM | POA: Diagnosis not present

## 2022-04-22 DIAGNOSIS — I10 Essential (primary) hypertension: Secondary | ICD-10-CM | POA: Diagnosis not present

## 2022-04-22 DIAGNOSIS — E1142 Type 2 diabetes mellitus with diabetic polyneuropathy: Secondary | ICD-10-CM | POA: Diagnosis not present

## 2022-04-22 DIAGNOSIS — K219 Gastro-esophageal reflux disease without esophagitis: Secondary | ICD-10-CM | POA: Diagnosis not present

## 2022-05-04 DIAGNOSIS — K219 Gastro-esophageal reflux disease without esophagitis: Secondary | ICD-10-CM | POA: Diagnosis not present

## 2022-05-04 DIAGNOSIS — I10 Essential (primary) hypertension: Secondary | ICD-10-CM | POA: Diagnosis not present

## 2022-05-04 DIAGNOSIS — E1142 Type 2 diabetes mellitus with diabetic polyneuropathy: Secondary | ICD-10-CM | POA: Diagnosis not present

## 2022-05-04 DIAGNOSIS — E1140 Type 2 diabetes mellitus with diabetic neuropathy, unspecified: Secondary | ICD-10-CM | POA: Diagnosis not present

## 2022-05-04 DIAGNOSIS — E7800 Pure hypercholesterolemia, unspecified: Secondary | ICD-10-CM | POA: Diagnosis not present

## 2022-05-17 DIAGNOSIS — M545 Low back pain, unspecified: Secondary | ICD-10-CM | POA: Diagnosis not present

## 2022-05-27 DIAGNOSIS — M65342 Trigger finger, left ring finger: Secondary | ICD-10-CM | POA: Diagnosis not present

## 2022-05-27 DIAGNOSIS — M79641 Pain in right hand: Secondary | ICD-10-CM | POA: Diagnosis not present

## 2022-05-27 DIAGNOSIS — M65311 Trigger thumb, right thumb: Secondary | ICD-10-CM | POA: Diagnosis not present

## 2022-05-27 DIAGNOSIS — M65849 Other synovitis and tenosynovitis, unspecified hand: Secondary | ICD-10-CM | POA: Diagnosis not present

## 2022-05-30 DIAGNOSIS — M533 Sacrococcygeal disorders, not elsewhere classified: Secondary | ICD-10-CM | POA: Diagnosis not present

## 2022-05-31 DIAGNOSIS — Z4789 Encounter for other orthopedic aftercare: Secondary | ICD-10-CM | POA: Diagnosis not present

## 2022-05-31 DIAGNOSIS — M65342 Trigger finger, left ring finger: Secondary | ICD-10-CM | POA: Diagnosis not present

## 2022-06-14 DIAGNOSIS — M25642 Stiffness of left hand, not elsewhere classified: Secondary | ICD-10-CM | POA: Diagnosis not present

## 2022-06-15 NOTE — Progress Notes (Unsigned)
Follow Up Note  RE: Kathleen Oliver MRN: [id number] DOB: 1945/10/28 Date of Office Visit: 06/16/2022  Referring provider: [name], MD Primary care provider: [name], MD  Chief Complaint: No chief complaint on file.  History of Present Illness: I had the pleasure of seeing Kathleen Oliver for a follow up visit at the [facility] [place] on 06/15/2022. She is a 76 y.o. female, who is being followed for nonallergic rhinitis, dry eyes, GERD, reactive airway disease. Her previous allergy office visit was on 12/14/2021 with Dr. Maudie Mercury. Today is a regular follow up visit.  Nonallergic rhinitis Past history - 2022 skin testing negative to indoor/outdoor allergens. Tried Atrovent. Interim history - controlled with below regimen. Use azelastine nasal spray 1-2 sprays per nostril twice a day as needed for runny nose/drainage. Nasal saline spray (i.e., Simply Saline) or nasal saline lavage (i.e., NeilMed) is recommended as needed and prior to medicated nasal sprays. Use a cool mist humidifier in the winter months in the bedroom.    Dry eyes Use OTC rewetting eye drops as needed.    GERD (gastroesophageal reflux disease) Past history - Followed by GI and had EGD which showed precancerous cells. Stopped PPI due to concerns for her bone. Takes famotidine '40mg'$  BID now. Continue lifestyle and dietary modifications. Continue famotidine 40 mg as prescribed by GI.  Keep follow up with GI.    Reactive airway disease without complication Past history - Uses albuterol only during URIs. Stopped Singulair for awhile with no worsening symptoms. Interim history - asymptomatic.  May use albuterol rescue inhaler 2 puffs every 4 to 6 hours as needed for shortness of breath, chest tightness, coughing, and wheezing. Monitor frequency of use.   Impacted cerumen of right ear Use over the counter debrox 5-10 drops twice a day for up to 4 days in a row to soften the earwax.    Return in about 6  months (around 06/15/2022).  Assessment and Plan: Ly is a 76 y.o. female with: No problem-specific Assessment & Plan notes found for this encounter.  No follow-ups on file.  No orders of the defined types were placed in this encounter.  Lab Orders  No laboratory test(s) ordered today    Diagnostics: Spirometry:  Tracings reviewed. Her effort: {Blank single:19197::"Good reproducible efforts.","It was hard to get consistent efforts and there is a question as to whether this reflects a maximal maneuver.","Poor effort, data can not be interpreted."} FVC: ***L FEV1: ***L, ***% predicted FEV1/FVC ratio: ***% Interpretation: {Blank single:19197::"Spirometry consistent with mild obstructive disease","Spirometry consistent with moderate obstructive disease","Spirometry consistent with severe obstructive disease","Spirometry consistent with possible restrictive disease","Spirometry consistent with mixed obstructive and restrictive disease","Spirometry uninterpretable due to technique","Spirometry consistent with normal pattern","No overt abnormalities noted given today's efforts"}.  Please see scanned spirometry results for details.  Skin Testing: {Blank single:19197::"Select foods","Environmental allergy panel","Environmental allergy panel and select foods","Food allergy panel","None","Deferred due to recent antihistamines use"}. *** Results discussed with patient/family.   Medication List:  Current Outpatient Medications  Medication Sig Dispense Refill   ACCU-CHEK AVIVA PLUS test strip      albuterol (VENTOLIN HFA) 108 (90 Base) MCG/ACT inhaler Inhale 2 puffs into the lungs every 4 (four) hours as needed for wheezing or shortness of breath (coughing fits). 18 g 1   amLODipine (NORVASC) 5 MG tablet SMARTSIG:1 Tablet(s) By Mouth Every Evening     ANUCORT-HC 25 MG suppository Place 25 mg rectally daily as needed.     azelastine (ASTELIN) 0.1 % nasal  spray Place 1-2 sprays into both  nostrils 2 (two) times daily as needed (nasal drainage). Use in each nostril as directed 30 mL 5   Cholecalciferol (VITAMIN D) 2000 UNITS CAPS Take 2,000 Units by mouth daily.     clopidogrel (PLAVIX) 75 MG tablet Take 75 mg by mouth at bedtime.      Evolocumab with Infusor ([place]) 420 MG/3.5ML SOCT Repatha Pushtronex 420 mg/3.5 mL subcutaneous wearable injector  INJECT 3 & 1 2 (THREE & ONE HALF) ML ONCE EVERY 30 DAYS     Evolocumab with Infusor 420 MG/3.5ML SOCT Inject 420 mg into the skin every 30 (thirty) days.     famotidine (PEPCID) 40 MG tablet Take 1 tablet (40 mg total) by mouth daily. 30 tablet 5   gabapentin (NEURONTIN) 100 MG capsule Take 100 mg by mouth at bedtime.     glimepiride (AMARYL) 2 MG tablet Take 1 mg by mouth 1 day or 1 dose.     hydrochlorothiazide (HYDRODIURIL) 12.5 MG tablet Take 12.5 mg by mouth every morning.     ketoconazole (NIZORAL) 2 % cream Apply topically 2 (two) times daily.     latanoprost (XALATAN) 0.005 % ophthalmic solution Place 1 drop into both eyes at bedtime.      lidocaine (LMX) 4 % cream Apply 1 application topically as needed (pain).     Lidocaine 4 % PTCH Apply 1 patch topically daily as needed (pain).     loperamide (IMODIUM) 1 MG/5ML solution Take 1 mg by mouth as needed for diarrhea or loose stools.     losartan (COZAAR) 50 MG tablet Take 1 tablet (50 mg total) by mouth daily. And check Blood Pressure 30 tablet 5   metFORMIN (GLUCOPHAGE) 500 MG tablet Take 500 mg by mouth daily.     methocarbamol (ROBAXIN) 500 MG tablet Take 1 tablet (500 mg total) by mouth 3 (three) times daily as needed. 40 tablet 1   Mouthwashes (BIOTENE DRY MOUTH GENTLE) LIQD Use as directed 15 mLs in the mouth or throat 3 (three) times daily as needed. 475 mL 5   ondansetron (ZOFRAN-ODT) 4 MG disintegrating tablet Take 4 mg by mouth every 4 (four) hours as needed.     Polyethyl Glycol-Propyl Glycol (SYSTANE) 0.4-0.3 % GEL ophthalmic gel Place 1 application  into both eyes at bedtime. 10 mL 5   Propylene Glycol (SYSTANE COMPLETE) 0.6 % SOLN Apply 1 drop to eye 4 (four) times daily as needed. 15 mL 5   Simethicone (GAS-X PO) Take 1 capsule by mouth daily as needed (abdominal pain).     sodium chloride (OCEAN) 0.65 % SOLN nasal spray Place 1 spray into both nostrils daily as needed for congestion.      traMADol (ULTRAM) 50 MG tablet Take 50 mg by mouth every 6 (six) hours as needed (Back pain.).      No current facility-administered medications for this visit.   Allergies: Allergies  Allergen Reactions   Tizanidine Other (See Comments)    Heart flutter    Cinobac [Cinoxacin]     hives   Codeine Itching   Penicillins Hives    Has patient had a PCN reaction causing immediate rash, facial/tongue/throat swelling, SOB or lightheadedness with hypotension: Yes Has patient had a PCN reaction causing severe rash involving mucus membranes or skin necrosis: Yes Has patient had a PCN reaction that required hospitalization No Has patient had a PCN reaction occurring within the last 10 years: No If all of the above  answers are "NO", then may proceed with Cephalosporin use.     I reviewed her past medical history, social history, family history, and environmental history and no significant changes have been reported from her previous visit.  Review of Systems  Constitutional:  Negative for appetite change, chills, fever and unexpected weight change.  HENT:  Positive for postnasal drip and rhinorrhea. Negative for congestion.        Ear itching  Eyes:  Negative for itching.       Dry eyes  Respiratory:  Negative for cough, chest tightness, shortness of breath and wheezing.   Gastrointestinal:  Negative for abdominal pain.  Skin:  Negative for rash.  Allergic/Immunologic: Negative for environmental allergies.  Neurological:  Negative for headaches.    Objective: There were no vitals taken for this visit. There is no height or weight on file to  calculate BMI. Physical Exam Vitals and nursing note reviewed.  Constitutional:      Appearance: Normal appearance. She is well-developed.  HENT:     Head: Normocephalic and atraumatic.     Right Ear: External ear normal. There is impacted cerumen.     Left Ear: Tympanic membrane and external ear normal.     Nose: Nose normal.     Mouth/Throat:     Mouth: Mucous membranes are moist.     Pharynx: Oropharynx is clear.  Eyes:     Conjunctiva/sclera: Conjunctivae normal.  Cardiovascular:     Rate and Rhythm: Normal rate and regular rhythm.     Heart sounds: Normal heart sounds. No murmur heard. Pulmonary:     Effort: Pulmonary effort is normal.     Breath sounds: Normal breath sounds. No wheezing, rhonchi or rales.  Musculoskeletal:     Cervical back: Neck supple.  Skin:    General: Skin is warm.     Findings: No rash.  Neurological:     Mental Status: She is alert and oriented to person, place, and time.  Psychiatric:        Behavior: Behavior normal.    Previous notes and tests were reviewed. The plan was reviewed with the patient/family, and all questions/concerned were addressed.  It was my pleasure to see Rotha today and participate in her care. Please feel free to contact me with any questions or concerns.  Sincerely,  Rexene Alberts, DO Allergy & Immunology  [facility] office: [place] office: [phone]

## 2022-06-16 ENCOUNTER — Ambulatory Visit: Payer: Medicare HMO | Admitting: Allergy

## 2022-06-16 ENCOUNTER — Encounter: Payer: Self-pay | Admitting: Allergy

## 2022-06-16 ENCOUNTER — Other Ambulatory Visit: Payer: Self-pay

## 2022-06-16 VITALS — BP 132/70 | HR 60 | Temp 98.4°F | Resp 16 | Ht 61.0 in | Wt 136.9 lb

## 2022-06-16 DIAGNOSIS — H04123 Dry eye syndrome of bilateral lacrimal glands: Secondary | ICD-10-CM | POA: Diagnosis not present

## 2022-06-16 DIAGNOSIS — H6123 Impacted cerumen, bilateral: Secondary | ICD-10-CM | POA: Diagnosis not present

## 2022-06-16 DIAGNOSIS — K219 Gastro-esophageal reflux disease without esophagitis: Secondary | ICD-10-CM | POA: Diagnosis not present

## 2022-06-16 DIAGNOSIS — R001 Bradycardia, unspecified: Secondary | ICD-10-CM

## 2022-06-16 DIAGNOSIS — J310 Chronic rhinitis: Secondary | ICD-10-CM

## 2022-06-16 DIAGNOSIS — J45909 Unspecified asthma, uncomplicated: Secondary | ICD-10-CM

## 2022-06-16 MED ORDER — AIRSUPRA 90-80 MCG/ACT IN AERO: 2.0000 | INHALATION_SPRAY | RESPIRATORY_TRACT | 2 refills | Status: DC | PRN

## 2022-06-16 NOTE — Assessment & Plan Note (Signed)
Mentions HR going to the 50s at times at home and feeling lousy. HR in office was 60 today. Follow up with PCP regarding the heart rate.

## 2022-06-16 NOTE — Assessment & Plan Note (Signed)
Past history - Uses albuterol only during URIs. Stopped Singulair for awhile with no worsening symptoms. Interim history - asymptomatic.  Today's spirometry was normal.  May use Airsupra rescue inhaler 2 puffs every 4 to 6 hours as needed for shortness of breath, chest tightness, coughing, and wheezing. Do not use more than 12 puffs in 24 hours. May use Airsupra rescue inhaler 2 puffs 5 to 15 minutes prior to strenuous physical activities. Monitor frequency of use. Rinse mouth after each use.  Coupon given. Sample given.  If not covered, try to get it next year.

## 2022-06-16 NOTE — Assessment & Plan Note (Signed)
Past history - Followed by GI and had EGD which showed precancerous cells. Stopped PPI due to concerns for her bone.  Interim history - doing better with Protonix.  Continue lifestyle and dietary modifications. Continue Protonix '20mg'$  as prescribed by GI.  Keep follow up with GI.

## 2022-06-16 NOTE — Patient Instructions (Addendum)
Rhinitis: Use azelastine nasal spray 1-2 sprays per nostril twice a day as needed for runny nose/drainage. Nasal saline spray (i.e., Simply Saline) or nasal saline lavage (i.e., NeilMed) is recommended as needed and prior to medicated nasal sprays. Use a cool mist humidifier in the winter months in the bedroom.   Heartburn: Continue lifestyle and dietary modifications. Continue Protonix '20mg'$  as prescribed by GI.  Keep follow up with GI.   Breathing: Normal breathing test today.  May use Airsupra rescue inhaler 2 puffs every 4 to 6 hours as needed for shortness of breath, chest tightness, coughing, and wheezing. Do not use more than 12 puffs in 24 hours. May use Airsupra rescue inhaler 2 puffs 5 to 15 minutes prior to strenuous physical activities. Monitor frequency of use. Rinse mouth after each use.  Coupon given. Sample given.  If not covered, try to get it next year.   Eyes Use OTC rewetting eye drops as needed.   Earwax  Use over the counter debrox 5-10 drops twice a day for up to 4 days in a row to soften the earwax on the left side. The right side - we removed the earwax today.  Follow up in 6 months or sooner if needed.  Follow up with PCP regarding your heart rate.

## 2022-06-16 NOTE — Assessment & Plan Note (Signed)
Stopped using eye drops as she was concerned about the recent recalls. Use OTC rewetting eye drops as needed.  Gave list of recalled eye drops to patient.

## 2022-06-16 NOTE — Assessment & Plan Note (Signed)
Removed cerumen from right ear canal, unable to remove all cerumen from left side.  Use over the counter debrox 5-10 drops twice a day for up to 4 days in a row to soften the earwax on the left side.

## 2022-06-16 NOTE — Assessment & Plan Note (Signed)
Past history - 2022 skin testing negative to indoor/outdoor allergens. Tried Atrovent. Interim history - controlled with azelastine.  Use azelastine nasal spray 1-2 sprays per nostril twice a day as needed for runny nose/drainage. Nasal saline spray (i.e., Simply Saline) or nasal saline lavage (i.e., NeilMed) is recommended as needed and prior to medicated nasal sprays. Use a cool mist humidifier in the winter months in the bedroom.

## 2022-06-23 DIAGNOSIS — M533 Sacrococcygeal disorders, not elsewhere classified: Secondary | ICD-10-CM | POA: Diagnosis not present

## 2022-07-22 DIAGNOSIS — N952 Postmenopausal atrophic vaginitis: Secondary | ICD-10-CM | POA: Diagnosis not present

## 2022-07-22 DIAGNOSIS — K6289 Other specified diseases of anus and rectum: Secondary | ICD-10-CM | POA: Diagnosis not present

## 2022-07-22 DIAGNOSIS — R81 Glycosuria: Secondary | ICD-10-CM | POA: Diagnosis not present

## 2022-07-22 DIAGNOSIS — R82998 Other abnormal findings in urine: Secondary | ICD-10-CM | POA: Diagnosis not present

## 2022-07-22 DIAGNOSIS — K5901 Slow transit constipation: Secondary | ICD-10-CM | POA: Diagnosis not present

## 2022-07-22 DIAGNOSIS — R102 Pelvic and perineal pain: Secondary | ICD-10-CM | POA: Diagnosis not present

## 2022-07-22 DIAGNOSIS — N3941 Urge incontinence: Secondary | ICD-10-CM | POA: Diagnosis not present

## 2022-08-11 DIAGNOSIS — R52 Pain, unspecified: Secondary | ICD-10-CM | POA: Diagnosis not present

## 2022-08-11 DIAGNOSIS — R051 Acute cough: Secondary | ICD-10-CM | POA: Diagnosis not present

## 2022-08-11 DIAGNOSIS — Z03818 Encounter for observation for suspected exposure to other biological agents ruled out: Secondary | ICD-10-CM | POA: Diagnosis not present

## 2022-08-11 DIAGNOSIS — J018 Other acute sinusitis: Secondary | ICD-10-CM | POA: Diagnosis not present

## 2022-08-11 DIAGNOSIS — R509 Fever, unspecified: Secondary | ICD-10-CM | POA: Diagnosis not present

## 2022-08-17 DIAGNOSIS — E1142 Type 2 diabetes mellitus with diabetic polyneuropathy: Secondary | ICD-10-CM | POA: Diagnosis not present

## 2022-08-17 DIAGNOSIS — I10 Essential (primary) hypertension: Secondary | ICD-10-CM | POA: Diagnosis not present

## 2022-08-17 DIAGNOSIS — K219 Gastro-esophageal reflux disease without esophagitis: Secondary | ICD-10-CM | POA: Diagnosis not present

## 2022-08-17 DIAGNOSIS — E119 Type 2 diabetes mellitus without complications: Secondary | ICD-10-CM | POA: Diagnosis not present

## 2022-08-17 DIAGNOSIS — E7800 Pure hypercholesterolemia, unspecified: Secondary | ICD-10-CM | POA: Diagnosis not present

## 2022-08-17 DIAGNOSIS — H52223 Regular astigmatism, bilateral: Secondary | ICD-10-CM | POA: Diagnosis not present

## 2022-08-26 DIAGNOSIS — M7061 Trochanteric bursitis, right hip: Secondary | ICD-10-CM | POA: Diagnosis not present

## 2022-08-26 DIAGNOSIS — Z96651 Presence of right artificial knee joint: Secondary | ICD-10-CM | POA: Diagnosis not present

## 2022-08-27 DIAGNOSIS — E1142 Type 2 diabetes mellitus with diabetic polyneuropathy: Secondary | ICD-10-CM | POA: Diagnosis not present

## 2022-08-27 DIAGNOSIS — M461 Sacroiliitis, not elsewhere classified: Secondary | ICD-10-CM | POA: Diagnosis not present

## 2022-08-27 DIAGNOSIS — E1165 Type 2 diabetes mellitus with hyperglycemia: Secondary | ICD-10-CM | POA: Diagnosis not present

## 2022-08-27 DIAGNOSIS — E7800 Pure hypercholesterolemia, unspecified: Secondary | ICD-10-CM | POA: Diagnosis not present

## 2022-08-27 DIAGNOSIS — R6889 Other general symptoms and signs: Secondary | ICD-10-CM | POA: Diagnosis not present

## 2022-08-27 DIAGNOSIS — I10 Essential (primary) hypertension: Secondary | ICD-10-CM | POA: Diagnosis not present

## 2022-08-27 DIAGNOSIS — I700 Atherosclerosis of aorta: Secondary | ICD-10-CM | POA: Diagnosis not present

## 2022-08-27 DIAGNOSIS — K9089 Other intestinal malabsorption: Secondary | ICD-10-CM | POA: Diagnosis not present

## 2022-09-15 DIAGNOSIS — M545 Low back pain, unspecified: Secondary | ICD-10-CM | POA: Diagnosis not present

## 2022-09-20 DIAGNOSIS — E1142 Type 2 diabetes mellitus with diabetic polyneuropathy: Secondary | ICD-10-CM | POA: Diagnosis not present

## 2022-09-20 DIAGNOSIS — I10 Essential (primary) hypertension: Secondary | ICD-10-CM | POA: Diagnosis not present

## 2022-09-20 DIAGNOSIS — E7800 Pure hypercholesterolemia, unspecified: Secondary | ICD-10-CM | POA: Diagnosis not present

## 2022-09-20 DIAGNOSIS — E1140 Type 2 diabetes mellitus with diabetic neuropathy, unspecified: Secondary | ICD-10-CM | POA: Diagnosis not present

## 2022-09-20 DIAGNOSIS — K219 Gastro-esophageal reflux disease without esophagitis: Secondary | ICD-10-CM | POA: Diagnosis not present

## 2022-09-21 DIAGNOSIS — L718 Other rosacea: Secondary | ICD-10-CM | POA: Diagnosis not present

## 2022-09-21 DIAGNOSIS — L989 Disorder of the skin and subcutaneous tissue, unspecified: Secondary | ICD-10-CM | POA: Diagnosis not present

## 2022-09-21 DIAGNOSIS — D492 Neoplasm of unspecified behavior of bone, soft tissue, and skin: Secondary | ICD-10-CM | POA: Diagnosis not present

## 2022-09-21 DIAGNOSIS — L821 Other seborrheic keratosis: Secondary | ICD-10-CM | POA: Diagnosis not present

## 2022-09-22 DIAGNOSIS — M5451 Vertebrogenic low back pain: Secondary | ICD-10-CM | POA: Diagnosis not present

## 2022-09-29 DIAGNOSIS — M545 Low back pain, unspecified: Secondary | ICD-10-CM | POA: Diagnosis not present

## 2022-10-14 DIAGNOSIS — C44619 Basal cell carcinoma of skin of left upper limb, including shoulder: Secondary | ICD-10-CM | POA: Diagnosis not present

## 2022-10-25 DIAGNOSIS — R109 Unspecified abdominal pain: Secondary | ICD-10-CM | POA: Diagnosis not present

## 2022-10-25 DIAGNOSIS — K31A0 Gastric intestinal metaplasia, unspecified: Secondary | ICD-10-CM | POA: Diagnosis not present

## 2022-10-25 DIAGNOSIS — Z86010 Personal history of colonic polyps: Secondary | ICD-10-CM | POA: Diagnosis not present

## 2022-11-15 DIAGNOSIS — Z1231 Encounter for screening mammogram for malignant neoplasm of breast: Secondary | ICD-10-CM | POA: Diagnosis not present

## 2022-11-23 NOTE — Progress Notes (Unsigned)
Cardiology Office Note    Date:  11/25/2022   ID:  Nicolas, Eisenhauer 12-27-1945, MRN [id number]  PCP:  Thana Ates, MD  Cardiologist:  Lance Muss, MD  Electrophysiologist:  None   Chief Complaint: palpitations  History of Present Illness:   Kathleen Oliver is a 77 y.o. female with history of bronchial asthma, stroke, PVCs, mild MR by echo 2014, aortic atherosclerosis/coronary atherosclerosis, pulmonary nodules, GERD, hiatal hernia, HTN, HLD followed by PCP (on PCSK9i), obesity, DM who is seen for follow-up. She was previously seen for shortness of breath and palpitations the latter felt due to PVCs. Remote cath 2011 showed no significant CAD. Last echo 2014 showed EF 60-65%, G1DD, mild MR, trivial AI. Cor CT 08/2019 showed CAC 0, no evidence of CAD, + aortic atherosclerosis, 2mm pulmonary nodules nonspecific but statistically likely benign. SOB was ultimately felt multifactorial due to deconditioning. Her PCP followed her lung nodules with last CT 09/2021 showing no new nodules. This did show coronary calcification.  She returns for follow-up today. She reports over the last few months she has noticed an uptick in palpitations at night, described as a skip then a pause sensation. She thinks this is similar to when she was first found to have PVCs. No CP, SOB, near-syncope or syncope. She thinks it started when her neighbors began smoking pot at night and it flows into her condo. She's tried air purifiers and calling the police to no avail.   Labwork independently reviewed: 2021 K 4.3, Cr 0.74 08/2022 Mg 1.9 2019 Hgb 10.2, A1c 6.7  Past History   Past Medical History:  Diagnosis Date   Bronchial asthma    Chronic bronchitis (HCC)    "numerous years; not q yr"    Complication of anesthesia 1999   "had 3 seizures & ended up in ICU days after I'd had anesthesia"    Diverticulosis of colon    Dysrhythmia    PVC's   GERD (gastroesophageal reflux disease)    History of hiatal  hernia    HTN (hypertension)    Hypercholesteremia    Hypertension    Obesity    Osteoarthrosis    Pneumonia 2000's X 1   Recurrent UTI    Seizures (HCC) 1999 X 3   "days after anesthesia"- no further issues   Stroke (HCC)    /MRI from 1999;; showed I'd had had several small ones"-no residual effects.   Type II diabetes mellitus (HCC)     Past Surgical History:  Procedure Laterality Date   ABDOMINAL HERNIA REPAIR  2001 X 2   APPENDECTOMY  ~ 2000   BIOPSY THYROID     BLADDER SUSPENSION  2000's   CARDIAC CATHETERIZATION  1990's? ; 11/2009   CARPAL TUNNEL RELEASE Bilateral ~ 2003   CATARACT EXTRACTION W/ INTRAOCULAR LENS  IMPLANT, BILATERAL Bilateral ~ 2012   CHOLECYSTECTOMY OPEN  1984   COLECTOMY  ~ 2000   "perforated colon due to diverticulitis"   COLONOSCOPY  04/22/2021   COLONOSCOPY WITH PROPOFOL N/A 02/18/2015   Procedure: COLONOSCOPY WITH PROPOFOL;  Surgeon: Charolett Bumpers, MD;  Location: [facility];  Service: Endoscopy;  Laterality: N/A;   COLOSTOMY  ~ 2000   COLOSTOMY REVERSAL  ~ 2000   "only needed it 9 months"   ESOPHAGEAL DILATION  2000's X 1   HERNIA REPAIR     SHOULDER ARTHROSCOPY W/ ROTATOR CUFF REPAIR Right    x2 -scope '07   TONSILLECTOMY AND ADENOIDECTOMY  1950's  TOTAL ABDOMINAL HYSTERECTOMY  2004   TOTAL KNEE ARTHROPLASTY Right 02/24/2018   TOTAL KNEE ARTHROPLASTY Right 02/24/2018   Procedure: RIGHT TOTAL KNEE ARTHROPLASTY;  Surgeon: Beverely Low, MD;  Location: [facility] OR;  Service: Orthopedics;  Laterality: Right;   TRIGGER FINGER RELEASE Right 10/2009   "ring finger"   TRIGGER FINGER RELEASE Right 04/2014   "index & pinky"   UPPER GI ENDOSCOPY  04/22/2021    Current Medications: Current Meds  Medication Sig   ACCU-CHEK AVIVA PLUS test strip    albuterol (VENTOLIN HFA) 108 (90 Base) MCG/ACT inhaler Inhale 2 puffs into the lungs every 4 (four) hours as needed for wheezing or shortness of breath (coughing fits).   Albuterol-Budesonide (AIRSUPRA)  90-80 MCG/ACT AERO Inhale 2 puffs into the lungs every 4 (four) hours as needed (coughing, wheezing, chest tightness). Do not exceed 12 puffs in 24 hours.   amLODipine (NORVASC) 5 MG tablet SMARTSIG:1 Tablet(s) By Mouth Every Evening   ANUCORT-HC 25 MG suppository Place 25 mg rectally daily as needed.   azelastine (ASTELIN) 0.1 % nasal spray Place 1-2 sprays into both nostrils 2 (two) times daily as needed (nasal drainage). Use in each nostril as directed   Cholecalciferol (VITAMIN D) 2000 UNITS CAPS Take 2,000 Units by mouth daily.   clopidogrel (PLAVIX) 75 MG tablet Take 75 mg by mouth at bedtime.    Evolocumab with Infusor 420 MG/3.5ML SOCT Inject 120-130 mg into the skin every 14 (fourteen) days.   gabapentin (NEURONTIN) 100 MG capsule Take 100 mg by mouth at bedtime.   hydrochlorothiazide (HYDRODIURIL) 12.5 MG tablet Take 12.5 mg by mouth every morning.   ketoconazole (NIZORAL) 2 % cream Apply topically as needed for irritation.   latanoprost (XALATAN) 0.005 % ophthalmic solution Place 1 drop into both eyes at bedtime.    lidocaine (LMX) 4 % cream Apply 1 application topically as needed (pain).   Lidocaine 4 % PTCH Apply 1 patch topically daily as needed (pain).   loperamide (IMODIUM) 1 MG/5ML solution Take 1 mg by mouth as needed for diarrhea or loose stools.   losartan (COZAAR) 50 MG tablet Take 1 tablet (50 mg total) by mouth daily. And check Blood Pressure   metFORMIN (GLUCOPHAGE) 500 MG tablet Take 500 mg by mouth daily.   methocarbamol (ROBAXIN) 500 MG tablet Take 1 tablet (500 mg total) by mouth 3 (three) times daily as needed.   pantoprazole (PROTONIX) 20 MG tablet Take 20 mg by mouth daily.   Polyethyl Glycol-Propyl Glycol (SYSTANE) 0.4-0.3 % GEL ophthalmic gel Place 1 application into both eyes at bedtime. (Patient taking differently: Place 1 application  into both eyes as needed (dry eyes).)   Simethicone (GAS-X PO) Take 1 capsule by mouth daily as needed (abdominal pain).    sodium chloride (OCEAN) 0.65 % SOLN nasal spray Place 1 spray into both nostrils daily as needed for congestion.    traMADol (ULTRAM) 50 MG tablet Take 50 mg by mouth every 6 (six) hours as needed (Back pain.).    [DISCONTINUED] Propylene Glycol (SYSTANE COMPLETE) 0.6 % SOLN Apply 1 drop to eye 4 (four) times daily as needed.      Allergies:   Tizanidine, Atorvastatin, Colesevelam hcl, Other, Cinoxacin, Codeine, and Penicillins   Social History   Socioeconomic History   Marital status: Divorced    Spouse name: Not on file   Number of children: Not on file   Years of education: Not on file   Highest education level: Not on file  Occupational History   Not on file  Tobacco Use   Smoking status: Former    Packs/day: 2.00    Years: 20.00    Additional pack years: 0.00    Total pack years: 40.00    Types: Cigarettes    Quit date: 02/15/1982    Years since quitting: 40.8   Smokeless tobacco: Never  Substance and Sexual Activity   Alcohol use: Yes    Comment: "stopped drinking in1984"   Drug use: No   Sexual activity: Not Currently  Other Topics Concern   Not on file  Social History Narrative   Not on file   Social Determinants of Health   Financial Resource Strain: Not on file  Food Insecurity: Not on file  Transportation Needs: Not on file  Physical Activity: Not on file  Stress: Not on file  Social Connections: Not on file     Family History:  The patient's family history includes Arthritis in her daughter; Diabetes in her brother; Diverticulitis in her mother; Heart attack in her brother and father; Hypertension in her mother and sister.  ROS:   Please see the history of present illness.  All other systems are reviewed and otherwise negative.    EKG(s)/Additional Testing   EKG:  EKG is ordered today, personally reviewed, demonstrating SB 59bpm, no acute STT changes.  CV Studies: Cardiac studies reviewed are outlined and summarized above. Otherwise please see  EMR for full report.  Recent Labs: No results found for requested labs within last 365 days.  Recent Lipid Panel No results found for: "CHOL", "TRIG", "HDL", "CHOLHDL", "VLDL", "LDLCALC", "LDLDIRECT"  PHYSICAL EXAM:    VS:  BP 130/70   Pulse (!) 59   Ht 5\' 1"  (1.549 m)   Wt 131 lb (59.4 kg)   BMI 24.75 kg/m   BMI: Body mass index is 24.75 kg/m.  GEN: Well nourished, well developed female in no acute distress HEENT: normocephalic, atraumatic Neck: no JVD, carotid bruits, or masses Cardiac: RRR; no murmurs, rubs, or gallops, no edema  Respiratory:  clear to auscultation bilaterally, normal work of breathing GI: soft, nontender, nondistended, + BS MS: no deformity or atrophy Skin: warm and dry, no rash Neuro:  Alert and Oriented x 3, Strength and sensation are intact, follows commands Psych: euthymic mood, full affect  Wt Readings from Last 3 Encounters:  11/25/22 131 lb (59.4 kg)  06/16/22 136 lb 14.4 oz (62.1 kg)  10/21/21 135 lb (61.2 kg)     ASSESSMENT & PLAN:   1. Palpitations, h/o PVCs - obtain 3 day Zio to evaluate burden of ectopy and HR trends given patient concern. If low burden would favor conservative approach given baseline bradycardia (patient is hopeful not to have to start medicine but is concerned about frequency). If she were to require therapy may need EP input for AAD but hopeful we won't be at that point yet. Update basic labs. Get echocardiogram.  2. Coronary and aortic atherosclerosis by CT, HLD - no accelerating anginal symptoms. She is on Plavix presumably for prior stroke, not presently on ASA. Lipids are managed by PCP with PCSK9i.  3. Essential HTN - BP ULN, appropriate for age/clinical scenario, follow up at next visit.  4. Mild MR - patient would like to obtain f/u echo as above.    Disposition: F/u with me in 2 months.   Medication Adjustments/Labs and Tests Ordered: Current medicines are reviewed at length with the patient today.  Concerns  regarding medicines are outlined  above. Medication changes, Labs and Tests ordered today are summarized above and listed in the Patient Instructions accessible in Encounters.   Signed, Laurann Montana, PA-C  11/25/2022 5:22 PM    [facility] Phone: [phone]; Fax: [phone]

## 2022-11-25 ENCOUNTER — Encounter: Payer: Self-pay | Admitting: Physician Assistant

## 2022-11-25 ENCOUNTER — Ambulatory Visit: Payer: Medicare HMO | Attending: Physician Assistant | Admitting: Physician Assistant

## 2022-11-25 ENCOUNTER — Ambulatory Visit (INDEPENDENT_AMBULATORY_CARE_PROVIDER_SITE_OTHER): Payer: Medicare HMO

## 2022-11-25 VITALS — BP 130/70 | HR 59 | Ht 61.0 in | Wt 131.0 lb

## 2022-11-25 DIAGNOSIS — I10 Essential (primary) hypertension: Secondary | ICD-10-CM

## 2022-11-25 DIAGNOSIS — I493 Ventricular premature depolarization: Secondary | ICD-10-CM

## 2022-11-25 DIAGNOSIS — E785 Hyperlipidemia, unspecified: Secondary | ICD-10-CM

## 2022-11-25 DIAGNOSIS — R002 Palpitations: Secondary | ICD-10-CM | POA: Diagnosis not present

## 2022-11-25 DIAGNOSIS — I340 Nonrheumatic mitral (valve) insufficiency: Secondary | ICD-10-CM

## 2022-11-25 DIAGNOSIS — I2584 Coronary atherosclerosis due to calcified coronary lesion: Secondary | ICD-10-CM | POA: Diagnosis not present

## 2022-11-25 DIAGNOSIS — I2510 Atherosclerotic heart disease of native coronary artery without angina pectoris: Secondary | ICD-10-CM

## 2022-11-25 NOTE — Progress Notes (Unsigned)
Applied a 3 day Zio XT monitor to patient in the office  [place] to read

## 2022-11-25 NOTE — Patient Instructions (Signed)
Medication Instructions:  Your physician recommends that you continue on your current medications as directed. Please refer to the Current Medication list given to you today.  *If you need a refill on your cardiac medications before your next appointment, please call your pharmacy*  Lab Work: TODAY: BMET, CBC, Magnesium, TSH If you have labs (blood work) drawn today and your tests are completely normal, you will receive your results only by: MyChart Message (if you have MyChart) OR A paper copy in the mail If you have any lab test that is abnormal or we need to change your treatment, we will call you to review the results.  Testing/Procedures: Your physician has requested that you have an echocardiogram. Echocardiography is a painless test that uses sound waves to create images of your heart. It provides your doctor with information about the size and shape of your heart and how well your heart's chambers and valves are working. This procedure takes approximately one hour. There are no restrictions for this procedure. Please do NOT wear cologne, perfume, aftershave, or lotions (deodorant is allowed). Please arrive 15 minutes prior to your appointment time.  Your physician has requested that you wear a Zio heart monitor for 3 days. This will be mailed to your home with instructions on how to apply the monitor and how to return it when finished. Please allow 2 weeks after returning the heart monitor before our office calls you with the results.   Follow-Up: At [facility], you and your health needs are our priority.  As part of our continuing mission to provide you with exceptional heart care, we have created designated Provider Care Teams.  These Care Teams include your primary Cardiologist (physician) and Advanced Practice Providers (APPs -  Physician Assistants and Nurse Practitioners) who all work together to provide you with the care you need, when you need it.  Your next appointment:    2 month(s)  The format for your next appointment:   In Person  Provider:   Ronie Spies, PA-C       Other Instructions ZIO XT- Long Term Monitor Instructions     Your physician has requested you wear a ZIO patch monitor for 3 days.  This is a single patch monitor. [employer] supplies one patch monitor per enrollment. Additional  stickers are not available. Please do not apply patch if you will be having a Nuclear Stress Test,  Echocardiogram, Cardiac CT, MRI, or Chest Xray during the period you would be wearing the  monitor. The patch cannot be worn during these tests. You cannot remove and re-apply the  ZIO XT patch monitor.  Your ZIO patch monitor will be mailed 3 day USPS to your address on file. It may take 3-5 days  to receive your monitor after you have been enrolled.  Once you have received your monitor, please review the enclosed instructions. Your monitor  has already been registered assigning a specific monitor serial # to you.     Billing and Patient Assistance Program Information     We have supplied [employer] with any of your insurance information on file for billing purposes.  [employer] offers a sliding scale Patient Assistance Program for patients that do not have  insurance, or whose insurance does not completely cover the cost of the ZIO monitor.  You must apply for the Patient Assistance Program to qualify for this discounted rate.  To apply, please call [employer] at [phone], select option 4, select option 2, ask to apply for  Patient Assistance Program. Meredeth Ide will ask your household income, and how many people  are in your household. They will quote your out-of-pocket cost based on that information.  [employer] will also be able to set up a 56-month, interest-free payment plan if needed.     Applying the monitor     Shave hair from upper left chest.  Hold abrader disc by orange tab. Rub abrader in 40 strokes over the upper left chest as  indicated in your monitor  instructions.  Clean area with 4 enclosed alcohol pads. Let dry.  Apply patch as indicated in monitor instructions. Patch will be placed under collarbone on left  side of chest with arrow pointing upward.  Rub patch adhesive wings for 2 minutes. Remove white label marked "1". Remove the white  label marked "2". Rub patch adhesive wings for 2 additional minutes.  While looking in a mirror, press and release button in center of patch. A small green light will  flash 3-4 times. This will be your only indicator that the monitor has been turned on.  Do not shower for the first 24 hours. You may shower after the first 24 hours.  Press the button if you feel a symptom. You will hear a small click. Record Date, Time and  Symptom in the Patient Logbook.  When you are ready to remove the patch, follow instructions on the last 2 pages of Patient  Logbook. Stick patch monitor onto the last page of Patient Logbook.  Place Patient Logbook in the blue and white box. Use locking tab on box and tape box closed  securely. The blue and white box has prepaid postage on it. Please place it in the mailbox as  soon as possible. Your physician should have your test results approximately 7 days after the  monitor has been mailed back to [facility].  Call [facility] Customer Care at [phone] if you have questions regarding  your ZIO XT patch monitor. Call them immediately if you see an orange light blinking on your  monitor.  If your monitor falls off in less than 4 days, contact our Monitor department at [phone].  If your monitor becomes loose or falls off after 4 days call [employer] at [phone] for  suggestions on securing your monitor.

## 2022-11-26 ENCOUNTER — Telehealth: Payer: Self-pay | Admitting: Interventional Cardiology

## 2022-11-26 DIAGNOSIS — I493 Ventricular premature depolarization: Secondary | ICD-10-CM

## 2022-11-26 DIAGNOSIS — Z79899 Other long term (current) drug therapy: Secondary | ICD-10-CM

## 2022-11-26 DIAGNOSIS — K649 Unspecified hemorrhoids: Secondary | ICD-10-CM | POA: Diagnosis not present

## 2022-11-26 DIAGNOSIS — E1142 Type 2 diabetes mellitus with diabetic polyneuropathy: Secondary | ICD-10-CM | POA: Diagnosis not present

## 2022-11-26 DIAGNOSIS — E1165 Type 2 diabetes mellitus with hyperglycemia: Secondary | ICD-10-CM | POA: Diagnosis not present

## 2022-11-26 LAB — BASIC METABOLIC PANEL
BUN/Creatinine Ratio: 22 (ref 12–28)
BUN: 22 mg/dL (ref 8–27)
CO2: 21 mmol/L (ref 20–29)
Calcium: 9.3 mg/dL (ref 8.7–10.3)
Chloride: 102 mmol/L (ref 96–106)
Creatinine, Ser: 0.98 mg/dL (ref 0.57–1.00)
Glucose: 179 mg/dL — ABNORMAL HIGH (ref 70–99)
Potassium: 4.1 mmol/L (ref 3.5–5.2)
Sodium: 142 mmol/L (ref 134–144)
eGFR: 60 mL/min/{1.73_m2} (ref >59)

## 2022-11-26 LAB — CBC
Hematocrit: 38.3 % (ref 34.0–46.6)
Hemoglobin: 12.4 g/dL (ref 11.1–15.9)
MCH: 30.7 pg (ref 26.6–33.0)
MCHC: 32.4 g/dL (ref 31.5–35.7)
MCV: 95 fL (ref 79–97)
Platelets: 254 10*3/uL (ref 150–450)
RBC: 4.04 x10E6/uL (ref 3.77–5.28)
RDW: 12.5 % (ref 11.7–15.4)
WBC: 5.7 10*3/uL (ref 3.4–10.8)

## 2022-11-26 LAB — MAGNESIUM: Magnesium: 1.8 mg/dL (ref 1.6–2.3)

## 2022-11-26 LAB — TSH: TSH: 1.25 u[IU]/mL (ref 0.450–4.500)

## 2022-11-26 MED ORDER — MAG GLYCINATE 100 MG PO TABS: 200.0000 mg | ORAL_TABLET | Freq: Every day | ORAL | 0 refills | Status: DC

## 2022-11-26 NOTE — Telephone Encounter (Signed)
Left message to call office

## 2022-11-26 NOTE — Telephone Encounter (Signed)
Patient is returning call in regards to results. Requesting return call.  

## 2022-11-26 NOTE — Telephone Encounter (Signed)
-----   Message from Laurann Montana, [place] sent at 11/26/2022  9:17 AM EDT ----- Please let patient know her labs are stable. Her magnesium level level is normal, but at the low end for her history of PVCs. Goal 2.0. Recommend either sending in prescription for Mag Ox 400 mg daily or getting Mag Glycinate 200mg  daily (available OTC - better absorbed) with recheck Mg level about 10 days after starting supplement. Make sure to have taken supplement a few hours before lab draw that day, non fasting. Otherwise continue plan as discussed.

## 2022-11-26 NOTE — Telephone Encounter (Signed)
Patient notified.  She would like to take OTC Mag Glycinate 200mg  daily. Patient will pick this up on Monday and come in for lab work on June 13.  Will send comments to patient through my chart.

## 2022-12-06 DIAGNOSIS — I493 Ventricular premature depolarization: Secondary | ICD-10-CM | POA: Diagnosis not present

## 2022-12-09 ENCOUNTER — Ambulatory Visit: Payer: Medicare HMO | Attending: Physician Assistant

## 2022-12-09 DIAGNOSIS — I493 Ventricular premature depolarization: Secondary | ICD-10-CM

## 2022-12-09 DIAGNOSIS — Z79899 Other long term (current) drug therapy: Secondary | ICD-10-CM | POA: Diagnosis not present

## 2022-12-10 ENCOUNTER — Other Ambulatory Visit: Payer: Self-pay | Admitting: *Deleted

## 2022-12-10 DIAGNOSIS — R790 Abnormal level of blood mineral: Secondary | ICD-10-CM

## 2022-12-10 LAB — MAGNESIUM: Magnesium: 1.7 mg/dL (ref 1.6–2.3)

## 2022-12-14 NOTE — Progress Notes (Unsigned)
Follow Up Note  RE: Kathleen Oliver MRN: [id number] DOB: 1945-11-28 Date of Office Visit: 12/15/2022  Referring provider: Merlene Laughter, MD Primary care provider: Thana Ates, MD  Chief Complaint: No chief complaint on file.  History of Present Illness: I had the pleasure of seeing Kathleen Oliver for a [facility]  on 12/14/2022. She is a 77 y.o. female, who is being followed for nonallergic rhinitis, dry eyes, GERD, reactive airway disease. Her previous allergy office visit was on 06/16/2022 with Dr. Selena Batten. Today is a regular follow up visit.  Nonallergic rhinitis Past history - 2022 skin testing negative to indoor/outdoor allergens. Tried Atrovent. Interim history - controlled with azelastine.  Use azelastine nasal spray 1-2 sprays per nostril twice a day as needed for runny nose/drainage. Nasal saline spray (i.e., Simply Saline) or nasal saline lavage (i.e., NeilMed) is recommended as needed and prior to medicated nasal sprays. Use a cool mist humidifier in the winter months in the bedroom.    Dry eyes Stopped using eye drops as she was concerned about the recent recalls. Use OTC rewetting eye drops as needed.  Gave list of recalled eye drops to patient.    GERD (gastroesophageal reflux disease) Past history - Followed by GI and had EGD which showed precancerous cells. Stopped PPI due to concerns for her bone.  Interim history - doing better with Protonix.  Continue lifestyle and dietary modifications. Continue Protonix 20mg  as prescribed by GI.  Keep follow up with GI.   Reactive airway disease without complication Past history - Uses albuterol only during URIs. Stopped Singulair for awhile with no worsening symptoms. Interim history - asymptomatic.  Today's spirometry was normal.  May use Airsupra rescue inhaler 2 puffs every 4 to 6 hours as needed for shortness of breath, chest tightness, coughing, and wheezing. Do not use more than  12 puffs in 24 hours. May use Airsupra rescue inhaler 2 puffs 5 to 15 minutes prior to strenuous physical activities. Monitor frequency of use. Rinse mouth after each use.  Coupon given. Sample given.  If not covered, try to get it next year.    Bilateral impacted cerumen Removed cerumen from right ear canal, unable to remove all cerumen from left side.  Use over the counter debrox 5-10 drops twice a day for up to 4 days in a row to soften the earwax on the left side.   Decreased heart rate Mentions HR going to the 50s at times at home and feeling lousy. HR in office was 60 today. Follow up with PCP regarding the heart rate.    Return in about 6 months (around 12/16/2022).  Assessment and Plan: Kathleen Oliver is a 76 y.o. female with: No problem-specific Assessment & Plan notes found for this encounter.  No follow-ups on file.  No orders of the defined types were placed in this encounter.  Lab Orders  No laboratory test(s) ordered today    Diagnostics: Spirometry:  Tracings reviewed. Her effort: {Blank single:19197::"Good reproducible efforts.","It was hard to get consistent efforts and there is a question as to whether this reflects a maximal maneuver.","Poor effort, data can not be interpreted."} FVC: ***L FEV1: ***L, ***% predicted FEV1/FVC ratio: ***% Interpretation: {Blank single:19197::"Spirometry consistent with mild obstructive disease","Spirometry consistent with moderate obstructive disease","Spirometry consistent with severe obstructive disease","Spirometry consistent with possible restrictive disease","Spirometry consistent with mixed obstructive and restrictive disease","Spirometry uninterpretable due to technique","Spirometry consistent with normal pattern","No overt abnormalities noted given today's efforts"}.  Please see scanned spirometry results for details.  Skin Testing: {Blank single:19197::"Select foods","Environmental allergy panel","Environmental allergy panel and  select foods","Food allergy panel","None","Deferred due to recent antihistamines use"}. *** Results discussed with patient/family.   Medication List:  Current Outpatient Medications  Medication Sig Dispense Refill   ACCU-CHEK AVIVA PLUS test strip      albuterol (VENTOLIN HFA) 108 (90 Base) MCG/ACT inhaler Inhale 2 puffs into the lungs every 4 (four) hours as needed for wheezing or shortness of breath (coughing fits). 18 g 1   Albuterol-Budesonide (AIRSUPRA) 90-80 MCG/ACT AERO Inhale 2 puffs into the lungs every 4 (four) hours as needed (coughing, wheezing, chest tightness). Do not exceed 12 puffs in 24 hours. 10.7 g 2   amLODipine (NORVASC) 5 MG tablet SMARTSIG:1 Tablet(s) By Mouth Every Evening     ANUCORT-HC 25 MG suppository Place 25 mg rectally daily as needed.     azelastine (ASTELIN) 0.1 % nasal spray Place 1-2 sprays into both nostrils 2 (two) times daily as needed (nasal drainage). Use in each nostril as directed 30 mL 5   Cholecalciferol (VITAMIN D) 2000 UNITS CAPS Take 2,000 Units by mouth daily.     clopidogrel (PLAVIX) 75 MG tablet Take 75 mg by mouth at bedtime.      Evolocumab with Infusor 420 MG/3.5ML SOCT Inject 120-130 mg into the skin every 14 (fourteen) days.     gabapentin (NEURONTIN) 100 MG capsule Take 100 mg by mouth at bedtime.     hydrochlorothiazide (HYDRODIURIL) 12.5 MG tablet Take 12.5 mg by mouth every morning.     ketoconazole (NIZORAL) 2 % cream Apply topically as needed for irritation.     latanoprost (XALATAN) 0.005 % ophthalmic solution Place 1 drop into both eyes at bedtime.      lidocaine (LMX) 4 % cream Apply 1 application topically as needed (pain).     Lidocaine 4 % PTCH Apply 1 patch topically daily as needed (pain).     loperamide (IMODIUM) 1 MG/5ML solution Take 1 mg by mouth as needed for diarrhea or loose stools.     losartan (COZAAR) 50 MG tablet Take 1 tablet (50 mg total) by mouth daily. And check Blood Pressure 30 tablet 5   Magnesium  Bisglycinate (MAG GLYCINATE) 100 MG TABS Take 200 mg by mouth daily. 60 tablet 0   metFORMIN (GLUCOPHAGE) 500 MG tablet Take 500 mg by mouth daily.     methocarbamol (ROBAXIN) 500 MG tablet Take 1 tablet (500 mg total) by mouth 3 (three) times daily as needed. 40 tablet 1   pantoprazole (PROTONIX) 20 MG tablet Take 20 mg by mouth daily.     Polyethyl Glycol-Propyl Glycol (SYSTANE) 0.4-0.3 % GEL ophthalmic gel Place 1 application into both eyes at bedtime. (Patient taking differently: Place 1 application  into both eyes as needed (dry eyes).) 10 mL 5   Simethicone (GAS-X PO) Take 1 capsule by mouth daily as needed (abdominal pain).     sodium chloride (OCEAN) 0.65 % SOLN nasal spray Place 1 spray into both nostrils daily as needed for congestion.      traMADol (ULTRAM) 50 MG tablet Take 50 mg by mouth every 6 (six) hours as needed (Back pain.).      No current facility-administered medications for this visit.   Allergies: Allergies  Allergen Reactions   Tizanidine Other (See Comments)    Heart flutter   Atorvastatin Other (See Comments)   Colesevelam Hcl Other (See Comments)   Other Other (See Comments)  Cinoxacin Other (See Comments)    hives   Codeine Itching   Penicillins Hives    Has patient had a PCN reaction causing immediate rash, facial/tongue/throat swelling, SOB or lightheadedness with hypotension: Yes Has patient had a PCN reaction causing severe rash involving mucus membranes or skin necrosis: Yes Has patient had a PCN reaction that required hospitalization No Has patient had a PCN reaction occurring within the last 10 years: No If all of the above answers are "NO", then may proceed with Cephalosporin use.     I reviewed her past medical history, social history, family history, and environmental history and no significant changes have been reported from her previous visit.  Review of Systems  Constitutional:  Negative for appetite change, chills, fever and unexpected  weight change.  HENT:  Positive for postnasal drip and rhinorrhea. Negative for congestion.        Ear itching  Eyes:  Negative for itching.       Dry eyes  Respiratory:  Negative for cough, chest tightness, shortness of breath and wheezing.   Gastrointestinal:  Negative for abdominal pain.  Skin:  Negative for rash.  Allergic/Immunologic: Negative for environmental allergies.  Neurological:  Negative for headaches.    Objective: There were no vitals taken for this visit. There is no height or weight on file to calculate BMI. Physical Exam Vitals and nursing note reviewed.  Constitutional:      Appearance: Normal appearance. She is well-developed.  HENT:     Head: Normocephalic and atraumatic.     Right Ear: External ear normal. There is impacted cerumen.     Left Ear: Tympanic membrane and external ear normal.     Nose: Nose normal.     Mouth/Throat:     Mouth: Mucous membranes are moist.     Pharynx: Oropharynx is clear.  Eyes:     Conjunctiva/sclera: Conjunctivae normal.  Cardiovascular:     Rate and Rhythm: Normal rate and regular rhythm.     Heart sounds: Normal heart sounds. No murmur heard. Pulmonary:     Effort: Pulmonary effort is normal.     Breath sounds: Normal breath sounds. No wheezing, rhonchi or rales.  Musculoskeletal:     Cervical back: Neck supple.  Skin:    General: Skin is warm.     Findings: No rash.  Neurological:     Mental Status: She is alert and oriented to person, place, and time.  Psychiatric:        Behavior: Behavior normal.    Previous notes and tests were reviewed. The plan was reviewed with the patient/family, and all questions/concerned were addressed.  It was my pleasure to see Janna today and participate in her care. Please feel free to contact me with any questions or concerns.  Sincerely,  Wyline Mood, DO Allergy & Immunology  [facility] office: [phone] [facility] office:  [phone]

## 2022-12-15 ENCOUNTER — Other Ambulatory Visit: Payer: Self-pay

## 2022-12-15 ENCOUNTER — Encounter: Payer: Self-pay | Admitting: Allergy

## 2022-12-15 ENCOUNTER — Ambulatory Visit: Payer: Medicare HMO | Admitting: Allergy

## 2022-12-15 VITALS — BP 132/64 | HR 60 | Temp 98.4°F | Ht 60.24 in | Wt 131.6 lb

## 2022-12-15 DIAGNOSIS — J45909 Unspecified asthma, uncomplicated: Secondary | ICD-10-CM | POA: Diagnosis not present

## 2022-12-15 DIAGNOSIS — J310 Chronic rhinitis: Secondary | ICD-10-CM | POA: Diagnosis not present

## 2022-12-15 DIAGNOSIS — K219 Gastro-esophageal reflux disease without esophagitis: Secondary | ICD-10-CM | POA: Diagnosis not present

## 2022-12-15 DIAGNOSIS — H04123 Dry eye syndrome of bilateral lacrimal glands: Secondary | ICD-10-CM

## 2022-12-15 DIAGNOSIS — H6123 Impacted cerumen, bilateral: Secondary | ICD-10-CM | POA: Diagnosis not present

## 2022-12-15 DIAGNOSIS — R001 Bradycardia, unspecified: Secondary | ICD-10-CM | POA: Diagnosis not present

## 2022-12-15 MED ORDER — LEVALBUTEROL TARTRATE 45 MCG/ACT IN AERO: 2.0000 | INHALATION_SPRAY | RESPIRATORY_TRACT | 2 refills | Status: DC | PRN

## 2022-12-15 NOTE — Patient Instructions (Addendum)
Rhinitis: Use azelastine nasal spray 1-2 sprays per nostril twice a day as needed for runny nose/drainage. Nasal saline spray (i.e., Simply Saline) or nasal saline lavage (i.e., NeilMed) is recommended as needed and prior to medicated nasal sprays.  Heartburn: Continue lifestyle and dietary modifications. Continue Protonix 20mg  as prescribed by GI.   Breathing: Normal breathing test today.  Let me know if levoalbuterol is not covered.  May use levoalbuterol rescue inhaler 2 puffs every 4 to 6 hours as needed for shortness of breath, chest tightness, coughing, and wheezing.  Monitor frequency of use - if you need to use it more than twice per week on a consistent basis let us know.   Earwax Recommend ENT for earwax removal.   Follow up in 6 months or sooner if needed.  Follow up with cardiology regarding your heart rate.

## 2022-12-15 NOTE — Assessment & Plan Note (Signed)
Past history - 2022 skin testing negative to indoor/outdoor allergens. Tried Atrovent. Interim history - controlled. Use azelastine nasal spray 1-2 sprays per nostril twice a day as needed for runny nose/drainage. Nasal saline spray (i.e., Simply Saline) or nasal saline lavage (i.e., NeilMed) is recommended as needed and prior to medicated nasal sprays.

## 2022-12-15 NOTE — Assessment & Plan Note (Signed)
Past history - Followed by GI and had EGD which showed precancerous cells. Stopped PPI due to concerns for her bone.  Continue lifestyle and dietary modifications. Continue Protonix 20mg  as prescribed by GI.

## 2022-12-15 NOTE — Assessment & Plan Note (Signed)
Debrox ineffective and made things worse. Recommend ENT for earwax removal.

## 2022-12-15 NOTE — Assessment & Plan Note (Signed)
Past history - Mentions HR going to the 50s at times at home and feeling lousy. HR in office was 60 today. Follow up with cards regarding the heart rate.

## 2022-12-15 NOTE — Assessment & Plan Note (Signed)
Past history - Uses albuterol only during URIs. Stopped Singulair for awhile with no worsening symptoms. Interim history - Airsupra too expensive and albuterol makes her shaky. Sometimes when it's humid outside she feels the need to use inhaler.  Today's spirometry was normal.  Let me know if levoalbuterol is not covered.  May use levoalbuterol rescue inhaler 2 puffs every 4 to 6 hours as needed for shortness of breath, chest tightness, coughing, and wheezing.  Monitor frequency of use - if you need to use it more than twice per week on a consistent basis let us know.

## 2022-12-20 DIAGNOSIS — K644 Residual hemorrhoidal skin tags: Secondary | ICD-10-CM | POA: Diagnosis not present

## 2022-12-24 ENCOUNTER — Ambulatory Visit: Payer: Medicare HMO | Attending: Physician Assistant

## 2022-12-24 DIAGNOSIS — R790 Abnormal level of blood mineral: Secondary | ICD-10-CM

## 2022-12-25 LAB — MAGNESIUM: Magnesium: 1.8 mg/dL (ref 1.6–2.3)

## 2022-12-28 ENCOUNTER — Ambulatory Visit (HOSPITAL_COMMUNITY): Payer: Medicare HMO | Attending: Internal Medicine

## 2022-12-28 DIAGNOSIS — E119 Type 2 diabetes mellitus without complications: Secondary | ICD-10-CM | POA: Diagnosis not present

## 2022-12-28 DIAGNOSIS — I080 Rheumatic disorders of both mitral and aortic valves: Secondary | ICD-10-CM | POA: Insufficient documentation

## 2022-12-28 DIAGNOSIS — I493 Ventricular premature depolarization: Secondary | ICD-10-CM | POA: Diagnosis not present

## 2022-12-28 DIAGNOSIS — I10 Essential (primary) hypertension: Secondary | ICD-10-CM | POA: Diagnosis not present

## 2022-12-28 DIAGNOSIS — I119 Hypertensive heart disease without heart failure: Secondary | ICD-10-CM | POA: Insufficient documentation

## 2022-12-28 LAB — ECHOCARDIOGRAM COMPLETE
Area-P 1/2: 3.03 cm2
P 1/2 time: 465 msec
S' Lateral: 2.7 cm

## 2022-12-29 ENCOUNTER — Telehealth: Payer: Self-pay | Admitting: Physician Assistant

## 2022-12-29 MED ORDER — METOPROLOL SUCCINATE ER 25 MG PO TB24: 12.5000 mg | ORAL_TABLET | Freq: Every day | ORAL | 3 refills | Status: DC

## 2022-12-29 NOTE — Telephone Encounter (Signed)
Returned patient's call regarding her echo results. Patient with questions about mitral valve annular calcifications which seem to be classified as severe in Echo report. She is also concerned about severe basal septal hypertrophy. Explained that Kathleen Oliver appears to be in the process of looking into additional imaging to further assess these findings. Patient verbalizes understanding that additional imaging may be needed to fully diagnose mitral valve condition as well as whether any underlying conditions such as heart failure are present.  Patient states that she is continuing to have palpitations every day and is wondering if there is medication that can control this. She states she has to stop and rest for a few minutes when palpitations come on before they pass. She denies any chest pain, SOB. Assured patient I would forward her concerns to D. Dunn PA-C and also reviewed ED precautions.

## 2022-12-29 NOTE — Telephone Encounter (Signed)
Patient notified.  Prescription sent to [facility] on [address]. Patient reports she has been under a great deal of stress recently and asking if this could contribute to palpitations.  I told patient stress, caffeine and decongestants could contribute to palpitations. Patient asked if medication could be prescribed to help with stress and I asked her to reach out to her PCP for this request.

## 2022-12-29 NOTE — Telephone Encounter (Signed)
Follow Up:      Patient said she saw her Echo results on My Chart. She said she would like for Dayna or her nurse to call and explain her results.F

## 2022-12-29 NOTE — Telephone Encounter (Signed)
Please let pt know I reviewed echo with Dr. Eldridge Dace: Re: the basal septal hypertrophy, Dr. Eldridge Dace reports there is no pressure gradient and therefore he feels this is likely age related. We also see this in patients who have history of high blood pressure which she has a history of.  I am not concerned about the mitral annular calcification because it is not currently impacting the function of her mitral valve, which would be the primary concern if it were a problem. Mitral annular calcification is typically asymptomatic, identified as an incidental finding, with usually little or no impact on the heart pump function. We look next to whether or not her mitral valve function is abnormal, but her echo is reassuring. There was only trivial (MINOR) leaking of mitral valve which is a very common finding and something I'm not worried about.  There is unrelated moderate leaking of the aortic valve for which we would typically recommend f/u echo in 1 year. Would go ahead and get this ordered for her. Again, not acutely concerned about these findings. Her overall heart pump function is completely normal.  If she is symptomatic with palpitations, let's trial metoprolol succinate 12.5mg  daily and patient to notify if HR drops below 50 during routine HR check during the day. I chose a lower dose to start since office HR was 59bpm, though average HR by Zio was 70 which was good.

## 2022-12-29 NOTE — Addendum Note (Signed)
Addended by: Dossie Arbour on: 12/29/2022 03:12 PM   Modules accepted: Orders

## 2023-01-14 ENCOUNTER — Other Ambulatory Visit: Payer: Medicare HMO

## 2023-01-20 NOTE — Telephone Encounter (Signed)
Agree with MD recommendation below. Would also recommend she see PCP today if she is able for evaluation. She's at such a low dose I wouldn't expect this to be causing any dramatic symptoms but OK to hold and see how it goes.

## 2023-01-26 ENCOUNTER — Encounter: Payer: Self-pay | Admitting: Physician Assistant

## 2023-01-26 NOTE — Progress Notes (Signed)
Cardiology Office Note    Date:  01/28/2023  ID:  Kathleen, Oliver Kathleen Oliver, MRN [id number] PCP:  Kathleen Ates, MD  Cardiologist:  Lance Muss, MD  Electrophysiologist:  None   Chief Complaint: f/u palpitations  History of Present Illness: .    Kathleen Oliver is a 77 y.o. female with visit-pertinent history of bronchial asthma, stroke, PVCs, moderate AI and LVH by echo, aortic atherosclerosis/coronary atherosclerosis, pulmonary nodules, GERD, hiatal hernia, HTN, HLD followed by PCP (on PCSK9i), obesity, DM who is seen for follow-up.   She was previously seen for shortness of breath and palpitations the latter felt due to PVCs. Remote cath 2011 showed no significant CAD. Last echo 2014 showed EF 60-65%, G1DD, mild MR, trivial AI. Cor CT 08/2019 showed CAC 0, no evidence of CAD, + aortic atherosclerosis, 2mm pulmonary nodules nonspecific but statistically likely benign. SOB was ultimately felt multifactorial due to deconditioning. Her PCP followed her lung nodules with CT 09/2021 showing no new nodules. This did show coronary calcification. She was recently seen in the office reporting an uptick in palpitations similar to when she had PVCs in the past. She thought this coincided with when her neighbors began smoking marijuana. Repeat echo 12/2022 showed EF 60-65%, severe asymmetric left ventricular hypertrophy of the basal-septal segment, trivial MR, moderate AI. Zio showed rare PACs/PVCs, range 43bpm (sleeping hours) to 143bpm, average 70bpm. I offered trial of low dose Toprol.  She returns for follow-up. She has been experiencing watery diarrhea for several weeks. She has h/o issues with loose stools but the watery component was new. She initially wrote in recently asking if it was the metoprolol. She was granted the OK to hold the medicine. Stopping metoprolol didn't really make a difference in the diarrhea. She also stopped magnesium supplementation in case it was causing issues. She did  self decrease her metformin to once a day with some improvement. She saw PCP yesterday whom she reports also initiated further workup with stool testing and CBC/BMET. She would like to go back on the low dose metoprolol as she feels it did help with her palpitations. Of note she reports a significant family history of heart disease and stroke. On her dad's side, she had a uncle die in his 28s of some sort of heart issue. Her dad died at 40 and had an autopsy and they were told that "the back of his heart blew out." Her dad's brother was also found dead in the garden one day in his 24s. A cousin on his side also died of some sort of undetermined heart issue in his 20s.   Labwork independently reviewed: 11/2022 Mg 1.8, TSH OK, Hgb/plt OK, K 4.1, Cr 0.98  ROS: .    Please see the history of present illness. All other systems are reviewed and otherwise negative.  Studies Reviewed: Marland Kitchen    EKG:  EKG is  not ordered today  CV Studies: Cardiac studies reviewed are outlined and summarized above. Otherwise please see EMR for full report.   Physical Exam:    VS:  BP 120/70   Pulse 62   Ht 5\' 1"  (1.549 m)   Wt 127 lb 12.8 oz (58 kg)   SpO2 98%   BMI 24.15 kg/m    Wt Readings from Last 3 Encounters:  01/28/23 127 lb 12.8 oz (58 kg)  12/15/22 131 lb 9.6 oz (59.7 kg)  11/25/22 131 lb (59.4 kg)    GEN: Well nourished, well developed in  no acute distress NECK: No JVD; No carotid bruits CARDIAC: RRR, no murmurs, rubs, gallops RESPIRATORY:  Clear to auscultation without rales, wheezing or rhonchi  ABDOMEN: Soft, non-tender, non-distended EXTREMITIES:  No edema; No acute deformity   Asessement and Plan:.    1. PVCs - very low burden by monitor. OK to resume metoprolol since stopping did not resolve watery diarrhea, undergoing w/u by PCP for this. Given the GI issues I'm not going to chase her magnesium level any further. Dietary intake advised.  2. Coronary/aortic atherosclerosis - no  accelerating anginal symptoms. She is on Plavix presumably for prior stroke, not presently on ASA. Lipids are managed by PCP with PCSK9i.   3. LVH with h/o HTN - I had discussed recent echo with Dr. Eldridge Dace who initially felt that this may be variant due to age. In speaking with her further, she does report a significant family history of cardiac death in the past with spotty details. Per shared decision making we'll pursue cMRI to exclude variant of HCM. She herself has not had syncope. Will request copy of CBC/BMET from yesterday's OV with PCP. BP is controlled. No obstructive type of symptoms.  4. Aortic insufficiency - moderate by echo 12/2022. Follow up cMRI as above to see if this gives [place] info on severity. If not, anticipate repeat echo 12/2023. Can arrange closer in follow-up.  5. History of stroke - patient inquires about carotid testing, will arrange duplex. She remains on Plavix through PCP for history of this.     Disposition: F/u with me in 4 months.  Signed, Laurann Montana, PA-C

## 2023-01-27 ENCOUNTER — Other Ambulatory Visit: Payer: Self-pay | Admitting: Internal Medicine

## 2023-01-27 DIAGNOSIS — R197 Diarrhea, unspecified: Secondary | ICD-10-CM | POA: Diagnosis not present

## 2023-01-27 DIAGNOSIS — R1900 Intra-abdominal and pelvic swelling, mass and lump, unspecified site: Secondary | ICD-10-CM | POA: Diagnosis not present

## 2023-01-28 ENCOUNTER — Encounter: Payer: Self-pay | Admitting: Physician Assistant

## 2023-01-28 ENCOUNTER — Ambulatory Visit: Payer: Medicare HMO | Attending: Physician Assistant | Admitting: Physician Assistant

## 2023-01-28 VITALS — BP 120/70 | HR 62 | Ht 61.0 in | Wt 127.8 lb

## 2023-01-28 DIAGNOSIS — Z8673 Personal history of transient ischemic attack (TIA), and cerebral infarction without residual deficits: Secondary | ICD-10-CM | POA: Diagnosis not present

## 2023-01-28 DIAGNOSIS — I700 Atherosclerosis of aorta: Secondary | ICD-10-CM | POA: Diagnosis not present

## 2023-01-28 DIAGNOSIS — I2584 Coronary atherosclerosis due to calcified coronary lesion: Secondary | ICD-10-CM | POA: Diagnosis not present

## 2023-01-28 DIAGNOSIS — I493 Ventricular premature depolarization: Secondary | ICD-10-CM

## 2023-01-28 DIAGNOSIS — I517 Cardiomegaly: Secondary | ICD-10-CM | POA: Diagnosis not present

## 2023-01-28 DIAGNOSIS — I2510 Atherosclerotic heart disease of native coronary artery without angina pectoris: Secondary | ICD-10-CM

## 2023-01-28 DIAGNOSIS — I351 Nonrheumatic aortic (valve) insufficiency: Secondary | ICD-10-CM

## 2023-01-28 DIAGNOSIS — I10 Essential (primary) hypertension: Secondary | ICD-10-CM

## 2023-01-28 NOTE — Patient Instructions (Signed)
Medication Instructions:  Your physician recommends that you continue on your current medications as directed. Please refer to the Current Medication list given to you today.  *If you need a refill on your cardiac medications before your next appointment, please call your pharmacy*  Testing/Procedures: Your physician has requested that you have a carotid duplex. This test is an ultrasound of the carotid arteries in your neck. It looks at blood flow through these arteries that supply the brain with blood. Allow one hour for this exam. There are no restrictions or special instructions.   Your physician has requested that you have a cardiac MRI. Cardiac MRI uses a computer to create images of your heart as its beating, producing both still and moving pictures of your heart and major blood vessels. For further information please visit [email or web address]. Please follow the instruction sheet given to you today for more information.    Follow-Up: At [facility], you and your health needs are our priority.  As part of our continuing mission to provide you with exceptional heart care, we have created designated Provider Care Teams.  These Care Teams include your primary Cardiologist (physician) and Advanced Practice Providers (APPs -  Physician Assistants and Nurse Practitioners) who all work together to provide you with the care you need, when you need it.   Your next appointment:   4 month(s)  Provider:   Ronie Spies, PA-C        Other Instructions   You are scheduled for Cardiac MRI on ______________. Please arrive for your appointment at ______________ ( arrive 30-45 minutes prior to test start time). ?  [facility] [address] [place], [place] [address] [phone] Please take advantage of the free valet parking available at the [facility] and [employer] (Entrance C).  Proceed to the [facility] Radiology Department (First Floor) for check-in.    Magnetic  resonance imaging (MRI) is a painless test that produces images of the inside of the body without using Xrays.  During an MRI, strong magnets and radio waves work together in a Data processing manager to form detailed images.   MRI images may provide more details about a medical condition than X-rays, CT scans, and ultrasounds can provide.  You may be given earphones to listen for instructions.  You may eat a light breakfast and take medications as ordered with the exception of furosemide, hydrochlorothiazide, or spironolactone(fluid pill, other). Please avoid stimulants for 12 hr prior to test. (Ie. Caffeine, nicotine, chocolate, or antihistamine medications)  An IV will be inserted into one of your veins. Contrast material will be injected into your IV. It will leave your body through your urine within a day. You may be told to drink plenty of fluids to help flush the contrast material out of your system.  You will be asked to remove all metal, including: Watch, jewelry, and other metal objects including hearing aids, hair pieces and dentures. Also wearable glucose monitoring systems (ie. Freestyle Libre and Omnipods) (Braces and fillings normally are not a problem.)   TEST WILL TAKE APPROXIMATELY 1 HOUR  PLEASE NOTIFY SCHEDULING AT LEAST 24 HOURS IN ADVANCE IF YOU ARE UNABLE TO KEEP YOUR APPOINTMENT. [phone]  For more information and frequently asked questions, please visit our website : [email or web address]  Please call the Cardiac Imaging Nurse Navigators with any questions/concerns. [phone] Office

## 2023-01-31 DIAGNOSIS — R197 Diarrhea, unspecified: Secondary | ICD-10-CM | POA: Diagnosis not present

## 2023-02-03 LAB — LAB REPORT - SCANNED: EGFR: 69

## 2023-02-07 ENCOUNTER — Ambulatory Visit
Admission: RE | Admit: 2023-02-07 | Discharge: 2023-02-07 | Disposition: A | Discharge status: Home / Self Care | Payer: Medicare HMO | Source: Ambulatory Visit | Attending: Internal Medicine | Admitting: Internal Medicine

## 2023-02-07 DIAGNOSIS — R197 Diarrhea, unspecified: Secondary | ICD-10-CM

## 2023-02-07 MED ORDER — IOPAMIDOL (ISOVUE-300) INJECTION 61%
100.0000 mL | Freq: Once | INTRAVENOUS | Status: AC | PRN
  Administered 2023-02-07: 100 mL via INTRAVENOUS

## 2023-02-10 ENCOUNTER — Ambulatory Visit (HOSPITAL_COMMUNITY)
Admission: RE | Admit: 2023-02-10 | Discharge: 2023-02-10 | Disposition: A | Discharge status: Home / Self Care | Payer: Medicare HMO | Source: Ambulatory Visit | Attending: Physician Assistant | Admitting: Physician Assistant

## 2023-02-10 DIAGNOSIS — I10 Essential (primary) hypertension: Secondary | ICD-10-CM | POA: Diagnosis not present

## 2023-02-10 DIAGNOSIS — I700 Atherosclerosis of aorta: Secondary | ICD-10-CM | POA: Diagnosis not present

## 2023-02-10 DIAGNOSIS — I2584 Coronary atherosclerosis due to calcified coronary lesion: Secondary | ICD-10-CM | POA: Insufficient documentation

## 2023-02-10 DIAGNOSIS — I493 Ventricular premature depolarization: Secondary | ICD-10-CM | POA: Diagnosis not present

## 2023-02-10 DIAGNOSIS — I2510 Atherosclerotic heart disease of native coronary artery without angina pectoris: Secondary | ICD-10-CM | POA: Insufficient documentation

## 2023-02-10 DIAGNOSIS — I351 Nonrheumatic aortic (valve) insufficiency: Secondary | ICD-10-CM | POA: Diagnosis not present

## 2023-02-10 DIAGNOSIS — Z8673 Personal history of transient ischemic attack (TIA), and cerebral infarction without residual deficits: Secondary | ICD-10-CM | POA: Insufficient documentation

## 2023-02-10 DIAGNOSIS — I517 Cardiomegaly: Secondary | ICD-10-CM | POA: Diagnosis not present

## 2023-02-16 DIAGNOSIS — H353131 Nonexudative age-related macular degeneration, bilateral, early dry stage: Secondary | ICD-10-CM | POA: Diagnosis not present

## 2023-02-16 DIAGNOSIS — H401131 Primary open-angle glaucoma, bilateral, mild stage: Secondary | ICD-10-CM | POA: Diagnosis not present

## 2023-02-17 DIAGNOSIS — R197 Diarrhea, unspecified: Secondary | ICD-10-CM | POA: Diagnosis not present

## 2023-02-17 DIAGNOSIS — R1010 Upper abdominal pain, unspecified: Secondary | ICD-10-CM | POA: Diagnosis not present

## 2023-03-04 DIAGNOSIS — R109 Unspecified abdominal pain: Secondary | ICD-10-CM | POA: Diagnosis not present

## 2023-03-04 DIAGNOSIS — Z86010 Personal history of colonic polyps: Secondary | ICD-10-CM | POA: Diagnosis not present

## 2023-03-04 DIAGNOSIS — R197 Diarrhea, unspecified: Secondary | ICD-10-CM | POA: Diagnosis not present

## 2023-03-09 DIAGNOSIS — R109 Unspecified abdominal pain: Secondary | ICD-10-CM | POA: Diagnosis not present

## 2023-03-17 DIAGNOSIS — R109 Unspecified abdominal pain: Secondary | ICD-10-CM | POA: Diagnosis not present

## 2023-03-17 DIAGNOSIS — R197 Diarrhea, unspecified: Secondary | ICD-10-CM | POA: Diagnosis not present

## 2023-03-24 DIAGNOSIS — R6881 Early satiety: Secondary | ICD-10-CM | POA: Diagnosis not present

## 2023-03-24 DIAGNOSIS — R197 Diarrhea, unspecified: Secondary | ICD-10-CM | POA: Diagnosis not present

## 2023-03-24 DIAGNOSIS — K439 Ventral hernia without obstruction or gangrene: Secondary | ICD-10-CM | POA: Diagnosis not present

## 2023-04-04 DIAGNOSIS — E7800 Pure hypercholesterolemia, unspecified: Secondary | ICD-10-CM | POA: Diagnosis not present

## 2023-04-04 DIAGNOSIS — Z1331 Encounter for screening for depression: Secondary | ICD-10-CM | POA: Diagnosis not present

## 2023-04-04 DIAGNOSIS — Z79899 Other long term (current) drug therapy: Secondary | ICD-10-CM | POA: Diagnosis not present

## 2023-04-04 DIAGNOSIS — K219 Gastro-esophageal reflux disease without esophagitis: Secondary | ICD-10-CM | POA: Diagnosis not present

## 2023-04-04 DIAGNOSIS — E1142 Type 2 diabetes mellitus with diabetic polyneuropathy: Secondary | ICD-10-CM | POA: Diagnosis not present

## 2023-04-04 DIAGNOSIS — H353131 Nonexudative age-related macular degeneration, bilateral, early dry stage: Secondary | ICD-10-CM | POA: Diagnosis not present

## 2023-04-04 DIAGNOSIS — E559 Vitamin D deficiency, unspecified: Secondary | ICD-10-CM | POA: Diagnosis not present

## 2023-04-04 DIAGNOSIS — R197 Diarrhea, unspecified: Secondary | ICD-10-CM | POA: Diagnosis not present

## 2023-04-04 DIAGNOSIS — I700 Atherosclerosis of aorta: Secondary | ICD-10-CM | POA: Diagnosis not present

## 2023-04-04 DIAGNOSIS — Z23 Encounter for immunization: Secondary | ICD-10-CM | POA: Diagnosis not present

## 2023-04-04 DIAGNOSIS — Z0000 Encounter for general adult medical examination without abnormal findings: Secondary | ICD-10-CM | POA: Diagnosis not present

## 2023-04-04 DIAGNOSIS — I10 Essential (primary) hypertension: Secondary | ICD-10-CM | POA: Diagnosis not present

## 2023-04-11 ENCOUNTER — Encounter (HOSPITAL_COMMUNITY): Payer: Self-pay

## 2023-04-12 ENCOUNTER — Telehealth (HOSPITAL_COMMUNITY): Payer: Self-pay | Admitting: *Deleted

## 2023-04-12 NOTE — Telephone Encounter (Signed)
Reaching out to patient to offer assistance regarding upcoming cardiac imaging study; pt verbalizes understanding of appt date/time, parking situation and where to check in, and verified current allergies; name and call back number provided for further questions should they arise  Larey Brick RN Navigator Cardiac Imaging [facility] [phone] office [phone] cell  Patient states she had an MRI in the past without incident.

## 2023-04-12 NOTE — Telephone Encounter (Addendum)
Attempted to call patient regarding upcoming cardiac MRI appointment. Left message on voicemail with name and callback number Johney Frame RN Navigator Cardiac Imaging [facility] [phone] Office

## 2023-04-13 ENCOUNTER — Other Ambulatory Visit: Payer: Self-pay | Admitting: Physician Assistant

## 2023-04-13 ENCOUNTER — Ambulatory Visit (HOSPITAL_COMMUNITY)
Admission: RE | Admit: 2023-04-13 | Discharge: 2023-04-13 | Disposition: A | Discharge status: Home / Self Care | Payer: Medicare HMO | Source: Ambulatory Visit | Attending: Physician Assistant | Admitting: Physician Assistant

## 2023-04-13 DIAGNOSIS — Z8673 Personal history of transient ischemic attack (TIA), and cerebral infarction without residual deficits: Secondary | ICD-10-CM

## 2023-04-13 DIAGNOSIS — I700 Atherosclerosis of aorta: Secondary | ICD-10-CM | POA: Diagnosis not present

## 2023-04-13 DIAGNOSIS — I517 Cardiomegaly: Secondary | ICD-10-CM

## 2023-04-13 DIAGNOSIS — I2510 Atherosclerotic heart disease of native coronary artery without angina pectoris: Secondary | ICD-10-CM | POA: Diagnosis not present

## 2023-04-13 DIAGNOSIS — I10 Essential (primary) hypertension: Secondary | ICD-10-CM

## 2023-04-13 DIAGNOSIS — I351 Nonrheumatic aortic (valve) insufficiency: Secondary | ICD-10-CM | POA: Diagnosis not present

## 2023-04-13 DIAGNOSIS — I493 Ventricular premature depolarization: Secondary | ICD-10-CM

## 2023-04-13 MED ORDER — GADOBUTROL 1 MMOL/ML IV SOLN
10.0000 mL | Freq: Once | INTRAVENOUS | Status: AC | PRN
  Administered 2023-04-13: 10 mL via INTRAVENOUS

## 2023-04-20 DIAGNOSIS — D225 Melanocytic nevi of trunk: Secondary | ICD-10-CM | POA: Diagnosis not present

## 2023-04-20 DIAGNOSIS — L814 Other melanin hyperpigmentation: Secondary | ICD-10-CM | POA: Diagnosis not present

## 2023-04-20 DIAGNOSIS — Z85828 Personal history of other malignant neoplasm of skin: Secondary | ICD-10-CM | POA: Diagnosis not present

## 2023-04-20 DIAGNOSIS — L821 Other seborrheic keratosis: Secondary | ICD-10-CM | POA: Diagnosis not present

## 2023-04-20 DIAGNOSIS — Z08 Encounter for follow-up examination after completed treatment for malignant neoplasm: Secondary | ICD-10-CM | POA: Diagnosis not present

## 2023-04-20 DIAGNOSIS — L988 Other specified disorders of the skin and subcutaneous tissue: Secondary | ICD-10-CM | POA: Diagnosis not present

## 2023-04-22 ENCOUNTER — Ambulatory Visit: Payer: Medicare HMO | Admitting: Podiatry

## 2023-04-22 DIAGNOSIS — M542 Cervicalgia: Secondary | ICD-10-CM | POA: Diagnosis not present

## 2023-04-22 DIAGNOSIS — S161XXA Strain of muscle, fascia and tendon at neck level, initial encounter: Secondary | ICD-10-CM | POA: Diagnosis not present

## 2023-05-04 ENCOUNTER — Ambulatory Visit: Payer: Medicare HMO | Admitting: Podiatry

## 2023-05-04 ENCOUNTER — Encounter: Payer: Self-pay | Admitting: Podiatry

## 2023-05-04 DIAGNOSIS — E119 Type 2 diabetes mellitus without complications: Secondary | ICD-10-CM | POA: Diagnosis not present

## 2023-05-04 DIAGNOSIS — B351 Tinea unguium: Secondary | ICD-10-CM | POA: Diagnosis not present

## 2023-05-04 DIAGNOSIS — M79674 Pain in right toe(s): Secondary | ICD-10-CM

## 2023-05-04 DIAGNOSIS — M79675 Pain in left toe(s): Secondary | ICD-10-CM | POA: Diagnosis not present

## 2023-05-04 NOTE — Progress Notes (Addendum)
This patient presents to the office with chief complaint of long thick nails and diabetic feet.  This patient  says there  is  no pain and discomfort in her feet.  This patient says there are long thick painful big toenails.  These nails are painful walking and wearing shoes.  Patient has no history of infection or drainage from both feet.  Patient is unable to  self treat his own nails . This patient presents  to the office today for treatment of the  long nails and a foot evaluation due to history of  diabetes..  She also has coagulation defect due to plavix.  General Appearance  Alert, conversant and in no acute stress.  Vascular  Dorsalis pedis and posterior tibial  pulses are palpable  bilaterally.  Capillary return is within normal limits  bilaterally. Temperature is within normal limits  bilaterally.  Neurologic  Senn-Weinstein monofilament wire test within normal limits  bilaterally. Muscle power within normal limits bilaterally.  Nails Thick disfigured discolored nails with subungual debris  hallux nails  bilaterally. No evidence of bacterial infection or drainage bilaterally.  Orthopedic  No limitations of motion of motion feet .  No crepitus or effusions noted.   HAV  B/L with hammer toes  B/L.  Skin  normotropic skin with no porokeratosis noted bilaterally.  No signs of infections or ulcers noted.     Onychomycosis  Diabetes with no foot complications  IE  Debride nails x 10.  A diabetic foot exam was performed and there is no evidence of any vascular or neurologic pathology.   RTC 4 months.   Helane Gunther DPM

## 2023-05-05 DIAGNOSIS — K469 Unspecified abdominal hernia without obstruction or gangrene: Secondary | ICD-10-CM | POA: Diagnosis not present

## 2023-05-05 DIAGNOSIS — R140 Abdominal distension (gaseous): Secondary | ICD-10-CM | POA: Diagnosis not present

## 2023-05-10 DIAGNOSIS — J069 Acute upper respiratory infection, unspecified: Secondary | ICD-10-CM | POA: Diagnosis not present

## 2023-05-10 DIAGNOSIS — I10 Essential (primary) hypertension: Secondary | ICD-10-CM | POA: Diagnosis not present

## 2023-05-10 DIAGNOSIS — Z03818 Encounter for observation for suspected exposure to other biological agents ruled out: Secondary | ICD-10-CM | POA: Diagnosis not present

## 2023-05-11 DIAGNOSIS — H6123 Impacted cerumen, bilateral: Secondary | ICD-10-CM | POA: Diagnosis not present

## 2023-06-01 DIAGNOSIS — K432 Incisional hernia without obstruction or gangrene: Secondary | ICD-10-CM | POA: Diagnosis not present

## 2023-06-01 DIAGNOSIS — K644 Residual hemorrhoidal skin tags: Secondary | ICD-10-CM | POA: Diagnosis not present

## 2023-06-06 DIAGNOSIS — M7989 Other specified soft tissue disorders: Secondary | ICD-10-CM | POA: Diagnosis not present

## 2023-06-06 DIAGNOSIS — I10 Essential (primary) hypertension: Secondary | ICD-10-CM | POA: Diagnosis not present

## 2023-06-07 ENCOUNTER — Ambulatory Visit: Payer: Medicare HMO | Admitting: Physician Assistant

## 2023-06-08 NOTE — Progress Notes (Unsigned)
Cardiology Office Note    Date:  06/09/2023  ID:  Kathleen Oliver, DOB Feb 03, 1946, MRN [id number] PCP:  Thana Ates, MD  Cardiologist:  Lance Muss, MD  Electrophysiologist:  None   Chief Complaint: f/u PVCs, AI  History of Present Illness: .    Kathleen Oliver is a 77 y.o. female with visit-pertinent history of  bronchial asthma, stroke, PVCs, moderate AI, LVH,  aortic atherosclerosis/coronary atherosclerosis, pulmonary nodules followed by PCP, GERD, hiatal hernia, HTN, HLD followed by PCP (on PCSK9i), obesity, DM, mild carotid artery disease (1-39% BICA 01/2023) who is seen for follow-up.    She was previously seen for shortness of breath and palpitations the latter felt due to PVCs. Remote cath 2011 showed no significant CAD. Last echo 2014 showed EF 60-65%, G1DD, mild MR, trivial AI. Cor CT 08/2019 showed CAC 0, no evidence of CAD, + aortic atherosclerosis, 2mm pulmonary nodules nonspecific but statistically likely benign. SOB was ultimately felt multifactorial due to deconditioning. Her PCP followed her lung nodules with CT 09/2021 showing no new nodules.; this did show coronary calcification. She was recently seen in the office reporting an uptick in palpitations similar to when she had PVCs in the past. She thought this coincided with when her neighbors began smoking marijuana. Repeat echo 12/2022 showed EF 60-65%, severe asymmetric left ventricular hypertrophy of the basal-septal segment, trivial MR, moderate AI. Zio showed rare PACs/PVCs, range 43bpm (sleeping hours) to 143bpm, average 70bpm. I offered trial of low dose Toprol. She had issues with diarrhea she originally thought was due to metoprolol but this persisted even after cessation. Due to family history of SCD, we pursued cMRI last OV showing normal EF 69%, mild LVH, visually moderate AI but only trivial by flow calculation, borderline elevated ECV, not suggestive of HCM - more likely "senile septum" variant - I reviewed with Dr.  Izora Ribas who agreed this was a benign finding.  She returns for follow-up today. She has been having issues with BP variability. In November her hydrochlorothiazide was stopped due to low BP. She had been sick around that time. Within a few weeks her blood pressure began to increase so her PCP doubled her losartan to 100mg  daily. She had an unusual episode of right hand swelling on Sunday and saw PCP on Monday for this. She had anti-CCP checked which was normal. This spontaneously resolved. However, her blood pressure has continued to run high at home, one day 180 systolic. She brings in a list of readings which are frequently >130 systolic. She mentions PCP had mentioned increasing amlodipine but she preferred to talk to me first. In general she is doing well from cardiac standpoint without any chest pain or SOB. She continues to have fleeting skipping sensation without intensification or sustained palpitations. She also reports she's being followed by GI for ongoing issues with diarrhea and weight loss.  Labwork independently reviewed: 11/2022 Mg 1.8, TSH OK, Hgb/plt OK, K 4.1, Cr 0.98   ROS: .    Please see the history of present illness.  All other systems are reviewed and otherwise negative.  Studies Reviewed: Marland Kitchen    EKG:  EKG is ordered today, personally reviewed, demonstrating SB 5bpm, nonspecific STTW changes stable from prior  CV Studies: Cardiac studies reviewed are outlined and summarized above. Otherwise please see EMR for full report.   Current Reported Medications:.    Current Meds  Medication Sig   ACCU-CHEK AVIVA PLUS test strip    amLODipine (NORVASC) 5 MG  tablet SMARTSIG:1 Tablet(s) By Mouth Every Evening   ANUCORT-HC 25 MG suppository Place 25 mg rectally daily as needed.   Cholecalciferol (VITAMIN D) 2000 UNITS CAPS Take 2,000 Units by mouth daily.   clopidogrel (PLAVIX) 75 MG tablet Take 75 mg by mouth at bedtime.    Evolocumab with Infusor 420 MG/3.5ML SOCT Inject  120-130 mg into the skin every 14 (fourteen) days.   gabapentin (NEURONTIN) 100 MG capsule Take 100 mg by mouth at bedtime.   latanoprost (XALATAN) 0.005 % ophthalmic solution Place 1 drop into both eyes at bedtime.    lidocaine (LMX) 4 % cream Apply 1 application topically as needed (pain).   Lidocaine 4 % PTCH Apply 1 patch topically daily as needed (pain).   loperamide (IMODIUM) 1 MG/5ML solution Take 1 mg by mouth as needed for diarrhea or loose stools.   losartan (COZAAR) 100 MG tablet Take 100 mg by mouth daily.   metFORMIN (GLUCOPHAGE) 500 MG tablet Take 500 mg by mouth in the morning and at bedtime.   methocarbamol (ROBAXIN) 500 MG tablet Take 1 tablet (500 mg total) by mouth 3 (three) times daily as needed.   metoprolol succinate (TOPROL XL) 25 MG 24 hr tablet Take 0.5 tablets (12.5 mg total) by mouth daily.   pantoprazole (PROTONIX) 20 MG tablet Take 20 mg by mouth 2 (two) times daily.   Polyethyl Glycol-Propyl Glycol (SYSTANE) 0.4-0.3 % GEL ophthalmic gel Place 1 application into both eyes at bedtime. (Patient taking differently: Place 1 application  into both eyes as needed (dry eyes).)   Simethicone (GAS-X PO) Take 1 capsule by mouth daily as needed (abdominal pain).   sodium chloride (OCEAN) 0.65 % SOLN nasal spray Place 1 spray into both nostrils daily as needed for congestion.    traMADol (ULTRAM) 50 MG tablet Take 50 mg by mouth every 6 (six) hours as needed (Back pain.).    [DISCONTINUED] hydrochlorothiazide (HYDRODIURIL) 12.5 MG tablet Take 12.5 mg by mouth every morning.   [DISCONTINUED] losartan (COZAAR) 50 MG tablet Take 1 tablet (50 mg total) by mouth daily. And check Blood Pressure    Physical Exam:    VS:  BP 138/70   Pulse (!) 51   Ht 5\' 1"  (1.549 m)   Wt 119 lb (54 kg)   SpO2 98%   BMI 22.48 kg/m    Wt Readings from Last 3 Encounters:  06/09/23 119 lb (54 kg)  01/28/23 127 lb 12.8 oz (58 kg)  12/15/22 131 lb 9.6 oz (59.7 kg)    GEN: Well nourished, well  developed in no acute distress NECK: No JVD; No carotid bruits CARDIAC: RRR, no murmurs, rubs, gallops RESPIRATORY:  Clear to auscultation without rales, wheezing or rhonchi  ABDOMEN: Soft, non-tender, non-distended EXTREMITIES:  No edema; No acute deformity   Asessement and Plan:.    1. PVCs - quiescent by exam and EKG. Continue low dose metoprolol as tolerated, unable to titrate dose due to baseline sinus bradycardia. Overall stable. Getting f/u BMET/TSH with labs given BP issues. Have not changed Mg goal given patient's inability to tolerate supplementation. Her level was previously around 1.8.  2. Coronary/aortic atherosclerosis - no accelerating anginal symptoms. She is on Plavix prescribed by outside office presumably for hx of stroke, not presently on ASA. Lipids are managed by PCP with PCSK9i.  3. LVH with h/o HTN - cMRI reviewed as above, felt to be normal variant for age. BP has been a moving target recently. Increase amlodipine to 10mg  daily  with patient to submit BP readings in 1 week. I told her that given her other issues with her recent hand swelling, weight loss, diarrhea, that her HTN can be managed by PCP between now and next visit, and I would advise f/u within a few weeks to review trends. I do not specifically think that cardiology needs to manage her HTN but we are happy to help in any way going forward. Getting f/u BMET, TSH today.  4. Aortic insufficiency - recheck echo 12/2023 with visit to follow.  5. Mild carotid artery disease - continue lipid management with PCP, no acute f/u needed for this. Consider recheck in 2 years.    Disposition: F/u with Dr. Izora Ribas to establish care in 12/2023 after surveillance echo for AI.  Signed, Laurann Montana, PA-C

## 2023-06-09 ENCOUNTER — Ambulatory Visit: Payer: Medicare HMO | Attending: Physician Assistant | Admitting: Physician Assistant

## 2023-06-09 ENCOUNTER — Encounter: Payer: Self-pay | Admitting: Physician Assistant

## 2023-06-09 VITALS — BP 138/70 | HR 51 | Ht 61.0 in | Wt 119.0 lb

## 2023-06-09 DIAGNOSIS — I517 Cardiomegaly: Secondary | ICD-10-CM

## 2023-06-09 DIAGNOSIS — I779 Disorder of arteries and arterioles, unspecified: Secondary | ICD-10-CM

## 2023-06-09 DIAGNOSIS — I10 Essential (primary) hypertension: Secondary | ICD-10-CM | POA: Diagnosis not present

## 2023-06-09 DIAGNOSIS — I351 Nonrheumatic aortic (valve) insufficiency: Secondary | ICD-10-CM | POA: Diagnosis not present

## 2023-06-09 DIAGNOSIS — I2510 Atherosclerotic heart disease of native coronary artery without angina pectoris: Secondary | ICD-10-CM

## 2023-06-09 DIAGNOSIS — I493 Ventricular premature depolarization: Secondary | ICD-10-CM

## 2023-06-09 DIAGNOSIS — I700 Atherosclerosis of aorta: Secondary | ICD-10-CM

## 2023-06-09 MED ORDER — AMLODIPINE BESYLATE 10 MG PO TABS: 10.0000 mg | ORAL_TABLET | Freq: Every day | ORAL | 3 refills | Status: DC

## 2023-06-09 NOTE — Patient Instructions (Signed)
Medication Instructions:  INCREASE AMLODIPINE TO 10 MG A DAY   *If you need a refill on your cardiac medications before your next appointment, please call your pharmacy*   Lab Work: BMET, TSH  If you have labs (blood work) drawn today and your tests are completely normal, you will receive your results only by: MyChart Message (if you have MyChart) OR A paper copy in the mail If you have any lab test that is abnormal or we need to change your treatment, we will call you to review the results.   Testing/Procedures:July 2025 Your physician has requested that you have an echocardiogram. Echocardiography is a painless test that uses sound waves to create images of your heart. It provides your doctor with information about the size and shape of your heart and how well your heart's chambers and valves are working. This procedure takes approximately one hour. There are no restrictions for this procedure. Please do NOT wear cologne, perfume, aftershave, or lotions (deodorant is allowed). Please arrive 15 minutes prior to your appointment time.  Please note: We ask at that you not bring children with you during ultrasound (echo/ vascular) testing. Due to room size and safety concerns, children are not allowed in the ultrasound rooms during exams. Our front office staff cannot provide observation of children in our lobby area while testing is being conducted. An adult accompanying a patient to their appointment will only be allowed in the ultrasound room at the discretion of the ultrasound technician under special circumstances. We apologize for any inconvenience.    Follow-Up: At [facility], you and your health needs are our priority.  As part of our continuing mission to provide you with exceptional heart care, we have created designated Provider Care Teams.  These Care Teams include your primary Cardiologist (physician) and Advanced Practice Providers (APPs -  Physician Assistants and  Nurse Practitioners) who all work together to provide you with the care you need, when you need it.  We recommend signing up for the patient portal called "MyChart".  Sign up information is provided on this After Visit Summary.  MyChart is used to connect with patients for Virtual Visits (Telemedicine).  Patients are able to view lab/test results, encounter notes, upcoming appointments, etc.  Non-urgent messages can be sent to your provider as well.   To learn more about what you can do with MyChart, go to [email or web address].    Your next appointment:  Dr Izora Ribas after echo in July 2025  CHECK YOUR BP STARTING IN ABOUT A WEEK AND ABOUT 3 HOURS AFTER TAKING YOUR BP MEDS

## 2023-06-10 LAB — BASIC METABOLIC PANEL
BUN/Creatinine Ratio: 21 (ref 12–28)
BUN: 18 mg/dL (ref 8–27)
CO2: 24 mmol/L (ref 20–29)
Calcium: 9.9 mg/dL (ref 8.7–10.3)
Chloride: 101 mmol/L (ref 96–106)
Creatinine, Ser: 0.86 mg/dL (ref 0.57–1.00)
Glucose: 104 mg/dL — ABNORMAL HIGH (ref 70–99)
Potassium: 4.2 mmol/L (ref 3.5–5.2)
Sodium: 142 mmol/L (ref 134–144)
eGFR: 70 mL/min/{1.73_m2} (ref >59)

## 2023-06-10 LAB — TSH: TSH: 1.46 u[IU]/mL (ref 0.450–4.500)

## 2023-06-20 ENCOUNTER — Ambulatory Visit: Payer: Medicare HMO | Admitting: Allergy

## 2023-07-29 DIAGNOSIS — Z9889 Other specified postprocedural states: Secondary | ICD-10-CM | POA: Diagnosis not present

## 2023-07-29 DIAGNOSIS — Z8719 Personal history of other diseases of the digestive system: Secondary | ICD-10-CM | POA: Diagnosis not present

## 2023-09-01 ENCOUNTER — Encounter: Payer: Self-pay | Admitting: Podiatry

## 2023-09-01 ENCOUNTER — Ambulatory Visit: Payer: Medicare HMO | Admitting: Podiatry

## 2023-09-01 VITALS — Ht 61.0 in | Wt 119.0 lb

## 2023-09-01 DIAGNOSIS — B351 Tinea unguium: Secondary | ICD-10-CM

## 2023-09-01 DIAGNOSIS — E119 Type 2 diabetes mellitus without complications: Secondary | ICD-10-CM

## 2023-09-01 DIAGNOSIS — M79675 Pain in left toe(s): Secondary | ICD-10-CM

## 2023-09-01 DIAGNOSIS — M79674 Pain in right toe(s): Secondary | ICD-10-CM

## 2023-09-01 NOTE — Progress Notes (Signed)

## 2023-09-06 MED ORDER — AMLODIPINE BESYLATE 2.5 MG PO TABS: 7.5000 mg | ORAL_TABLET | Freq: Every day | ORAL | 3 refills | Status: DC

## 2023-09-12 DIAGNOSIS — R1013 Epigastric pain: Secondary | ICD-10-CM | POA: Diagnosis not present

## 2023-10-05 DIAGNOSIS — I10 Essential (primary) hypertension: Secondary | ICD-10-CM | POA: Diagnosis not present

## 2023-10-05 DIAGNOSIS — M19041 Primary osteoarthritis, right hand: Secondary | ICD-10-CM | POA: Diagnosis not present

## 2023-10-05 DIAGNOSIS — E1140 Type 2 diabetes mellitus with diabetic neuropathy, unspecified: Secondary | ICD-10-CM | POA: Diagnosis not present

## 2023-10-05 DIAGNOSIS — M19042 Primary osteoarthritis, left hand: Secondary | ICD-10-CM | POA: Diagnosis not present

## 2023-10-05 DIAGNOSIS — E1142 Type 2 diabetes mellitus with diabetic polyneuropathy: Secondary | ICD-10-CM | POA: Diagnosis not present

## 2023-10-05 DIAGNOSIS — K219 Gastro-esophageal reflux disease without esophagitis: Secondary | ICD-10-CM | POA: Diagnosis not present

## 2023-10-05 DIAGNOSIS — M542 Cervicalgia: Secondary | ICD-10-CM | POA: Diagnosis not present

## 2023-10-18 DIAGNOSIS — E119 Type 2 diabetes mellitus without complications: Secondary | ICD-10-CM | POA: Diagnosis not present

## 2023-10-18 DIAGNOSIS — H52222 Regular astigmatism, left eye: Secondary | ICD-10-CM | POA: Diagnosis not present

## 2023-10-19 DIAGNOSIS — Z08 Encounter for follow-up examination after completed treatment for malignant neoplasm: Secondary | ICD-10-CM | POA: Diagnosis not present

## 2023-10-19 DIAGNOSIS — L988 Other specified disorders of the skin and subcutaneous tissue: Secondary | ICD-10-CM | POA: Diagnosis not present

## 2023-10-19 DIAGNOSIS — L821 Other seborrheic keratosis: Secondary | ICD-10-CM | POA: Diagnosis not present

## 2023-10-19 DIAGNOSIS — Z7189 Other specified counseling: Secondary | ICD-10-CM | POA: Diagnosis not present

## 2023-10-19 DIAGNOSIS — D229 Melanocytic nevi, unspecified: Secondary | ICD-10-CM | POA: Diagnosis not present

## 2023-10-19 DIAGNOSIS — L814 Other melanin hyperpigmentation: Secondary | ICD-10-CM | POA: Diagnosis not present

## 2023-10-19 DIAGNOSIS — Z85828 Personal history of other malignant neoplasm of skin: Secondary | ICD-10-CM | POA: Diagnosis not present

## 2023-10-19 DIAGNOSIS — L304 Erythema intertrigo: Secondary | ICD-10-CM | POA: Diagnosis not present

## 2023-11-17 DIAGNOSIS — Z1231 Encounter for screening mammogram for malignant neoplasm of breast: Secondary | ICD-10-CM | POA: Diagnosis not present

## 2023-11-29 DIAGNOSIS — M546 Pain in thoracic spine: Secondary | ICD-10-CM | POA: Diagnosis not present

## 2023-11-29 DIAGNOSIS — M791 Myalgia, unspecified site: Secondary | ICD-10-CM | POA: Diagnosis not present

## 2023-12-08 ENCOUNTER — Ambulatory Visit: Admitting: Podiatry

## 2023-12-08 ENCOUNTER — Encounter: Payer: Self-pay | Admitting: Podiatry

## 2023-12-08 DIAGNOSIS — E119 Type 2 diabetes mellitus without complications: Secondary | ICD-10-CM

## 2023-12-08 DIAGNOSIS — B351 Tinea unguium: Secondary | ICD-10-CM | POA: Diagnosis not present

## 2023-12-08 DIAGNOSIS — M79674 Pain in right toe(s): Secondary | ICD-10-CM

## 2023-12-08 DIAGNOSIS — M79675 Pain in left toe(s): Secondary | ICD-10-CM

## 2023-12-08 NOTE — Progress Notes (Signed)

## 2023-12-09 DIAGNOSIS — G629 Polyneuropathy, unspecified: Secondary | ICD-10-CM | POA: Diagnosis not present

## 2023-12-09 DIAGNOSIS — E559 Vitamin D deficiency, unspecified: Secondary | ICD-10-CM | POA: Diagnosis not present

## 2023-12-09 DIAGNOSIS — M791 Myalgia, unspecified site: Secondary | ICD-10-CM | POA: Diagnosis not present

## 2023-12-09 DIAGNOSIS — R29898 Other symptoms and signs involving the musculoskeletal system: Secondary | ICD-10-CM | POA: Diagnosis not present

## 2023-12-09 DIAGNOSIS — R3589 Other polyuria: Secondary | ICD-10-CM | POA: Diagnosis not present

## 2023-12-26 DIAGNOSIS — M25551 Pain in right hip: Secondary | ICD-10-CM | POA: Diagnosis not present

## 2023-12-26 DIAGNOSIS — M25532 Pain in left wrist: Secondary | ICD-10-CM | POA: Diagnosis not present

## 2023-12-28 ENCOUNTER — Other Ambulatory Visit: Payer: Self-pay

## 2023-12-28 ENCOUNTER — Emergency Department (HOSPITAL_BASED_OUTPATIENT_CLINIC_OR_DEPARTMENT_OTHER)

## 2023-12-28 ENCOUNTER — Encounter (HOSPITAL_BASED_OUTPATIENT_CLINIC_OR_DEPARTMENT_OTHER): Payer: Self-pay | Admitting: Emergency Medicine

## 2023-12-28 ENCOUNTER — Emergency Department (HOSPITAL_BASED_OUTPATIENT_CLINIC_OR_DEPARTMENT_OTHER)
Admission: EM | Admit: 2023-12-28 | Discharge: 2023-12-28 | Disposition: A | Discharge status: Home / Self Care | Source: Ambulatory Visit

## 2023-12-28 DIAGNOSIS — I2510 Atherosclerotic heart disease of native coronary artery without angina pectoris: Secondary | ICD-10-CM | POA: Insufficient documentation

## 2023-12-28 DIAGNOSIS — M6281 Muscle weakness (generalized): Secondary | ICD-10-CM | POA: Diagnosis not present

## 2023-12-28 DIAGNOSIS — M19012 Primary osteoarthritis, left shoulder: Secondary | ICD-10-CM | POA: Diagnosis present

## 2023-12-28 DIAGNOSIS — Z79899 Other long term (current) drug therapy: Secondary | ICD-10-CM | POA: Insufficient documentation

## 2023-12-28 DIAGNOSIS — Z7984 Long term (current) use of oral hypoglycemic drugs: Secondary | ICD-10-CM | POA: Diagnosis not present

## 2023-12-28 DIAGNOSIS — M19042 Primary osteoarthritis, left hand: Secondary | ICD-10-CM | POA: Diagnosis not present

## 2023-12-28 DIAGNOSIS — K219 Gastro-esophageal reflux disease without esophagitis: Secondary | ICD-10-CM | POA: Diagnosis present

## 2023-12-28 DIAGNOSIS — M160 Bilateral primary osteoarthritis of hip: Secondary | ICD-10-CM | POA: Diagnosis not present

## 2023-12-28 DIAGNOSIS — Z880 Allergy status to penicillin: Secondary | ICD-10-CM | POA: Diagnosis not present

## 2023-12-28 DIAGNOSIS — E119 Type 2 diabetes mellitus without complications: Secondary | ICD-10-CM | POA: Insufficient documentation

## 2023-12-28 DIAGNOSIS — Z96651 Presence of right artificial knee joint: Secondary | ICD-10-CM | POA: Diagnosis present

## 2023-12-28 DIAGNOSIS — R079 Chest pain, unspecified: Secondary | ICD-10-CM | POA: Diagnosis not present

## 2023-12-28 DIAGNOSIS — Z833 Family history of diabetes mellitus: Secondary | ICD-10-CM | POA: Diagnosis not present

## 2023-12-28 DIAGNOSIS — Z7902 Long term (current) use of antithrombotics/antiplatelets: Secondary | ICD-10-CM | POA: Insufficient documentation

## 2023-12-28 DIAGNOSIS — E1165 Type 2 diabetes mellitus with hyperglycemia: Secondary | ICD-10-CM | POA: Diagnosis present

## 2023-12-28 DIAGNOSIS — Z8701 Personal history of pneumonia (recurrent): Secondary | ICD-10-CM | POA: Diagnosis not present

## 2023-12-28 DIAGNOSIS — Z8261 Family history of arthritis: Secondary | ICD-10-CM | POA: Diagnosis not present

## 2023-12-28 DIAGNOSIS — R531 Weakness: Secondary | ICD-10-CM | POA: Diagnosis not present

## 2023-12-28 DIAGNOSIS — Z8249 Family history of ischemic heart disease and other diseases of the circulatory system: Secondary | ICD-10-CM | POA: Diagnosis not present

## 2023-12-28 DIAGNOSIS — I10 Essential (primary) hypertension: Secondary | ICD-10-CM | POA: Insufficient documentation

## 2023-12-28 DIAGNOSIS — M1812 Unilateral primary osteoarthritis of first carpometacarpal joint, left hand: Secondary | ICD-10-CM | POA: Diagnosis not present

## 2023-12-28 DIAGNOSIS — Z1152 Encounter for screening for COVID-19: Secondary | ICD-10-CM | POA: Diagnosis not present

## 2023-12-28 DIAGNOSIS — M7989 Other specified soft tissue disorders: Secondary | ICD-10-CM | POA: Diagnosis not present

## 2023-12-28 DIAGNOSIS — L03114 Cellulitis of left upper limb: Secondary | ICD-10-CM | POA: Diagnosis present

## 2023-12-28 DIAGNOSIS — Z8379 Family history of other diseases of the digestive system: Secondary | ICD-10-CM | POA: Diagnosis not present

## 2023-12-28 DIAGNOSIS — G8929 Other chronic pain: Secondary | ICD-10-CM | POA: Diagnosis present

## 2023-12-28 DIAGNOSIS — L0390 Cellulitis, unspecified: Secondary | ICD-10-CM | POA: Diagnosis not present

## 2023-12-28 DIAGNOSIS — Z87891 Personal history of nicotine dependence: Secondary | ICD-10-CM | POA: Diagnosis not present

## 2023-12-28 DIAGNOSIS — M19011 Primary osteoarthritis, right shoulder: Secondary | ICD-10-CM | POA: Diagnosis present

## 2023-12-28 DIAGNOSIS — M25511 Pain in right shoulder: Secondary | ICD-10-CM | POA: Diagnosis not present

## 2023-12-28 DIAGNOSIS — M79642 Pain in left hand: Secondary | ICD-10-CM | POA: Diagnosis not present

## 2023-12-28 DIAGNOSIS — Z888 Allergy status to other drugs, medicaments and biological substances status: Secondary | ICD-10-CM | POA: Diagnosis not present

## 2023-12-28 DIAGNOSIS — K5730 Diverticulosis of large intestine without perforation or abscess without bleeding: Secondary | ICD-10-CM | POA: Diagnosis not present

## 2023-12-28 DIAGNOSIS — R5381 Other malaise: Secondary | ICD-10-CM | POA: Diagnosis not present

## 2023-12-28 DIAGNOSIS — R509 Fever, unspecified: Secondary | ICD-10-CM | POA: Diagnosis not present

## 2023-12-28 DIAGNOSIS — M25512 Pain in left shoulder: Secondary | ICD-10-CM | POA: Diagnosis not present

## 2023-12-28 DIAGNOSIS — M79605 Pain in left leg: Secondary | ICD-10-CM | POA: Diagnosis not present

## 2023-12-28 DIAGNOSIS — M1612 Unilateral primary osteoarthritis, left hip: Secondary | ICD-10-CM | POA: Diagnosis present

## 2023-12-28 DIAGNOSIS — E7800 Pure hypercholesterolemia, unspecified: Secondary | ICD-10-CM | POA: Diagnosis present

## 2023-12-28 DIAGNOSIS — M25452 Effusion, left hip: Secondary | ICD-10-CM | POA: Diagnosis not present

## 2023-12-28 DIAGNOSIS — Z885 Allergy status to narcotic agent status: Secondary | ICD-10-CM | POA: Diagnosis not present

## 2023-12-28 LAB — BASIC METABOLIC PANEL WITH GFR
Anion gap: 11 (ref 5–15)
BUN: 18 mg/dL (ref 8–23)
CO2: 26 mmol/L (ref 22–32)
Calcium: 9.2 mg/dL (ref 8.9–10.3)
Chloride: 94 mmol/L — ABNORMAL LOW (ref 98–111)
Creatinine, Ser: 0.69 mg/dL (ref 0.44–1.00)
GFR, Estimated: >60 mL/min (ref >60)
Glucose, Bld: 220 mg/dL — ABNORMAL HIGH (ref 70–99)
Potassium: 4 mmol/L (ref 3.5–5.1)
Sodium: 131 mmol/L — ABNORMAL LOW (ref 135–145)

## 2023-12-28 LAB — CBC WITH DIFFERENTIAL/PLATELET
Abs Immature Granulocytes: 0.02 10*3/uL (ref 0.00–0.07)
Basophils Absolute: 0 10*3/uL (ref 0.0–0.1)
Basophils Relative: 0 %
Eosinophils Absolute: 0 10*3/uL (ref 0.0–0.5)
Eosinophils Relative: 0 %
HCT: 32.2 % — ABNORMAL LOW (ref 36.0–46.0)
Hemoglobin: 10.8 g/dL — ABNORMAL LOW (ref 12.0–15.0)
Immature Granulocytes: 0 %
Lymphocytes Relative: 13 %
Lymphs Abs: 1 10*3/uL (ref 0.7–4.0)
MCH: 30.9 pg (ref 26.0–34.0)
MCHC: 33.5 g/dL (ref 30.0–36.0)
MCV: 92 fL (ref 80.0–100.0)
Monocytes Absolute: 0.8 10*3/uL (ref 0.1–1.0)
Monocytes Relative: 11 %
Neutro Abs: 5.7 10*3/uL (ref 1.7–7.7)
Neutrophils Relative %: 76 %
Platelets: 261 10*3/uL (ref 150–400)
RBC: 3.5 MIL/uL — ABNORMAL LOW (ref 3.87–5.11)
RDW: 12.8 % (ref 11.5–15.5)
WBC: 7.6 10*3/uL (ref 4.0–10.5)
nRBC: 0 % (ref 0.0–0.2)

## 2023-12-28 MED ORDER — CEPHALEXIN 500 MG PO CAPS: 500.0000 mg | ORAL_CAPSULE | Freq: Four times a day (QID) | ORAL | 0 refills | Status: DC

## 2023-12-28 MED ORDER — CEPHALEXIN 250 MG PO CAPS
500.0000 mg | ORAL_CAPSULE | Freq: Once | ORAL | Status: AC
  Administered 2023-12-28: 500 mg via ORAL
  Filled 2023-12-28: qty 2

## 2023-12-28 NOTE — ED Triage Notes (Signed)
 Left hand pain , redness and swelling , also reports bilateral shoulders pain . Hx osteoarthritis , no Hx gout .  No injury or fall .

## 2023-12-28 NOTE — ED Provider Notes (Signed)
 [facility] Provider Note   CSN: [id number] Arrival date & time: 12/28/23  1708     Patient presents with: Hand Pain   Kathleen Oliver is a 78 y.o. female who presents emergency department with a chief complaint of left hand pain, redness and swelling.  Patient also reports bilateral shoulder pain.  Patient does have a history of osteoarthritis, no history of gout.  No injury or fall reported.  Significant past medical history of diabetes, hypertension, hypercholesterolemia, hypertension, osteoarthritis, coronary artery disease.  Patient denies chest pain, shortness of breath    Hand Pain       Prior to Admission medications   Medication Sig Start Date End Date Taking? Authorizing Provider  cephALEXin  (KEFLEX ) 500 MG capsule Take 1 capsule (500 mg total) by mouth 4 (four) times daily for 5 days. 12/28/23 01/02/24 Yes Trenity Pha F, PA-C  ACCU-CHEK AVIVA PLUS test strip  10/12/21   [provider]  amLODipine  (NORVASC ) 2.5 MG tablet Take 3 tablets (7.5 mg total) by mouth daily. 09/06/23   Varanasi, Jayadeep S, MD  ANUCORT-HC 25 MG suppository Place 25 mg rectally daily as needed. 12/05/21   [provider]  Cholecalciferol  (VITAMIN D ) 2000 UNITS CAPS Take 2,000 Units by mouth daily.    [provider]  clopidogrel  (PLAVIX ) 75 MG tablet Take 75 mg by mouth at bedtime.     [provider]  Evolocumab  with Infusor 420 MG/3.5ML SOCT Inject 120-130 mg into the skin every 14 (fourteen) days.    [provider]  gabapentin  (NEURONTIN ) 100 MG capsule Take 100 mg by mouth at bedtime.    [provider]  latanoprost  (XALATAN ) 0.005 % ophthalmic solution Place 1 drop into both eyes at bedtime.  05/15/13   [provider]  Lidocaine  4 % PTCH Apply 1 patch topically daily as needed (pain).    [provider]  loperamide  (IMODIUM ) 1 MG/5ML solution Take 1 mg by mouth as needed for diarrhea  or loose stools.    [provider]  losartan  (COZAAR ) 100 MG tablet Take 100 mg by mouth daily.    [provider]  metFORMIN  (GLUCOPHAGE ) 500 MG tablet Take 500 mg by mouth in the morning and at bedtime. 11/12/21   [provider]  methocarbamol  (ROBAXIN ) 500 MG tablet Take 1 tablet (500 mg total) by mouth 3 (three) times daily as needed. 02/24/18   Kay Kemps, MD  metoprolol  succinate (TOPROL  XL) 25 MG 24 hr tablet Take 0.5 tablets (12.5 mg total) by mouth daily. 12/29/22   Dunn, Dayna N, PA-C  pantoprazole  (PROTONIX ) 20 MG tablet Take 20 mg by mouth 2 (two) times daily.    [provider]  Polyethyl Glycol-Propyl Glycol (SYSTANE) 0.4-0.3 % GEL ophthalmic gel Place 1 application into both eyes at bedtime. Patient taking differently: Place 1 application  into both eyes as needed (dry eyes). 12/16/20   Kozlow, Camellia PARAS, MD  Simethicone  (GAS-X PO) Take 1 capsule by mouth daily as needed (abdominal pain).    [provider]  sodium chloride  (OCEAN) 0.65 % SOLN nasal spray Place 1 spray into both nostrils daily as needed for congestion.     [provider]  traMADol  (ULTRAM ) 50 MG tablet Take 50 mg by mouth every 6 (six) hours as needed (Back pain.).     [provider]    Allergies: Tizanidine, Atorvastatin, Colesevelam hcl, Other, Cinoxacin, Codeine, and Penicillins    Review of  Systems  Musculoskeletal:  Positive for arthralgias.    Updated Vital Signs BP (!) 154/60   Pulse 70   Temp 98.3 F (36.8 C)   Resp 18   Wt 52.2 kg   SpO2 95%   BMI 21.73 kg/m   Physical Exam Vitals and nursing note reviewed.  Constitutional:      General: She is awake. She is not in acute distress.    Appearance: Normal appearance. She is not ill-appearing, toxic-appearing or diaphoretic.  HENT:     Head: Normocephalic and atraumatic.  Eyes:     General: No scleral icterus. Cardiovascular:     Rate and Rhythm: Normal rate and regular rhythm.      Pulses: Normal pulses.  Pulmonary:     Effort: Pulmonary effort is normal. No respiratory distress.     Breath sounds: Normal breath sounds. No wheezing, rhonchi or rales.  Musculoskeletal:        General: Swelling (L hand/wrist swollen) and tenderness (Tenderness of L forearm, L wrist, hand) present.     Right lower leg: No edema.     Left lower leg: No edema.     Comments: ROM of bilateral shoulders reduced. Patient unable to raise arms above chest actively. Passive ROM of shoulders above head intact but painful.   ROM of L wrist intact including flexion and extension Patient able to flex and extend all fingers on L hand   ROM of L elbow intact including flexion and extension  Skin:    General: Skin is warm and dry.     Capillary Refill: Capillary refill takes less than 2 seconds.     Findings: Erythema (Redness present of L wrist/hand) and rash present.  Neurological:     General: No focal deficit present.     Mental Status: She is alert and oriented to person, place, and time.     Sensory: No sensory deficit.     Motor: No weakness.     Coordination: Coordination normal.  Psychiatric:        Mood and Affect: Mood normal.        Behavior: Behavior normal. Behavior is cooperative.     (all labs ordered are listed, but only abnormal results are displayed) Labs Reviewed  CBC WITH DIFFERENTIAL/PLATELET - Abnormal; Notable for the following components:      Result Value   RBC 3.50 (*)    Hemoglobin 10.8 (*)    HCT 32.2 (*)    All other components within normal limits  BASIC METABOLIC PANEL WITH GFR - Abnormal; Notable for the following components:   Sodium 131 (*)    Chloride 94 (*)    Glucose, Bld 220 (*)    All other components within normal limits    EKG: None  Radiology: DG Hand Complete Left Result Date: 12/28/2023 CLINICAL DATA:  Redness and swelling. EXAM: LEFT HAND - COMPLETE 3+ VIEW COMPARISON:  None Available. FINDINGS: There is no evidence of fracture or  dislocation. Osteoarthritis of the second and third metacarpal phalangeal joints as well as thumb carpal metacarpal joint with joint space narrowing and spurring. There is mild osteoarthritis throughout the digits. No erosive or bony destructive change. No focal soft tissue abnormalities. No soft tissue gas or radiopaque foreign body. IMPRESSION: 1. No acute osseous abnormality. 2. Osteoarthritis of the second and third metacarpophalangeal joints as well as thumb carpometacarpal joint. Mild osteoarthritis throughout the digits. Electronically Signed   By: Andrea Gasman M.D.   On: 12/28/2023 19:15  Procedures   Medications Ordered in the ED  cephALEXin  (KEFLEX ) capsule 500 mg (500 mg Oral Given 12/28/23 2021)                                    Medical Decision Making Amount and/or Complexity of Data Reviewed Labs: ordered. Radiology: ordered.  Risk Prescription drug management.   Patient presents to the ED for concern of left hand redness, swelling, bilateral shoulder pain, this involves an extensive number of treatment options, and is a complaint that carries with it a high risk of complications and morbidity.  The differential diagnosis includes osteoarthritis, cellulitis, DVT, trauma/injury, autoimmune disorder, etc.   Co morbidities that complicate the patient evaluation  History of bilateral shoulder osteoarthritis, family history of polymyalgia rheumatica, diabetes, hypertension, hypercholesterolemia, coronary artery disease   Additional history obtained:  Additional history and chart reviewed including office visit on 12/26/2023 with orthopedics.  Patient evaluated for bilateral shoulder pain, left wrist pain, right hip pain.  At this time patient was not given anti-inflammatories due to history of colon perforation and possible gastric cancer that she is being evaluated for.  At this time patient was given a few days of tramadol  to bridge for pain.  Did not prescribe  steroids at this time due to diabetic history.  Lab Tests:  I Ordered, and personally interpreted labs.  The pertinent results include: BC-hemoglobin 10.8, BMP glucose 220   Imaging Studies ordered:  I ordered imaging studies including left complete hand x-ray I independently visualized and interpreted imaging which showed osteoarthritis, no acute bony abnormality I agree with the radiologist interpretation   Medicines ordered and prescription drug management:  I ordered medication including Keflex  for cellulitis Reevaluation of the patient after these medicines showed that the patient stayed the same I have reviewed the patients home medicines and have made adjustments as needed  Spoke with pharmacy who reviewed allergies as well as previous medications patient is taken.  Patient states that allergic reaction to penicillin was over 50 years ago, patient has never had a documented allergic reaction to a cephalosporin medication.  Patient has significant history of C. difficile as well as colon perforation, attempting to avoid clindamycin  therapy.  Based on pharmacy recommendation attempt Keflex  treatment in the emergency department, monitor, and then prescribe outpatient therapy.   Test Considered:  Imaging of bilateral shoulders: Declined at this time as patient states she knows she has a history of arthritis and has previously seen orthopedics, patient denies trauma/injury, no rash or sign of cellulitis present of either shoulder area.   Critical Interventions:  none   Consultations Obtained:  I requested consultation with the ED pharmacist and discussed lab and imaging findings as well as pertinent plan - they recommend: treatment outpatient with keflex  to cover for possible cellulitis    Problem List / ED Course:  78 year old female, bilateral shoulder pain, left wrist pain Patient denies trauma/injury, states that she has a history of osteoarthritis of both shoulders and  they feel similar to how they always do, however patient has redness and swelling present to distal left forearm and dorsum of left hand, flexion and extension of wrist intact, range of motion of all fingers intact, patient able to flex and extend wrist against resistance Left upper extremity neurovascularly intact, radial pulse 2+, sensation intact Due to the possibility of cellulitis, patient placed on oral antibiotic therapy Patient has no elevated white blood  cell count today, low clinical suspicion for sepsis as patient is well-appearing, normal vital signs, afebrile Overall lab work up in the emergency department today reassuring, no white count, however hemoglobin of 10.8, patient instructed to make primary care provider aware of this After consultation with pharmacy, patient given 1 dose of Keflex  here, patient tolerated it well without complication or reaction, patient discharged with recurrent return precautions Patient discharged  Reevaluation:  After the interventions noted above, I reevaluated the patient and found that they have :stayed the same   Social Determinants of Health:  none   Dispostion:  After consideration of the diagnostic results and the patients response to treatment, I feel that the patent would benefit from discharge and outpatient oral antibiotic therapy as prescribed.  If symptoms persist or worsen recommend follow-up with primary care provider within 48 hours.  Patient instructed to follow-up with orthopedics for bilateral shoulder pain that is chronic.     Final diagnoses:  Cellulitis of left hand  Left hand pain  Chronic pain of both shoulders    ED Discharge Orders          Ordered    cephALEXin  (KEFLEX ) 500 MG capsule  4 times daily        12/28/23 2009               Tareva Leske F, PA-C 12/29/23 0021    Neysa Caron PARAS, DO 12/31/23 [phone]

## 2023-12-28 NOTE — Discharge Instructions (Addendum)
 It was a pleasure taking care of you today.  Based on your history, physical exam, labs, and imaging I feel you are safe for discharge.  To cover the possibility of cellulitis you have been prescribed an outpatient antibiotic, please take this medication as prescribed.  Please monitor your symptoms and make sure you do not develop any allergic reaction type symptoms while taking this medication.  If you do please return the emergency department as soon as possible. If you experience any of the following symptoms including but not limited to fever, chills, chest pain, shortness of breath, worsening pain, worsening swelling, increasing redness please return to the ED or seek further medical care.  Please make your primary care provider aware of your hemoglobin level today, and please continue to follow-up with orthopedics for your shoulders, hip, and left wrist. Recommend follow-up with primary care provider in 48 hours for post-discharge visit.

## 2023-12-29 ENCOUNTER — Emergency Department (HOSPITAL_COMMUNITY)

## 2023-12-29 ENCOUNTER — Inpatient Hospital Stay (HOSPITAL_COMMUNITY)
Admission: EM | Admit: 2023-12-29 | Discharge: 2024-01-02 | DRG: 603 | Disposition: A | Discharge status: Home Health | Attending: Family Medicine | Admitting: Family Medicine

## 2023-12-29 ENCOUNTER — Other Ambulatory Visit: Payer: Self-pay

## 2023-12-29 ENCOUNTER — Ambulatory Visit (HOSPITAL_COMMUNITY)

## 2023-12-29 ENCOUNTER — Encounter (HOSPITAL_COMMUNITY): Payer: Self-pay

## 2023-12-29 DIAGNOSIS — M25532 Pain in left wrist: Secondary | ICD-10-CM | POA: Diagnosis present

## 2023-12-29 DIAGNOSIS — Z9842 Cataract extraction status, left eye: Secondary | ICD-10-CM

## 2023-12-29 DIAGNOSIS — R079 Chest pain, unspecified: Secondary | ICD-10-CM | POA: Diagnosis not present

## 2023-12-29 DIAGNOSIS — Z87891 Personal history of nicotine dependence: Secondary | ICD-10-CM

## 2023-12-29 DIAGNOSIS — M1612 Unilateral primary osteoarthritis, left hip: Secondary | ICD-10-CM | POA: Diagnosis present

## 2023-12-29 DIAGNOSIS — M19012 Primary osteoarthritis, left shoulder: Secondary | ICD-10-CM | POA: Diagnosis present

## 2023-12-29 DIAGNOSIS — R531 Weakness: Secondary | ICD-10-CM | POA: Diagnosis not present

## 2023-12-29 DIAGNOSIS — Z888 Allergy status to other drugs, medicaments and biological substances status: Secondary | ICD-10-CM

## 2023-12-29 DIAGNOSIS — M160 Bilateral primary osteoarthritis of hip: Secondary | ICD-10-CM | POA: Diagnosis not present

## 2023-12-29 DIAGNOSIS — Z8379 Family history of other diseases of the digestive system: Secondary | ICD-10-CM

## 2023-12-29 DIAGNOSIS — R600 Localized edema: Secondary | ICD-10-CM | POA: Diagnosis present

## 2023-12-29 DIAGNOSIS — Z7984 Long term (current) use of oral hypoglycemic drugs: Secondary | ICD-10-CM

## 2023-12-29 DIAGNOSIS — Z1152 Encounter for screening for COVID-19: Secondary | ICD-10-CM

## 2023-12-29 DIAGNOSIS — Z8701 Personal history of pneumonia (recurrent): Secondary | ICD-10-CM

## 2023-12-29 DIAGNOSIS — Z885 Allergy status to narcotic agent status: Secondary | ICD-10-CM

## 2023-12-29 DIAGNOSIS — E119 Type 2 diabetes mellitus without complications: Secondary | ICD-10-CM

## 2023-12-29 DIAGNOSIS — Z880 Allergy status to penicillin: Secondary | ICD-10-CM

## 2023-12-29 DIAGNOSIS — E1165 Type 2 diabetes mellitus with hyperglycemia: Secondary | ICD-10-CM | POA: Diagnosis present

## 2023-12-29 DIAGNOSIS — Z7902 Long term (current) use of antithrombotics/antiplatelets: Secondary | ICD-10-CM

## 2023-12-29 DIAGNOSIS — Z961 Presence of intraocular lens: Secondary | ICD-10-CM | POA: Diagnosis present

## 2023-12-29 DIAGNOSIS — M1990 Unspecified osteoarthritis, unspecified site: Secondary | ICD-10-CM | POA: Diagnosis present

## 2023-12-29 DIAGNOSIS — M2550 Pain in unspecified joint: Principal | ICD-10-CM

## 2023-12-29 DIAGNOSIS — I10 Essential (primary) hypertension: Secondary | ICD-10-CM | POA: Diagnosis present

## 2023-12-29 DIAGNOSIS — Z79899 Other long term (current) drug therapy: Secondary | ICD-10-CM

## 2023-12-29 DIAGNOSIS — Z87440 Personal history of urinary (tract) infections: Secondary | ICD-10-CM

## 2023-12-29 DIAGNOSIS — Z90710 Acquired absence of both cervix and uterus: Secondary | ICD-10-CM

## 2023-12-29 DIAGNOSIS — K5730 Diverticulosis of large intestine without perforation or abscess without bleeding: Secondary | ICD-10-CM | POA: Diagnosis not present

## 2023-12-29 DIAGNOSIS — L03114 Cellulitis of left upper limb: Secondary | ICD-10-CM | POA: Diagnosis not present

## 2023-12-29 DIAGNOSIS — M25552 Pain in left hip: Secondary | ICD-10-CM | POA: Diagnosis present

## 2023-12-29 DIAGNOSIS — M19011 Primary osteoarthritis, right shoulder: Secondary | ICD-10-CM | POA: Diagnosis present

## 2023-12-29 DIAGNOSIS — L0390 Cellulitis, unspecified: Secondary | ICD-10-CM | POA: Diagnosis not present

## 2023-12-29 DIAGNOSIS — M25512 Pain in left shoulder: Secondary | ICD-10-CM | POA: Diagnosis not present

## 2023-12-29 DIAGNOSIS — R5381 Other malaise: Secondary | ICD-10-CM | POA: Diagnosis present

## 2023-12-29 DIAGNOSIS — Z9841 Cataract extraction status, right eye: Secondary | ICD-10-CM

## 2023-12-29 DIAGNOSIS — Z8711 Personal history of peptic ulcer disease: Secondary | ICD-10-CM

## 2023-12-29 DIAGNOSIS — Z833 Family history of diabetes mellitus: Secondary | ICD-10-CM

## 2023-12-29 DIAGNOSIS — Z9049 Acquired absence of other specified parts of digestive tract: Secondary | ICD-10-CM

## 2023-12-29 DIAGNOSIS — K219 Gastro-esophageal reflux disease without esophagitis: Secondary | ICD-10-CM | POA: Diagnosis present

## 2023-12-29 DIAGNOSIS — M79605 Pain in left leg: Secondary | ICD-10-CM | POA: Diagnosis not present

## 2023-12-29 DIAGNOSIS — Z96651 Presence of right artificial knee joint: Secondary | ICD-10-CM | POA: Diagnosis present

## 2023-12-29 DIAGNOSIS — M25452 Effusion, left hip: Secondary | ICD-10-CM | POA: Diagnosis not present

## 2023-12-29 DIAGNOSIS — R7982 Elevated C-reactive protein (CRP): Secondary | ICD-10-CM | POA: Diagnosis present

## 2023-12-29 DIAGNOSIS — I2510 Atherosclerotic heart disease of native coronary artery without angina pectoris: Secondary | ICD-10-CM | POA: Diagnosis present

## 2023-12-29 DIAGNOSIS — Z8249 Family history of ischemic heart disease and other diseases of the circulatory system: Secondary | ICD-10-CM

## 2023-12-29 DIAGNOSIS — E7800 Pure hypercholesterolemia, unspecified: Secondary | ICD-10-CM | POA: Diagnosis present

## 2023-12-29 DIAGNOSIS — G8929 Other chronic pain: Secondary | ICD-10-CM | POA: Diagnosis present

## 2023-12-29 DIAGNOSIS — Z8261 Family history of arthritis: Secondary | ICD-10-CM

## 2023-12-29 LAB — URINALYSIS, ROUTINE W REFLEX MICROSCOPIC
Bacteria, UA: NONE SEEN
Bilirubin Urine: NEGATIVE
Glucose, UA: >=500 mg/dL — AB
Hgb urine dipstick: NEGATIVE
Ketones, ur: 5 mg/dL — AB
Leukocytes,Ua: NEGATIVE
Nitrite: NEGATIVE
Protein, ur: 30 mg/dL — AB
Specific Gravity, Urine: 1.021 (ref 1.005–1.030)
pH: 5 (ref 5.0–8.0)

## 2023-12-29 LAB — COMPREHENSIVE METABOLIC PANEL WITH GFR
ALT: 19 U/L (ref 0–44)
AST: 19 U/L (ref 15–41)
Albumin: 2.8 g/dL — ABNORMAL LOW (ref 3.5–5.0)
Alkaline Phosphatase: 54 U/L (ref 38–126)
Anion gap: 10 (ref 5–15)
BUN: 14 mg/dL (ref 8–23)
CO2: 27 mmol/L (ref 22–32)
Calcium: 8.4 mg/dL — ABNORMAL LOW (ref 8.9–10.3)
Chloride: 96 mmol/L — ABNORMAL LOW (ref 98–111)
Creatinine, Ser: 0.64 mg/dL (ref 0.44–1.00)
GFR, Estimated: >60 mL/min (ref >60)
Glucose, Bld: 227 mg/dL — ABNORMAL HIGH (ref 70–99)
Potassium: 3.7 mmol/L (ref 3.5–5.1)
Sodium: 133 mmol/L — ABNORMAL LOW (ref 135–145)
Total Bilirubin: 0.9 mg/dL (ref 0.0–1.2)
Total Protein: 6.8 g/dL (ref 6.5–8.1)

## 2023-12-29 LAB — CBG MONITORING, ED: Glucose-Capillary: 247 mg/dL — ABNORMAL HIGH (ref 70–99)

## 2023-12-29 LAB — CBC WITH DIFFERENTIAL/PLATELET
Abs Immature Granulocytes: 0.02 10*3/uL (ref 0.00–0.07)
Basophils Absolute: 0 10*3/uL (ref 0.0–0.1)
Basophils Relative: 0 %
Eosinophils Absolute: 0 10*3/uL (ref 0.0–0.5)
Eosinophils Relative: 0 %
HCT: 31.3 % — ABNORMAL LOW (ref 36.0–46.0)
Hemoglobin: 10.2 g/dL — ABNORMAL LOW (ref 12.0–15.0)
Immature Granulocytes: 0 %
Lymphocytes Relative: 7 %
Lymphs Abs: 0.6 10*3/uL — ABNORMAL LOW (ref 0.7–4.0)
MCH: 30.4 pg (ref 26.0–34.0)
MCHC: 32.6 g/dL (ref 30.0–36.0)
MCV: 93.4 fL (ref 80.0–100.0)
Monocytes Absolute: 0.9 10*3/uL (ref 0.1–1.0)
Monocytes Relative: 11 %
Neutro Abs: 6.8 10*3/uL (ref 1.7–7.7)
Neutrophils Relative %: 82 %
Platelets: 219 10*3/uL (ref 150–400)
RBC: 3.35 MIL/uL — ABNORMAL LOW (ref 3.87–5.11)
RDW: 12.7 % (ref 11.5–15.5)
WBC: 8.3 10*3/uL (ref 4.0–10.5)
nRBC: 0 % (ref 0.0–0.2)

## 2023-12-29 LAB — I-STAT CG4 LACTIC ACID, ED: Lactic Acid, Venous: 1 mmol/L (ref 0.5–1.9)

## 2023-12-29 LAB — C-REACTIVE PROTEIN: CRP: 12.9 mg/dL — ABNORMAL HIGH (ref <1.0)

## 2023-12-29 LAB — RESP PANEL BY RT-PCR (RSV, FLU A&B, COVID)  RVPGX2
Influenza A by PCR: NEGATIVE
Influenza B by PCR: NEGATIVE
Resp Syncytial Virus by PCR: NEGATIVE
SARS Coronavirus 2 by RT PCR: NEGATIVE

## 2023-12-29 LAB — MAGNESIUM: Magnesium: 1.5 mg/dL — ABNORMAL LOW (ref 1.7–2.4)

## 2023-12-29 LAB — HEMOGLOBIN A1C
Hgb A1c MFr Bld: 7.5 % — ABNORMAL HIGH (ref 4.8–5.6)
Mean Plasma Glucose: 168.55 mg/dL

## 2023-12-29 LAB — GLUCOSE, CAPILLARY
Glucose-Capillary: 326 mg/dL — ABNORMAL HIGH (ref 70–99)
Glucose-Capillary: 129 mg/dL — ABNORMAL HIGH (ref 70–99)

## 2023-12-29 LAB — SEDIMENTATION RATE: Sed Rate: 80 mm/h — ABNORMAL HIGH (ref 0–22)

## 2023-12-29 MED ORDER — INSULIN ASPART 100 UNIT/ML IJ SOLN
0.0000 [IU] | Freq: Every day | INTRAMUSCULAR | Status: DC
  Administered 2023-12-30: 4 [IU] via SUBCUTANEOUS
  Filled 2023-12-29: qty 0.05

## 2023-12-29 MED ORDER — LOSARTAN POTASSIUM 50 MG PO TABS
100.0000 mg | ORAL_TABLET | Freq: Every day | ORAL | Status: DC
  Administered 2023-12-29 – 2024-01-02 (×5): 100 mg via ORAL
  Filled 2023-12-29 (×5): qty 2

## 2023-12-29 MED ORDER — INSULIN ASPART 100 UNIT/ML IJ SOLN
0.0000 [IU] | Freq: Three times a day (TID) | INTRAMUSCULAR | Status: DC
  Administered 2023-12-29: 11 [IU] via SUBCUTANEOUS
  Administered 2023-12-30: 3 [IU] via SUBCUTANEOUS
  Administered 2023-12-30: 5 [IU] via SUBCUTANEOUS
  Administered 2023-12-30: 2 [IU] via SUBCUTANEOUS
  Filled 2023-12-29: qty 0.15

## 2023-12-29 MED ORDER — ONDANSETRON HCL 4 MG PO TABS: 4.0000 mg | ORAL_TABLET | Freq: Four times a day (QID) | ORAL | Status: DC | PRN

## 2023-12-29 MED ORDER — ACETAMINOPHEN 325 MG PO TABS
650.0000 mg | ORAL_TABLET | Freq: Four times a day (QID) | ORAL | Status: DC | PRN
  Administered 2023-12-29 – 2024-01-02 (×6): 650 mg via ORAL
  Filled 2023-12-29 (×6): qty 2

## 2023-12-29 MED ORDER — ENOXAPARIN SODIUM 40 MG/0.4ML IJ SOSY
40.0000 mg | PREFILLED_SYRINGE | INTRAMUSCULAR | Status: DC
  Administered 2023-12-29 – 2024-01-02 (×5): 40 mg via SUBCUTANEOUS
  Filled 2023-12-29 (×5): qty 0.4

## 2023-12-29 MED ORDER — MAGNESIUM SULFATE 2 GM/50ML IV SOLN
2.0000 g | Freq: Once | INTRAVENOUS | Status: AC
  Administered 2023-12-29: 2 g via INTRAVENOUS
  Filled 2023-12-29: qty 50

## 2023-12-29 MED ORDER — PANTOPRAZOLE SODIUM 20 MG PO TBEC
20.0000 mg | DELAYED_RELEASE_TABLET | Freq: Two times a day (BID) | ORAL | Status: DC
  Administered 2023-12-29 – 2024-01-02 (×8): 20 mg via ORAL
  Filled 2023-12-29 (×9): qty 1

## 2023-12-29 MED ORDER — ONDANSETRON HCL 4 MG/2ML IJ SOLN: 4.0000 mg | Freq: Four times a day (QID) | INTRAMUSCULAR | Status: DC | PRN

## 2023-12-29 MED ORDER — ACETAMINOPHEN 650 MG RE SUPP: 650.0000 mg | Freq: Four times a day (QID) | RECTAL | Status: DC | PRN

## 2023-12-29 MED ORDER — GABAPENTIN 100 MG PO CAPS
100.0000 mg | ORAL_CAPSULE | Freq: Every day | ORAL | Status: DC
  Administered 2023-12-29 – 2024-01-01 (×4): 100 mg via ORAL
  Filled 2023-12-29 (×4): qty 1

## 2023-12-29 MED ORDER — TRAMADOL HCL 50 MG PO TABS
50.0000 mg | ORAL_TABLET | Freq: Once | ORAL | Status: AC
  Administered 2023-12-29: 50 mg via ORAL
  Filled 2023-12-29: qty 1

## 2023-12-29 MED ORDER — TRAMADOL HCL 50 MG PO TABS
50.0000 mg | ORAL_TABLET | Freq: Four times a day (QID) | ORAL | Status: DC | PRN
  Administered 2023-12-29 – 2023-12-30 (×4): 50 mg via ORAL
  Filled 2023-12-29 (×4): qty 1

## 2023-12-29 MED ORDER — AMLODIPINE BESYLATE 5 MG PO TABS
7.5000 mg | ORAL_TABLET | Freq: Every day | ORAL | Status: DC
  Administered 2023-12-29 – 2023-12-30 (×2): 7.5 mg via ORAL
  Filled 2023-12-29 (×2): qty 2

## 2023-12-29 MED ORDER — TRAZODONE HCL 50 MG PO TABS: 25.0000 mg | ORAL_TABLET | Freq: Every evening | ORAL | Status: DC | PRN

## 2023-12-29 MED ORDER — ALBUTEROL SULFATE (2.5 MG/3ML) 0.083% IN NEBU: 2.5000 mg | INHALATION_SOLUTION | RESPIRATORY_TRACT | Status: DC | PRN

## 2023-12-29 MED ORDER — CEPHALEXIN 500 MG PO CAPS
500.0000 mg | ORAL_CAPSULE | Freq: Four times a day (QID) | ORAL | Status: DC
  Administered 2023-12-29 – 2024-01-02 (×17): 500 mg via ORAL
  Filled 2023-12-29 (×17): qty 1

## 2023-12-29 MED ORDER — SODIUM CHLORIDE 0.9 % IV SOLN
2.0000 g | Freq: Once | INTRAVENOUS | Status: AC
  Administered 2023-12-29: 2 g via INTRAVENOUS
  Filled 2023-12-29: qty 20

## 2023-12-29 NOTE — H&P (Signed)
 History and Physical  Kathleen Oliver FMW:[id number] DOB: 01-Oct-1945 DOA: 12/29/2023  PCP: Dwight Trula SQUIBB, MD   Chief Complaint: Left hip pain  HPI: Kathleen Oliver is a 78 y.o. female with medical history significant for hypertension, hyperlipidemia, non-insulin -dependent diabetes, osteoarthritis being admitted to the hospital with left hip pain.  Patient states that she lives on her own, has been in her usual state of health until a couple days ago when she noticed swelling and erythema on the dorsum of her left hand.  Says that she often hits the back of her hand on door frames, etc. and sometimes breaks the skin.  In any case she was seen in the ER yesterday was prescribed Keflex and today the erythema and swelling is significantly improved.  She denies any fevers or chills, however this morning when she tried to get up she had pretty severe left hip pain, in the anterior part of the left hip.  It does not hurt with passive range of motion, but she has a hard time standing up as it feels like her leg is going to give out, and she has muscular pain.  Workup in the emergency department as noted below was negative for evidence of septic joint or any other acute findings such as fracture.  Due to the patient's pain and inability to ambulate, hospitalist observation admission was requested.  Review of Systems: Please see HPI for pertinent positives and negatives. A complete 10 system review of systems are otherwise negative.  Past Medical History:  Diagnosis Date   Aortic insufficiency    Bronchial asthma    Chronic bronchitis (HCC)    numerous years; not q yr    Complication of anesthesia 1999   had 3 seizures & ended up in ICU days after I'd had anesthesia    Diverticulosis of colon    Dysrhythmia    PVC's   GERD (gastroesophageal reflux disease)    History of hiatal hernia    HTN (hypertension)    Hypercholesteremia    Hypertension    LVH (left ventricular hypertrophy)    Obesity     Osteoarthrosis    Pneumonia 2000's X 1   Premature atrial contraction    PVC's (premature ventricular contractions)    Recurrent UTI    Seizures (HCC) 1999 X 3   days after anesthesia- no further issues   Stroke (HCC)    /MRI from 1999;; showed I'd had had several small ones-no residual effects.   Type II diabetes mellitus (HCC)    Past Surgical History:  Procedure Laterality Date   ABDOMINAL HERNIA REPAIR  2001 X 2   APPENDECTOMY  ~ 2000   BIOPSY THYROID      BLADDER SUSPENSION  2000's   CARDIAC CATHETERIZATION  1990's? ; 11/2009   CARPAL TUNNEL RELEASE Bilateral ~ 2003   CATARACT EXTRACTION W/ INTRAOCULAR LENS  IMPLANT, BILATERAL Bilateral ~ 2012   CHOLECYSTECTOMY OPEN  1984   COLECTOMY  ~ 2000   perforated colon due to diverticulitis   COLONOSCOPY  04/22/2021   COLONOSCOPY WITH PROPOFOL  N/A 02/18/2015   Procedure: COLONOSCOPY WITH PROPOFOL ;  Surgeon: Gladis MARLA Louder, MD;  Location: [facility];  Service: Endoscopy;  Laterality: N/A;   COLOSTOMY  ~ 2000   COLOSTOMY REVERSAL  ~ 2000   only needed it 9 months   ESOPHAGEAL DILATION  2000's X 1   HERNIA REPAIR     SHOULDER ARTHROSCOPY W/ ROTATOR CUFF REPAIR Right    x2 -scope '07  TONSILLECTOMY AND ADENOIDECTOMY  1950's   TOTAL ABDOMINAL HYSTERECTOMY  2004   TOTAL KNEE ARTHROPLASTY Right 02/24/2018   TOTAL KNEE ARTHROPLASTY Right 02/24/2018   Procedure: RIGHT TOTAL KNEE ARTHROPLASTY;  Surgeon: Kay Kemps, MD;  [facility];  Service: Orthopedics;  Laterality: Right;   TRIGGER FINGER RELEASE Right 10/2009   ring finger   TRIGGER FINGER RELEASE Right 04/2014   index & pinky   UPPER GI ENDOSCOPY  04/22/2021   Social History:  reports that she quit smoking about 41 years ago. Her smoking use included cigarettes. She started smoking about 61 years ago. She has a 40 pack-year smoking history. She has never used smokeless tobacco. She reports current alcohol use. She reports that she does not use  drugs.  Allergies  Allergen Reactions   Tizanidine Other (See Comments)    Heart flutter   Atorvastatin Other (See Comments)   Colesevelam Hcl Other (See Comments)   Other Other (See Comments)   Cinoxacin Other (See Comments)    hives   Codeine Itching   Penicillins Hives    Has patient had a PCN reaction causing immediate rash, facial/tongue/throat swelling, SOB or lightheadedness with hypotension: Yes Has patient had a PCN reaction causing severe rash involving mucus membranes or skin necrosis: Yes Has patient had a PCN reaction that required hospitalization No Has patient had a PCN reaction occurring within the last 10 years: No If all of the above answers are NO, then may proceed with Cephalosporin use.      Family History  Problem Relation Age of Onset   Diverticulitis Mother    Hypertension Mother    Heart attack Father    Hypertension Sister    Heart attack Brother    Diabetes Brother    Arthritis Daughter      Prior to Admission medications   Medication Sig Start Date End Date Taking? Authorizing Provider  ACCU-CHEK AVIVA PLUS test strip  10/12/21   [provider]  amLODipine  (NORVASC ) 2.5 MG tablet Take 3 tablets (7.5 mg total) by mouth daily. 09/06/23   Varanasi, Jayadeep S, MD  ANUCORT-HC 25 MG suppository Place 25 mg rectally daily as needed. 12/05/21   [provider]  cephALEXin (KEFLEX) 500 MG capsule Take 1 capsule (500 mg total) by mouth 4 (four) times daily for 5 days. 12/28/23 01/02/24  Hinnant, Collin F, PA-C  Cholecalciferol  (VITAMIN D ) 2000 UNITS CAPS Take 2,000 Units by mouth daily.    [provider]  clopidogrel  (PLAVIX ) 75 MG tablet Take 75 mg by mouth at bedtime.     [provider]  Evolocumab  with Infusor 420 MG/3.5ML SOCT Inject 120-130 mg into the skin every 14 (fourteen) days.    [provider]  gabapentin  (NEURONTIN ) 100 MG capsule Take 100 mg by mouth at bedtime.    [provider]   latanoprost  (XALATAN ) 0.005 % ophthalmic solution Place 1 drop into both eyes at bedtime.  05/15/13   [provider]  Lidocaine  4 % PTCH Apply 1 patch topically daily as needed (pain).    [provider]  loperamide  (IMODIUM ) 1 MG/5ML solution Take 1 mg by mouth as needed for diarrhea or loose stools.    [provider]  losartan  (COZAAR ) 100 MG tablet Take 100 mg by mouth daily.    [provider]  metFORMIN  (GLUCOPHAGE ) 500 MG tablet Take 500 mg by mouth in the morning and at bedtime. 11/12/21   [provider]  methocarbamol  (ROBAXIN ) 500 MG  tablet Take 1 tablet (500 mg total) by mouth 3 (three) times daily as needed. 02/24/18   Kay Kemps, MD  metoprolol  succinate (TOPROL  XL) 25 MG 24 hr tablet Take 0.5 tablets (12.5 mg total) by mouth daily. 12/29/22   Dunn, Dayna N, PA-C  pantoprazole  (PROTONIX ) 20 MG tablet Take 20 mg by mouth 2 (two) times daily.    [provider]  Polyethyl Glycol-Propyl Glycol (SYSTANE) 0.4-0.3 % GEL ophthalmic gel Place 1 application into both eyes at bedtime. Patient taking differently: Place 1 application  into both eyes as needed (dry eyes). 12/16/20   Kozlow, Camellia PARAS, MD  Simethicone  (GAS-X PO) Take 1 capsule by mouth daily as needed (abdominal pain).    [provider]  sodium chloride  (OCEAN) 0.65 % SOLN nasal spray Place 1 spray into both nostrils daily as needed for congestion.     [provider]  traMADol  (ULTRAM ) 50 MG tablet Take 50 mg by mouth every 6 (six) hours as needed (Back pain.).     [provider]    Physical Exam: BP (!) 154/72   Pulse (!) 59   Temp 99.2 F (37.3 C) (Oral)   Resp 16   Ht 5' 1 (1.549 m)   Wt 52.2 kg   SpO2 94%   BMI 21.73 kg/m  General:  Alert, oriented, calm, in no acute distress  Eyes: EOMI, clear conjuctivae, white sclerea Neck: supple, no masses, trachea mildline  Cardiovascular: RRR, no murmurs or rubs, no peripheral edema   Respiratory: clear to auscultation bilaterally, no wheezes, no crackles  Abdomen: soft, nontender, nondistended, normal bowel tones heard  Skin: dry, no rashes, dorsum of the left hand without significant swelling has some very mild erythema Musculoskeletal: no joint effusions, normal range of passive motion of the lower extremities Psychiatric: appropriate affect, normal speech  Neurologic: extraocular muscles intact, clear speech, moving all extremities with intact sensorium         Labs on Admission:  Basic Metabolic Panel: Recent Labs  Lab 12/28/23 1815 12/29/23 0811  NA 131* 133*  K 4.0 3.7  CL 94* 96*  CO2 26 27  GLUCOSE 220* 227*  BUN 18 14  CREATININE 0.69 0.64  CALCIUM  9.2 8.4*  MG  --  1.5*   Liver Function Tests: Recent Labs  Lab 12/29/23 0811  AST 19  ALT 19  ALKPHOS 54  BILITOT 0.9  PROT 6.8  ALBUMIN 2.8*   No results for input(s): LIPASE, AMYLASE in the last 168 hours. No results for input(s): AMMONIA in the last 168 hours. CBC: Recent Labs  Lab 12/28/23 1815 12/29/23 0811  WBC 7.6 8.3  NEUTROABS 5.7 6.8  HGB 10.8* 10.2*  HCT 32.2* 31.3*  MCV 92.0 93.4  PLT 261 219   Cardiac Enzymes: No results for input(s): CKTOTAL, CKMB, CKMBINDEX, TROPONINI in the last 168 hours. BNP (last 3 results) No results for input(s): BNP in the last 8760 hours.  ProBNP (last 3 results) No results for input(s): PROBNP in the last 8760 hours.  CBG: Recent Labs  Lab 12/29/23 0731  GLUCAP 247*    Radiological Exams on Admission: CT Hip Left Wo Contrast Result Date: 12/29/2023 CLINICAL DATA:  Left hip pain. EXAM: CT OF THE LEFT HIP WITHOUT CONTRAST TECHNIQUE: Multidetector CT imaging of the left hip was performed according to the standard protocol. Multiplanar CT image reconstructions were also generated. RADIATION DOSE REDUCTION: This exam was performed according to the departmental dose-optimization program which includes automated exposure  control, adjustment of the mA and/or kV according to patient size and/or use of iterative reconstruction technique. COMPARISON:  Similar radiographs of the left hip dated 12/29/2023. FINDINGS: Bones/Joint/Cartilage No acute fracture or dislocation. Mild joint space narrowing of the left hip with moderate superolateral acetabular osteophytosis. Small left hip joint effusion. The left sacroiliac joint and pubic symphysis are intact with mild degenerative changes. Mild degenerative disc height loss and endplate osteophytosis at L5-S1. Ligaments Ligaments are suboptimally evaluated by CT. Muscles and Tendons Muscles are normal. No muscle atrophy. No intramuscular fluid collection or hematoma. Soft tissue No fluid collection or hematoma. Partially visualized ventral abdominal wall hernia containing mildly distended fluid and gas-filled loops of small bowel and colon. Sigmoid colonic diverticulosis. IMPRESSION: 1. No acute osseous abnormality. 2. Mild-to-moderate osteoarthritis of the left hip. 3. Small left hip joint effusion. 4. Partially visualized ventral abdominal wall hernia containing mildly distended fluid and gas-filled loops of small bowel and colon. Electronically Signed   By: Harrietta Sherry M.D.   On: 12/29/2023 12:44   DG Chest Portable 1 View Result Date: 12/29/2023 CLINICAL DATA:  Evaluate for pneumonia. Left hand cellulitis. Pain. Weakness. EXAM: PORTABLE CHEST 1 VIEW COMPARISON:  CT chest 10/05/2021 FINDINGS: Cardiac silhouette and mediastinal contours are within normal limits. Mild upper lung linear likely scarring. No acute airspace opacity. No pleural effusion or pneumothorax. Moderate multilevel degenerative disc changes of the thoracic spine. Cholecystectomy clips. IMPRESSION: No active disease. Electronically Signed   By: Tanda Lyons M.D.   On: 12/29/2023 09:05   DG Hip Unilat W or Wo Pelvis 2-3 Views Left Result Date: 12/29/2023 CLINICAL DATA:  Sudden onset of bilateral leg pain today.  Cannot lift legs Foley. Cannot stand by self. EXAM: DG HIP (WITH OR WITHOUT PELVIS) 2-3V LEFT COMPARISON:  CT abdomen pelvis 02/07/2023 FINDINGS: Mild bilateral femoroacetabular joint space narrowing. Moderate bilateral superolateral acetabular degenerative osteophytes. Mild pubic symphysis joint space narrowing and peripheral osteophytosis. Mild lateral recess inferior sacroiliac subchondral sclerosis. No acute fracture is seen, with attention to the left hip. Hernia mesh coils are noted overlying the pelvis. IMPRESSION: Mild-to-moderate bilateral femoroacetabular osteoarthritis. Electronically Signed   By: Tanda Lyons M.D.   On: 12/29/2023 09:03   DG Shoulder Left Result Date: 12/29/2023 CLINICAL DATA:  Left shoulder pain. Left hand cellulitis. Hyperglycemia. EXAM: LEFT SHOULDER - 2+ VIEW COMPARISON:  None Available. FINDINGS: There is diffuse decreased bone mineralization. Mild to moderate glenohumeral joint space narrowing and peripheral osteophytosis. Mild acromioclavicular joint space narrowing and peripheral osteophytosis. No acute fracture is seen. No dislocation. IMPRESSION: Mild-to-moderate glenohumeral and mild acromioclavicular osteoarthritis. Electronically Signed   By: Tanda Lyons M.D.   On: 12/29/2023 09:00   DG Hand Complete Left Result Date: 12/28/2023 CLINICAL DATA:  Redness and swelling. EXAM: LEFT HAND - COMPLETE 3+ VIEW COMPARISON:  None Available. FINDINGS: There is no evidence of fracture or dislocation. Osteoarthritis of the second and third metacarpal phalangeal joints as well as thumb carpal metacarpal joint with joint space narrowing and spurring. There is mild osteoarthritis throughout the digits. No erosive or bony destructive change. No focal soft tissue abnormalities. No soft tissue gas or radiopaque foreign body. IMPRESSION: 1. No acute osseous abnormality. 2. Osteoarthritis of the second and third metacarpophalangeal joints as well as thumb carpometacarpal joint. Mild  osteoarthritis throughout the digits. Electronically Signed   By: Andrea Gasman M.D.   On: 12/28/2023 19:15   Assessment/Plan  Cristan ERRICKA FALKNER is a 78 y.o. female with medical history significant for  hypertension, hyperlipidemia, non-insulin -dependent diabetes, osteoarthritis being admitted to the hospital with left hip pain.  Left hip osteoarthritis-with ambulatory dysfunction, no evidence of fracture, dislocation, septic joint or effusion. -Observation admission -Pain control as needed -PT/OT consult  Left hand cellulitis-per patient this seems to be improving rapidly -Continue p.o. Keflex  Type 2 diabetes-hold metformin , carb modified diet with sliding scale insulin  in the meantime  Hypertension-continue home amlodipine  and losartan   DVT prophylaxis: Lovenox     Code Status: Full Code  Consults called: None  Admission status: Observation  Time spent: 48 minutes  Whitni Pasquini CHRISTELLA Gail MD [facility] Pager [phone]  If 7PM-7AM, please contact night-coverage [email or web address] Password [email or web address]  12/29/2023, 1:52 PM

## 2023-12-29 NOTE — ED Triage Notes (Signed)
 Pt came form home. Yesterday seen at [facility] for cellulitis on left hand. BS high 233. A&O x4. Today sudden onset of bilateral leg pain, cannot lift it up fully. Cannot stand up by herself. No falls.

## 2023-12-29 NOTE — ED Provider Notes (Signed)
 [place] EMERGENCY DEPARTMENT AT [facility] Provider Note   CSN: [id number] Arrival date & time: 12/29/23  9276     Patient presents with: Leg Pain   Kathleen Oliver is a 78 y.o. female.   78 year old female presents today for concern of left hip pain, left shoulder pain with limited range of motion in both of these joints, and difficulty ambulating.  This is a new issue for her.  She typically lives alone in has no issues getting around.  She states her sister and daughter had to get her dressed this morning.  She was seen yesterday at [facility] and was diagnosed with cellulitis and started on Keflex  for her left hand.  She states her hand is visibly improved from the swelling standpoint as well as redness.  She states her hip pain started about 1 week ago with her right hip.  This occurred after she went shopping with her daughter.  After that she was seen by her orthopedist.  She notified her orthopedist after she started developing fevers and was recommended to come into the emergency department.  She was evaluated for this yesterday.  She did run a low-grade fever last night.  She denies dysuria, cough, shortness of breath.  She was also seen by her PCP office within the past week.  Had some blood work including inflammatory markers drawn but these have not resulted.  She states she was tested for COVID as well.  The history is provided by the patient. No language interpreter was used.       Prior to Admission medications   Medication Sig Start Date End Date Taking? Authorizing Provider  ACCU-CHEK AVIVA PLUS test strip  10/12/21   [provider]  amLODipine  (NORVASC ) 2.5 MG tablet Take 3 tablets (7.5 mg total) by mouth daily. 09/06/23   Varanasi, Jayadeep S, MD  ANUCORT-HC 25 MG suppository Place 25 mg rectally daily as needed. 12/05/21   [provider]  cephALEXin  (KEFLEX ) 500 MG capsule Take 1 capsule (500 mg total) by mouth 4 (four) times daily  for 5 days. 12/28/23 01/02/24  Hinnant, Collin F, PA-C  Cholecalciferol  (VITAMIN D ) 2000 UNITS CAPS Take 2,000 Units by mouth daily.    [provider]  clopidogrel  (PLAVIX ) 75 MG tablet Take 75 mg by mouth at bedtime.     [provider]  Evolocumab  with Infusor 420 MG/3.5ML SOCT Inject 120-130 mg into the skin every 14 (fourteen) days.    [provider]  gabapentin  (NEURONTIN ) 100 MG capsule Take 100 mg by mouth at bedtime.    [provider]  latanoprost  (XALATAN ) 0.005 % ophthalmic solution Place 1 drop into both eyes at bedtime.  05/15/13   [provider]  Lidocaine  4 % PTCH Apply 1 patch topically daily as needed (pain).    [provider]  loperamide  (IMODIUM ) 1 MG/5ML solution Take 1 mg by mouth as needed for diarrhea or loose stools.    [provider]  losartan  (COZAAR ) 100 MG tablet Take 100 mg by mouth daily.    [provider]  metFORMIN  (GLUCOPHAGE ) 500 MG tablet Take 500 mg by mouth in the morning and at bedtime. 11/12/21   [provider]  methocarbamol  (ROBAXIN ) 500 MG tablet Take 1 tablet (500 mg total) by mouth 3 (three) times daily as needed. 02/24/18   Kay Kemps, MD  metoprolol  succinate (TOPROL  XL) 25 MG 24 hr tablet Take 0.5 tablets (12.5 mg total) by  mouth daily. 12/29/22   Dunn, Dayna N, PA-C  pantoprazole  (PROTONIX ) 20 MG tablet Take 20 mg by mouth 2 (two) times daily.    [provider]  Polyethyl Glycol-Propyl Glycol (SYSTANE) 0.4-0.3 % GEL ophthalmic gel Place 1 application into both eyes at bedtime. Patient taking differently: Place 1 application  into both eyes as needed (dry eyes). 12/16/20   Kozlow, Camellia PARAS, MD  Simethicone  (GAS-X PO) Take 1 capsule by mouth daily as needed (abdominal pain).    [provider]  sodium chloride  (OCEAN) 0.65 % SOLN nasal spray Place 1 spray into both nostrils daily as needed for congestion.     [provider]  traMADol  (ULTRAM ) 50  MG tablet Take 50 mg by mouth every 6 (six) hours as needed (Back pain.).     [provider]    Allergies: Tizanidine, Atorvastatin, Colesevelam hcl, Other, Cinoxacin, Codeine, and Penicillins    Review of Systems  Constitutional:  Positive for fever. Negative for chills.  Respiratory:  Negative for cough and shortness of breath.   Cardiovascular:  Negative for chest pain.  Neurological:  Negative for light-headedness.  All other systems reviewed and are negative.   Updated Vital Signs BP (!) 142/77 (BP Location: Right Arm)   Pulse 69   Temp 98.7 F (37.1 C) (Oral)   Resp 18   Ht 5' 1 (1.549 m)   Wt 52.2 kg   SpO2 95%   BMI 21.73 kg/m   Physical Exam Vitals and nursing note reviewed.  Constitutional:      General: She is not in acute distress.    Appearance: Normal appearance. She is not ill-appearing.  HENT:     Head: Normocephalic and atraumatic.     Nose: Nose normal.  Eyes:     Conjunctiva/sclera: Conjunctivae normal.  Cardiovascular:     Rate and Rhythm: Normal rate and regular rhythm.  Pulmonary:     Effort: Pulmonary effort is normal. No respiratory distress.  Abdominal:     General: There is no distension.     Palpations: Abdomen is soft.     Tenderness: There is no abdominal tenderness. There is no guarding.  Musculoskeletal:        General: No deformity.     Cervical back: Normal range of motion.     Comments: Left shoulder without tenderness palpation but does have limited range of motion.  There is some tenderness with passive range of motion particularly with passive flexion.  Left hip without tenderness to palpation however some pain with flexion and abduction.  No pain with flexion alone.  Neurovascularly intact in bilateral lower and bilateral upper extremities.  Right hip with good range of motion.  Right knee with good range of motion.  All other major joints without tenderness palpation.  Left wrist without significant redness or swelling.   Skin:    Findings: No rash.  Neurological:     Mental Status: She is alert.     (all labs ordered are listed, but only abnormal results are displayed) Labs Reviewed  CBC WITH DIFFERENTIAL/PLATELET - Abnormal; Notable for the following components:      Result Value   RBC 3.35 (*)    Hemoglobin 10.2 (*)    HCT 31.3 (*)    Lymphs Abs 0.6 (*)    All other components within normal limits  CBG MONITORING, ED - Abnormal; Notable for the following components:   Glucose-Capillary 247 (*)    All other components within normal limits  CULTURE, BLOOD (ROUTINE X 2)  CULTURE, BLOOD (ROUTINE X 2)  RESP PANEL BY RT-PCR (RSV, FLU A&B, COVID)  RVPGX2  SEDIMENTATION RATE  C-REACTIVE PROTEIN  COMPREHENSIVE METABOLIC PANEL WITH GFR  MAGNESIUM   URINALYSIS, ROUTINE W REFLEX MICROSCOPIC  I-STAT CG4 LACTIC ACID, ED    EKG: None  Radiology: DG Chest Portable 1 View Result Date: 12/29/2023 CLINICAL DATA:  Evaluate for pneumonia. Left hand cellulitis. Pain. Weakness. EXAM: PORTABLE CHEST 1 VIEW COMPARISON:  CT chest 10/05/2021 FINDINGS: Cardiac silhouette and mediastinal contours are within normal limits. Mild upper lung linear likely scarring. No acute airspace opacity. No pleural effusion or pneumothorax. Moderate multilevel degenerative disc changes of the thoracic spine. Cholecystectomy clips. IMPRESSION: No active disease. Electronically Signed   By: Tanda Lyons M.D.   On: 12/29/2023 09:05   DG Hip Unilat W or Wo Pelvis 2-3 Views Left Result Date: 12/29/2023 CLINICAL DATA:  Sudden onset of bilateral leg pain today. Cannot lift legs Foley. Cannot stand by self. EXAM: DG HIP (WITH OR WITHOUT PELVIS) 2-3V LEFT COMPARISON:  CT abdomen pelvis 02/07/2023 FINDINGS: Mild bilateral femoroacetabular joint space narrowing. Moderate bilateral superolateral acetabular degenerative osteophytes. Mild pubic symphysis joint space narrowing and peripheral osteophytosis. Mild lateral recess inferior sacroiliac  subchondral sclerosis. No acute fracture is seen, with attention to the left hip. Hernia mesh coils are noted overlying the pelvis. IMPRESSION: Mild-to-moderate bilateral femoroacetabular osteoarthritis. Electronically Signed   By: Tanda Lyons M.D.   On: 12/29/2023 09:03   DG Shoulder Left Result Date: 12/29/2023 CLINICAL DATA:  Left shoulder pain. Left hand cellulitis. Hyperglycemia. EXAM: LEFT SHOULDER - 2+ VIEW COMPARISON:  None Available. FINDINGS: There is diffuse decreased bone mineralization. Mild to moderate glenohumeral joint space narrowing and peripheral osteophytosis. Mild acromioclavicular joint space narrowing and peripheral osteophytosis. No acute fracture is seen. No dislocation. IMPRESSION: Mild-to-moderate glenohumeral and mild acromioclavicular osteoarthritis. Electronically Signed   By: Tanda Lyons M.D.   On: 12/29/2023 09:00   DG Hand Complete Left Result Date: 12/28/2023 CLINICAL DATA:  Redness and swelling. EXAM: LEFT HAND - COMPLETE 3+ VIEW COMPARISON:  None Available. FINDINGS: There is no evidence of fracture or dislocation. Osteoarthritis of the second and third metacarpal phalangeal joints as well as thumb carpal metacarpal joint with joint space narrowing and spurring. There is mild osteoarthritis throughout the digits. No erosive or bony destructive change. No focal soft tissue abnormalities. No soft tissue gas or radiopaque foreign body. IMPRESSION: 1. No acute osseous abnormality. 2. Osteoarthritis of the second and third metacarpophalangeal joints as well as thumb carpometacarpal joint. Mild osteoarthritis throughout the digits. Electronically Signed   By: Andrea Gasman M.D.   On: 12/28/2023 19:15     Procedures   Medications Ordered in the ED - No data to display  Clinical Course as of 12/29/23 1343  Thu Dec 29, 2023  1330 CT without acute process.  Will discuss with hospitalist for admission. [AA]    Clinical Course User Index [AA] Hildegard Loge, PA-C                                  Medical Decision Making Amount and/or Complexity of Data Reviewed Labs: ordered. Radiology: ordered.  Risk Prescription drug management.   Medical Decision Making / ED Course   This patient presents to the ED for concern of weakness, joint pain, this involves an extensive number of treatment options, and is a complaint that carries with  it a high risk of complications and morbidity.  The differential diagnosis includes arthritis, septic joint, cellulitis, other bacterial infection  MDM: 78 year old female presents today for concern of left hip pain, left shoulder pain.  She has had some fevers at home as well.  Diagnosed with cellulitis at [facility] emergency department. Will obtain broad workup looking for any obvious additional source of infection. No suspicion for septic joint given no joint tenderness over the joint and has good passive range of motion.  There is reproduction of pain with flexion and abduction but she is able to flex with passive range of motion without pain. CBC is without leukocytosis, hemoglobin at 10.2 which is similar to recent.  CMP with preserved renal function, glucose of 227.  Magnesium  1.5.  IV repletion provided.  Sed rate, CRP elevated.  X-ray of the left shoulder, left hip show arthritis otherwise no acute process.  CT was obtained of the left hip which shows no acute concern.  There is a hernia but no evidence of strangulation or incarceration.  Lactic acid normal.  UA without evidence of UTI.  Respiratory panel negative.  Chest x-ray without acute cardiopulmonary process.  Will require admission for the weakness.  Will continue to treat cellulitis.  I did see the picture that patient's daughter had on her phone which showed swelling, redness of the left hand.  The redness extended to the distal forearm.  According to daughter this has significantly improved.  I do not currently see any significant swelling or  erythema.  Discussed with hospitalist who will evaluate patient for admission.   Additional history obtained: -Additional history obtained from daughter -External records from outside source obtained and reviewed including: Chart review including previous notes, labs, imaging, consultation notes   Lab Tests: -I ordered, reviewed, and interpreted labs.   The pertinent results include:   Labs Reviewed  SEDIMENTATION RATE - Abnormal; Notable for the following components:      Result Value   Sed Rate 80 (*)    All other components within normal limits  C-REACTIVE PROTEIN - Abnormal; Notable for the following components:   CRP 12.9 (*)    All other components within normal limits  CBC WITH DIFFERENTIAL/PLATELET - Abnormal; Notable for the following components:   RBC 3.35 (*)    Hemoglobin 10.2 (*)    HCT 31.3 (*)    Lymphs Abs 0.6 (*)    All other components within normal limits  COMPREHENSIVE METABOLIC PANEL WITH GFR - Abnormal; Notable for the following components:   Sodium 133 (*)    Chloride 96 (*)    Glucose, Bld 227 (*)    Calcium  8.4 (*)    Albumin 2.8 (*)    All other components within normal limits  MAGNESIUM  - Abnormal; Notable for the following components:   Magnesium  1.5 (*)    All other components within normal limits  URINALYSIS, ROUTINE W REFLEX MICROSCOPIC - Abnormal; Notable for the following components:   Glucose, UA >=500 (*)    Ketones, ur 5 (*)    Protein, ur 30 (*)    All other components within normal limits  CBG MONITORING, ED - Abnormal; Notable for the following components:   Glucose-Capillary 247 (*)    All other components within normal limits  RESP PANEL BY RT-PCR (RSV, FLU A&B, COVID)  RVPGX2  CULTURE, BLOOD (ROUTINE X 2)  CULTURE, BLOOD (ROUTINE X 2)  I-STAT CG4 LACTIC ACID, ED      EKG  EKG Interpretation Date/Time:    Ventricular Rate:    PR Interval:    QRS Duration:    QT Interval:    QTC Calculation:   R Axis:      Text  Interpretation:           Imaging Studies ordered: I ordered imaging studies including chest x-ray, left shoulder x-ray, left hip x-ray, left CT I independently visualized and interpreted imaging. I agree with the radiologist interpretation   Medicines ordered and prescription drug management: Meds ordered this encounter  Medications   magnesium  sulfate IVPB 2 g 50 mL   cefTRIAXone  (ROCEPHIN ) 2 g in sodium chloride  0.9 % 100 mL IVPB    Antibiotic Indication::   Cellulitis   traMADol  (ULTRAM ) tablet 50 mg    -I have reviewed the patients home medicines and have made adjustments as needed   Reevaluation: After the interventions noted above, I reevaluated the patient and found that they have :improved  Co morbidities that complicate the patient evaluation  Past Medical History:  Diagnosis Date   Aortic insufficiency    Bronchial asthma    Chronic bronchitis (HCC)    numerous years; not q yr    Complication of anesthesia 1999   had 3 seizures & ended up in ICU days after I'd had anesthesia    Diverticulosis of colon    Dysrhythmia    PVC's   GERD (gastroesophageal reflux disease)    History of hiatal hernia    HTN (hypertension)    Hypercholesteremia    Hypertension    LVH (left ventricular hypertrophy)    Obesity    Osteoarthrosis    Pneumonia 2000's X 1   Premature atrial contraction    PVC's (premature ventricular contractions)    Recurrent UTI    Seizures (HCC) 1999 X 3   days after anesthesia- no further issues   Stroke (HCC)    /MRI from 1999;; showed I'd had had several small ones-no residual effects.   Type II diabetes mellitus (HCC)       Dispostion: Discussed with hospitalist will evaluate patient for admission.    Final diagnoses:  Polyarthralgia  Weakness  Cellulitis of left upper extremity  Hypomagnesemia    ED Discharge Orders     None          Hildegard Loge, PA-C 12/29/23 1347    Long, Fonda MATSU, MD 12/30/23 [phone]

## 2023-12-29 NOTE — Plan of Care (Signed)
   Problem: Education: Goal: Ability to describe self-care measures that may prevent or decrease complications (Diabetes Survival Skills Education) will improve Outcome: Progressing Goal: Individualized Educational Video(s) Outcome: Progressing   Problem: Coping: Goal: Ability to adjust to condition or change in health will improve Outcome: Progressing

## 2023-12-30 DIAGNOSIS — M25552 Pain in left hip: Secondary | ICD-10-CM | POA: Diagnosis present

## 2023-12-30 DIAGNOSIS — R5381 Other malaise: Secondary | ICD-10-CM | POA: Diagnosis not present

## 2023-12-30 DIAGNOSIS — M1612 Unilateral primary osteoarthritis, left hip: Secondary | ICD-10-CM | POA: Diagnosis not present

## 2023-12-30 DIAGNOSIS — R531 Weakness: Secondary | ICD-10-CM

## 2023-12-30 LAB — CBC
HCT: 30.9 % — ABNORMAL LOW (ref 36.0–46.0)
Hemoglobin: 10.4 g/dL — ABNORMAL LOW (ref 12.0–15.0)
MCH: 31.5 pg (ref 26.0–34.0)
MCHC: 33.7 g/dL (ref 30.0–36.0)
MCV: 93.6 fL (ref 80.0–100.0)
Platelets: 275 K/uL (ref 150–400)
RBC: 3.3 MIL/uL — ABNORMAL LOW (ref 3.87–5.11)
RDW: 12.8 % (ref 11.5–15.5)
WBC: 5.5 K/uL (ref 4.0–10.5)
nRBC: 0 % (ref 0.0–0.2)

## 2023-12-30 LAB — BASIC METABOLIC PANEL WITH GFR
Anion gap: 11 (ref 5–15)
BUN: 13 mg/dL (ref 8–23)
CO2: 29 mmol/L (ref 22–32)
Calcium: 8.5 mg/dL — ABNORMAL LOW (ref 8.9–10.3)
Chloride: 97 mmol/L — ABNORMAL LOW (ref 98–111)
Creatinine, Ser: 0.51 mg/dL (ref 0.44–1.00)
GFR, Estimated: >60 mL/min (ref >60)
Glucose, Bld: 147 mg/dL — ABNORMAL HIGH (ref 70–99)
Potassium: 3.9 mmol/L (ref 3.5–5.1)
Sodium: 137 mmol/L (ref 135–145)

## 2023-12-30 LAB — GLUCOSE, CAPILLARY
Glucose-Capillary: 132 mg/dL — ABNORMAL HIGH (ref 70–99)
Glucose-Capillary: 216 mg/dL — ABNORMAL HIGH (ref 70–99)
Glucose-Capillary: 187 mg/dL — ABNORMAL HIGH (ref 70–99)
Glucose-Capillary: 345 mg/dL — ABNORMAL HIGH (ref 70–99)

## 2023-12-30 MED ORDER — AMLODIPINE BESYLATE 10 MG PO TABS
10.0000 mg | ORAL_TABLET | Freq: Every day | ORAL | Status: DC
  Administered 2023-12-31 – 2024-01-02 (×3): 10 mg via ORAL
  Filled 2023-12-30 (×3): qty 1

## 2023-12-30 MED ORDER — INSULIN GLARGINE-YFGN 100 UNIT/ML ~~LOC~~ SOLN
5.0000 [IU] | Freq: Every day | SUBCUTANEOUS | Status: DC
  Administered 2023-12-30: 5 [IU] via SUBCUTANEOUS
  Filled 2023-12-30 (×2): qty 0.05

## 2023-12-30 MED ORDER — METHYLPREDNISOLONE SODIUM SUCC 40 MG IJ SOLR
40.0000 mg | Freq: Every day | INTRAMUSCULAR | Status: DC
  Administered 2023-12-30 – 2023-12-31 (×2): 40 mg via INTRAVENOUS
  Filled 2023-12-30 (×2): qty 1

## 2023-12-30 MED ORDER — POLYETHYLENE GLYCOL 3350 17 G PO PACK
17.0000 g | PACK | Freq: Every day | ORAL | Status: DC | PRN
  Administered 2023-12-30: 17 g via ORAL
  Filled 2023-12-30: qty 1

## 2023-12-30 NOTE — TOC Initial Note (Addendum)
 Transition of Care [facility]) - Initial/Assessment Note    Patient Details  Name: Kathleen Oliver MRN: [id number] Date of Birth: 1945-09-08  [facility] CM/SW Contact:    Doneta Glenys DASEN, RN Phone Number: 12/30/2023, 11:49 AM  Clinical Narrative:                 MOON completed. CM meet with patient in the room. PTA lives in a condo alone;PCP/insurance verified;DME - cane walker, WC, BSC,[place], Denies any HH, oxygen or SDOH needs; Patients daughter will transport home at discharge.  TOC follow progression to discharge 12:16 PM CM spoke with patient about PT recommendation for SNF. Patient agreeable to starting the work-up process. MUST ID - [id number], PASRR # [id number] A 12:22 PM Waiting bed offers  Expected Discharge Plan: Home w Home Health Services Barriers to Discharge: Continued Medical Work up   Patient Goals and CMS Choice Patient states their goals for this hospitalization and ongoing recovery are:: Home with [facility] if strong enough CMS Medicare.gov Compare Post Acute Care list provided to::  (NA) Choice offered to / list presented to : NA [place] ownership interest in [facility].provided to:: Parent NA    Expected Discharge Plan and Services In-house Referral: NA Discharge Planning Services: CM Consult Post Acute Care Choice: NA Living arrangements for the past 2 months: Single Family Home                 DME Arranged: N/A DME Agency: NA       HH Arranged: NA HH Agency: NA        Prior Living Arrangements/Services Living arrangements for the past 2 months: Single Family Home Lives with:: Self Patient language and need for interpreter reviewed:: Yes Do you feel safe going back to the place where you live?: Yes      Need for Family Participation in Patient Care: No (Comment) Care giver support system in place?: Yes (comment) Current home services: DME (cane, walker, WC, BSC, [place]) Criminal Activity/Legal Involvement Pertinent to Current  Situation/Hospitalization: No - Comment as needed  Activities of Daily Living   ADL Screening (condition at time of admission) Independently performs ADLs?: Yes (appropriate for developmental age) Is the patient deaf or have difficulty hearing?: No Does the patient have difficulty seeing, even when wearing glasses/contacts?: No Does the patient have difficulty concentrating, remembering, or making decisions?: No  Permission Sought/Granted Permission sought to share information with : Case Manager Permission granted to share information with : Yes, Verbal Permission Granted  Share Information with NAME: Bernell Crank (Daughter)  [phone] (Home Phone)           Emotional Assessment Appearance:: Appears stated age Attitude/Demeanor/Rapport: Engaged Affect (typically observed): Appropriate Orientation: : Oriented to Self, Oriented to Place, Oriented to  Time, Oriented to Situation Alcohol / Substance Use: Not Applicable Psych Involvement: No (comment)  Admission diagnosis:  Hypomagnesemia [E83.42] Osteoarthritis [M19.90] Polyarthralgia [M25.50] Weakness [R53.1] Cellulitis of left upper extremity [L03.114] Patient Active Problem List   Diagnosis Date Noted   Left hip pain 12/30/2023   Generalized weakness 12/30/2023   Osteoarthritis 12/29/2023   Pain due to onychomycosis of toenails of both feet 05/04/2023   Decreased heart rate 06/16/2022   Nonallergic rhinitis 06/12/2021   Reactive airway disease without complication 06/12/2021   Status post total knee replacement, right 02/24/2018   Palpitations 05/29/2015   CAD (coronary artery disease) 08/23/2014   GERD (gastroesophageal reflux disease)    Diabetes mellitus without complication (HCC)  Hypertension    Hypercholesteremia    Obesity    Osteoarthrosis    Anxiety    PCP:  Dwight Trula SQUIBB, MD Pharmacy:   [facility] [address] [place], [place] - [address] [place]  [address] Phone: [phone] Fax: [phone]     Social Drivers of Health (SDOH) Social History: SDOH Screenings   Food Insecurity: No Food Insecurity (12/29/2023)  Housing: Low Risk  (12/29/2023)  Transportation Needs: No Transportation Needs (12/29/2023)  Utilities: Not At Risk (12/29/2023)  Social Connections: Moderately Integrated (12/29/2023)  Tobacco Use: Medium Risk (12/29/2023)   SDOH Interventions:     Readmission Risk Interventions     No data to display

## 2023-12-30 NOTE — Evaluation (Signed)
 Physical Therapy Evaluation Patient Details Name: Kathleen Oliver MRN: [id number] DOB: Apr 30, 1946 Today's Date: 12/30/2023  History of Present Illness  Kathleen Oliver is a 78 y.o. female being admitted to the hospital with severe left hip pain, in the anterior part of the left hip. Painless PROM, reports having a hard time standing up, negative for evidence of septic joint or any other acute findings such as fracture. Xray b/l shoulders show OA, Xray of L hip/pelvis shows OA; CT of L hip shows OA and mild joint effusion. Of note, recent [place] ED visit for erythema and swelling of L hand  PMH: HTN, hyperlipidemia, non-insulin -dependent diabetes, osteoarthritis, GERD, seizures, hx of stroke, R TKA 2019  Clinical Impression  Pt presents supine in bed, complaining of L shoulder and L hip pain with reduced mobility not at her baseline; she lives alone in a condo with 3 steps to enter and single handrail, typically does not use any device but lately has required use of RW with limited use of LUE and hobbling to the bathroom. Pt required modA with bed mobility withHOB elevated and use of bed rails, completed STS and SPT with HHA and CGA to [facility] and recliner, declined ambulation this date citing 9/10 pain. Pt has all recommended DME. Patient will benefit from continued inpatient follow up therapy, <3 hours/day. We will continue to follow acutely.         If plan is discharge home, recommend the following: A little help with walking and/or transfers;A little help with bathing/dressing/bathroom;Assistance with cooking/housework;Assistance with feeding;Assist for transportation;Help with stairs or ramp for entrance   Can travel by private vehicle   No    Equipment Recommendations None recommended by PT (TBD at SNF, pt has much of the recommended DME)  Recommendations for Other Services       Functional Status Assessment Patient has had a recent decline in their functional status and demonstrates the  ability to make significant improvements in function in a reasonable and predictable amount of time.     Precautions / Restrictions Precautions Precautions: Fall Restrictions Weight Bearing Restrictions Per Provider Order: No      Mobility  Bed Mobility Overal bed mobility: Needs Assistance Bed Mobility: Supine to Sit     Supine to sit: HOB elevated, Used rails, Mod assist     General bed mobility comments: modA for trunk elevation and to assist BLe off bed    Transfers Overall transfer level: Needs assistance Equipment used: 1 person hand held assist Transfers: Sit to/from Stand, Bed to chair/wheelchair/BSC Sit to Stand: Contact guard assist Stand pivot transfers: Contact guard assist         General transfer comment: Pt CGA for STS and SPT with HHA, no physical assis t required other than steadying.    Ambulation/Gait               General Gait Details: Unable at present  Stairs            Wheelchair Mobility     Tilt Bed    Modified Rankin (Stroke Patients Only)       Balance Overall balance assessment: Needs assistance Sitting-balance support: Feet supported, No upper extremity supported Sitting balance-Leahy Scale: Good     Standing balance support: No upper extremity supported, Reliant on assistive device for balance Standing balance-Leahy Scale: Poor Standing balance comment: Reliant on steadying from PT, able to weightbear on BLE without giving way  Pertinent Vitals/Pain Pain Assessment Pain Assessment: 0-10 Pain Score: 7  Pain Location: L hip, L shoulder Pain Descriptors / Indicators: Grimacing, Guarding, Tender Pain Intervention(s): Limited activity within patient's tolerance, Monitored during session, Premedicated before session, Repositioned    Home Living Family/patient expects to be discharged to:: Private residence Living Arrangements: Alone Available Help at Discharge:  Neighbor;Available PRN/intermittently Type of Home: House Home Access: Stairs to enter Entrance Stairs-Rails: Lawyer of Steps: 3   Home Layout: One level Home Equipment: Agricultural consultant (2 wheels);Cane - single point;Wheelchair - manual;BSC/3in1;Shower seat      Prior Function Prior Level of Function : Independent/Modified Independent;Driving             Mobility Comments: Normally does not utilize AD, sometimes takes [facility] for community mobility, for the past week has utilized RW for mobility in setting of L hip pain ADLs Comments: Recently, requires assist with getting dressed (sister/daughter), restricted L shoulder     Extremity/Trunk Assessment   Upper Extremity Assessment Upper Extremity Assessment: Defer to OT evaluation (L shoulder reduced mobility and pain)    Lower Extremity Assessment Lower Extremity Assessment: Generalized weakness;LLE deficits/detail;RLE deficits/detail RLE Deficits / Details: MMT grossly 3+/5 at hip, 4+/5 knee, and 4-/5 at ankle. RLE Sensation: WNL LLE Deficits / Details: hip MMT not tested due to pain, AROM limited especially into hip flexion (unable), all other directions reduced and painful; PROM painless until ~45deg hip flexion then pain, ER and IR painful, ADD/ABDuction painfuil. LLE: Unable to fully assess due to pain    Cervical / Trunk Assessment Cervical / Trunk Assessment: Kyphotic  Communication   Communication Communication: No apparent difficulties    Cognition Arousal: Alert Behavior During Therapy: WFL for tasks assessed/performed   PT - Cognitive impairments: No apparent impairments                       PT - Cognition Comments: A&Ox4 Following commands: Intact       Cueing Cueing Techniques: Verbal cues     General Comments      Exercises General Exercises - Lower Extremity Ankle Circles/Pumps: AROM, Both, 20 reps, Seated   Assessment/Plan    PT Assessment Patient needs  continued PT services  PT Problem List Decreased strength;Decreased range of motion;Decreased activity tolerance;Decreased balance;Decreased mobility;Decreased coordination;Pain       PT Treatment Interventions DME instruction;Gait training;Stair training;Functional mobility training;Therapeutic activities;Therapeutic exercise;Balance training;Patient/family education    PT Goals (Current goals can be found in the Care Plan section)  Acute Rehab PT Goals Patient Stated Goal: To reduce pain PT Goal Formulation: With patient Time For Goal Achievement: 01/13/24 Potential to Achieve Goals: Good    Frequency Min 1X/week     Co-evaluation               AM-PAC PT 6 Clicks Mobility  Outcome Measure Help needed turning from your back to your side while in a flat bed without using bedrails?: A Little Help needed moving from lying on your back to sitting on the side of a flat bed without using bedrails?: A Little Help needed moving to and from a bed to a chair (including a wheelchair)?: A Little Help needed standing up from a chair using your arms (e.g., wheelchair or bedside chair)?: A Little Help needed to walk in hospital room?: Total Help needed climbing 3-5 steps with a railing? : Total 6 Click Score: 14    End of Session Equipment Utilized During Treatment: Gait belt  Activity Tolerance: Patient tolerated treatment well;No increased pain Patient left: in chair;with call bell/phone within reach;with chair alarm set Nurse Communication: Mobility status PT Visit Diagnosis: Pain;Difficulty in walking, not elsewhere classified (R26.2);Other abnormalities of gait and mobility (R26.89) Pain - Right/Left: Left Pain - part of body: Shoulder;Hip    Time: 8957-8879 PT Time Calculation (min) (ACUTE ONLY): 38 min   Charges:   PT Evaluation $PT Eval Low Complexity: 1 Low PT Treatments $Therapeutic Activity: 23-37 mins PT General Charges $$ ACUTE PT VISIT: 1 Visit          Elsie Grieves, PT, DPT [facility] Department Office: [phone]  Elsie Grieves 12/30/2023, 11:22 AM

## 2023-12-30 NOTE — Hospital Course (Addendum)
 Kathleen Oliver is a 78 y.o. female with medical history significant for hypertension, hyperlipidemia, non-insulin -dependent diabetes, osteoarthritis being admitted to the hospital with left hip pain.   Patient states that she lives on her own, has been in her usual state of health until a couple days ago when she noticed swelling and erythema on the dorsum of her left hand.  Says that she often hits the back of her hand on door frames, etc. and sometimes breaks the skin.  In any case she was seen in the ER yesterday was prescribed Keflex  and today the erythema and swelling is significantly improved.   She denies any fevers or chills, however this morning when she tried to get up she had pretty severe left hip pain, in the anterior part of the left hip.  It does not hurt with passive range of motion, but she has a hard time standing up as it feels like her leg is going to give out, and she has muscular pain.  Workup in the emergency department as noted below was negative for evidence of septic joint or any other acute findings such as fracture.  Due to the patient's pain and inability to ambulate, hospitalist observation admission was requested.     Assessment & Plan:   Principal Problem:   Osteoarthritis Active Problems:   GERD (gastroesophageal reflux disease)   Diabetes mellitus without complication (HCC)   Hypertension   Hypercholesteremia   Osteoarthrosis   CAD (coronary artery disease)   Left hip pain   Generalized weakness  Assessment/Plan  Sever Left hip osteoarthritis - Started on IV steroids -and NSAIDs, reporting improved pain at rest -Willing to try more mobility today  -With ambulatory dysfunction -Will try anti-inflammatory parameters -CT of the head was reviewed, negative for any fracture dislocation or septic joint mild effusion  - IV Solu-Medrol  - (in the setting of diabetes anticipating hyperglycemia) - Will continue with as needed analgesics - Continue PT OT     Left hand cellulitis - Much improved erythema edema-improved range of motion -Continue p.o. Keflex    Type 2 diabetes - Hyperglycemic on IV Solu-Medrol  -Long-acting insulin  Semglee  5 mg IV twice daily initiated -Likely CBG q. ACHS, SSI coverage, titrating up -hold metformin ,  - carb modified diet    Severe debility -due to hip pain -PT/OT consulted -SNF versus home with home health  (patient lives alone at home-due to this event patient unable to ambulate or carry ADLs)   hypertension-continue home amlodipine  and losartan 

## 2023-12-30 NOTE — Care Management Obs Status (Signed)
 MEDICARE OBSERVATION STATUS NOTIFICATION   Patient Details  Name: Kathleen Oliver MRN: [id number] Date of Birth: 1945/11/04   Medicare Observation Status Notification Given:  Yes    Doneta Glenys DASEN, RN 12/30/2023, 11:13 AM

## 2023-12-30 NOTE — NC FL2 (Signed)
 [place]  MEDICAID FL2 LEVEL OF CARE FORM     IDENTIFICATION  Patient Name: Kathleen Oliver Birthdate: 15-Feb-1946 Sex: female Admission Date (Current Location): 12/29/2023  [facility] and [place] Number:  Producer, television/film/video and Address:  [facility],  [address], [place] [address]      Provider Number: [id number]  Attending Physician Name and Address:  Willette Adriana LABOR, MD  Relative Name and Phone Number:       Current Level of Care: Hospital Recommended Level of Care: Skilled Nursing Facility Prior Approval Number:    Date Approved/Denied:   PASRR Number: [id number]  Discharge Plan: SNF    Current Diagnoses: Patient Active Problem List   Diagnosis Date Noted   Left hip pain 12/30/2023   Generalized weakness 12/30/2023   Osteoarthritis 12/29/2023   Pain due to onychomycosis of toenails of both feet 05/04/2023   Decreased heart rate 06/16/2022   Nonallergic rhinitis 06/12/2021   Reactive airway disease without complication 06/12/2021   Status post total knee replacement, right 02/24/2018   Palpitations 05/29/2015   CAD (coronary artery disease) 08/23/2014   GERD (gastroesophageal reflux disease)    Diabetes mellitus without complication (HCC)    Hypertension    Hypercholesteremia    Obesity    Osteoarthrosis    Anxiety     Orientation RESPIRATION BLADDER Height & Weight     Self, Time, Situation, Place  Normal Continent Weight: 52.2 kg Height:  5' 1 (154.9 cm)  BEHAVIORAL SYMPTOMS/MOOD NEUROLOGICAL BOWEL NUTRITION STATUS      Continent Diet  AMBULATORY STATUS COMMUNICATION OF NEEDS Skin   Limited Assist Verbally Normal                       Personal Care Assistance Level of Assistance  Bathing, Feeding, Dressing Bathing Assistance: Limited assistance Feeding assistance: Independent Dressing Assistance: Limited assistance     Functional Limitations Info             SPECIAL CARE FACTORS FREQUENCY  PT (By licensed PT),  OT (By licensed OT)     PT Frequency: 5X weekly OT Frequency: 5X weekly            Contractures Contractures Info: Not present    Additional Factors Info  Code Status, Allergies, Insulin  Sliding Scale Code Status Info: Full Allergies Info: Tizanidine, Atorvastatin, Buspirone Hcl, Colesevelam, Cinoxacin, Codeine, Penicillins   Insulin  Sliding Scale Info: insulin  aspart (novoLOG ) injection 0-15 Units 0-15 Units, Subcutaneous, 3 times daily with meals, First dose on Thu 12/29/23 at 1700  Correction coverage: Moderate (average weight, post-op)  CBG < 70: Implement Hypoglycemia Standing Orders and refer to Hypoglycemia Standing Orders sidebar report  CBG 70 - 120: 0 units  CBG 121 - 150: 2 units  CBG 151 - 200: 3 units  CBG 201 - 250: 5 units  CBG 251 - 300: 8 units  CBG 301 - 350: 11 units  CBG 351 - 400: 15 units  CBG > 400: call MD and obtain STAT lab verification Zella, Mir M, MD Inpatient  insulin  aspart (novoLOG ) injection 0-5 Units 0-5 Units, Subcutaneous, insulin  glargine-yfgn (SEMGLEE ) injection 5 Units 5 Units, Subcutaneous,       Current Medications (12/30/2023):  This is the current hospital active medication list Current Facility-Administered Medications  Medication Dose Route Frequency Provider Last Rate Last Admin   acetaminophen  (TYLENOL ) tablet 650 mg  650 mg Oral Q6H PRN Zella, Mir M, MD   650 mg at  12/29/23 1617   Or   acetaminophen  (TYLENOL ) suppository 650 mg  650 mg Rectal Q6H PRN Zella, Mir M, MD       albuterol  (PROVENTIL ) (2.5 MG/3ML) 0.083% nebulizer solution 2.5 mg  2.5 mg Nebulization Q2H PRN Zella, Mir M, MD       NOREEN ON 12/31/2023] amLODipine  (NORVASC ) tablet 10 mg  10 mg Oral Daily Shahmehdi, Seyed A, MD       cephALEXin  (KEFLEX ) capsule 500 mg  500 mg Oral QID Zella, Mir M, MD   500 mg at 12/30/23 9062   enoxaparin  (LOVENOX ) injection 40 mg  40 mg Subcutaneous Q24H Zella, Mir M, MD   40 mg at 12/29/23 1609   gabapentin  (NEURONTIN )  capsule 100 mg  100 mg Oral QHS Zella, Mir M, MD   100 mg at 12/29/23 2147   insulin  aspart (novoLOG ) injection 0-15 Units  0-15 Units Subcutaneous TID WC Zella Mir M, MD   2 Units at 12/30/23 9187   insulin  aspart (novoLOG ) injection 0-5 Units  0-5 Units Subcutaneous QHS Zella, Mir M, MD       insulin  glargine-yfgn (SEMGLEE ) injection 5 Units  5 Units Subcutaneous Daily Shahmehdi, Seyed A, MD       losartan  (COZAAR ) tablet 100 mg  100 mg Oral Daily Ikramullah, Mir M, MD   100 mg at 12/30/23 9062   methylPREDNISolone  sodium succinate (SOLU-MEDROL ) 40 mg/mL injection 40 mg  40 mg Intravenous Daily Shahmehdi, Seyed A, MD       ondansetron  (ZOFRAN ) tablet 4 mg  4 mg Oral Q6H PRN Zella, Mir M, MD       Or   ondansetron  (ZOFRAN ) injection 4 mg  4 mg Intravenous Q6H PRN Zella, Mir M, MD       pantoprazole  (PROTONIX ) EC tablet 20 mg  20 mg Oral BID Ikramullah, Mir M, MD   20 mg at 12/30/23 9057   traMADol  (ULTRAM ) tablet 50 mg  50 mg Oral Q6H PRN Zella Katha HERO, MD   50 mg at 12/30/23 9187   traZODone  (DESYREL ) tablet 25 mg  25 mg Oral QHS PRN Zella Katha HERO, MD         Discharge Medications: Please see discharge summary for a list of discharge medications.  Relevant Imaging Results:  Relevant Lab Results:   Additional Information SSN [id number]  Doneta Glenys DASEN, RN

## 2023-12-30 NOTE — Evaluation (Signed)
 Occupational Therapy Evaluation Patient Details Name: Kathleen Oliver MRN: [id number] DOB: 1945-06-30 Today's Date: 12/30/2023   History of Present Illness   Kathleen Oliver is a 78 y.o. female being admitted to the hospital with severe left hip pain, in the anterior part of the left hip. Painless PROM, reports having a hard time standing up, negative for evidence of septic joint or any other acute findings such as fracture. Xray b/l shoulders show OA, Xray of L hip/pelvis shows OA; CT of L hip shows OA and mild joint effusion. Of note, recent [place] ED visit for erythema and swelling of L hand  PMH: HTN, hyperlipidemia, non-insulin -dependent diabetes, osteoarthritis, GERD, seizures, hx of stroke, R TKA 2019     Clinical Impressions PTA, patient lives at home alone and mod independent with RW and driving. Currently, patient presents with deficits outlined below (see OT Problem List for details) most significantly L UE/Le pain, decreased ROM, strength, coordination, balance, activity tolerance limiting ADL's and functional mobility. Use of gait belt with hemi walker with R UE somewhat effective this session for BSC transfers and balance support in standing. Patient requires continued Acute care hospital level OT services to progress safety and functional performance and allow for discharge. Patient will benefit from continued inpatient follow up therapy, <3 hours/day.        If plan is discharge home, recommend the following:   Two people to help with walking and/or transfers;A lot of help with bathing/dressing/bathroom;Direct supervision/assist for medications management;Direct supervision/assist for financial management;Assist for transportation;Help with stairs or ramp for entrance     Functional Status Assessment   Patient has had a recent decline in their functional status and demonstrates the ability to make significant improvements in function in a reasonable and predictable amount  of time.     Equipment Recommendations   None recommended by OT (TBA post rehab)      Precautions/Restrictions   Precautions Precautions: Fall Restrictions Weight Bearing Restrictions Per Provider Order: No     Mobility Bed Mobility Overal bed mobility: Needs Assistance Bed Mobility: Sit to Supine     Supine to sit: Min assist, HOB elevated, Used rails     General bed mobility comments: cues for techniques    Transfers Overall transfer level: Needs assistance Equipment used: Hemi-walker Transfers: Sit to/from Stand, Bed to chair/wheelchair/BSC Sit to Stand: Contact guard assist Stand pivot transfers: Contact guard assist         General transfer comment: Pt CGA for STS and SPT with hemi walker other than steadying.      Balance Overall balance assessment: Needs assistance Sitting-balance support: Feet supported, No upper extremity supported Sitting balance-Leahy Scale: Good     Standing balance support: No upper extremity supported, Reliant on assistive device for balance Standing balance-Leahy Scale: Poor Standing balance comment: Reliant on steadying from PT, able to weightbear on BLE without giving way                           ADL either performed or assessed with clinical judgement   ADL Overall ADL's : Needs assistance/impaired Eating/Feeding: Set up;Sitting   Grooming: Wash/dry hands;Wash/dry face;Oral care;Minimal assistance;Sitting Grooming Details (indicate cue type and reason): difficulty with L Ue integration Upper Body Bathing: Minimal assistance;Sitting   Lower Body Bathing: Maximal assistance;Sitting/lateral leans   Upper Body Dressing : Maximal assistance;Sitting   Lower Body Dressing: Maximal assistance;Sitting/lateral leans   Toilet Transfer: Minimal assistance;BSC/3in1 (hemi walker)  Toileting- Clothing Manipulation and Hygiene: Moderate assistance;Sitting/lateral lean       Functional mobility during ADLs:  Minimal assistance (hemi walker on r side) General ADL Comments: pain on L side interferes with all function     Vision Baseline Vision/History: 0 No visual deficits;1 Wears glasses Ability to See in Adequate Light: 0 Adequate Patient Visual Report: No change from baseline Vision Assessment?: No apparent visual deficits;Wears glasses for reading            Pertinent Vitals/Pain Pain Assessment Pain Assessment: 0-10 Pain Score: 5  Pain Location: L hip, L shoulder Pain Descriptors / Indicators: Grimacing, Guarding, Tender Pain Intervention(s): Limited activity within patient's tolerance, Monitored during session, Premedicated before session, Repositioned     Extremity/Trunk Assessment Upper Extremity Assessment Upper Extremity Assessment: Right hand dominant;LUE deficits/detail LUE Deficits / Details: pain with shoulder flexion/abd 0-25 LUE: Shoulder pain with ROM;Shoulder pain at rest LUE Coordination: decreased fine motor;decreased gross motor   Lower Extremity Assessment Lower Extremity Assessment: Defer to PT evaluation   Cervical / Trunk Assessment Cervical / Trunk Assessment: Kyphotic   Communication Communication Communication: No apparent difficulties   Cognition Arousal: Alert Behavior During Therapy: WFL for tasks assessed/performed Cognition: No apparent impairments                               Following commands: Intact       Cueing  General Comments   Cueing Techniques: Verbal cues  mild tenderness in L dorsum of hand from previous cellulitis           Home Living Family/patient expects to be discharged to:: Private residence Living Arrangements: Alone Available Help at Discharge: Neighbor;Available PRN/intermittently Type of Home: House Home Access: Stairs to enter [employer] of Steps: 3 Entrance Stairs-Rails: Left;Right Home Layout: One level     Bathroom Shower/Tub: Tub/shower unit;Curtain   Bathroom Toilet:  Handicapped height Bathroom Accessibility: Yes How Accessible: Accessible via walker Home Equipment: Agricultural consultant (2 wheels);Cane - single point;Wheelchair - manual;BSC/3in1;Shower seat;Grab bars - tub/shower;Hand held shower head;Adaptive equipment Adaptive Equipment: Reacher        Prior Functioning/Environment Prior Level of Function : Independent/Modified Independent;Driving             Mobility Comments: Normally does not utilize AD, sometimes takes [facility] for community mobility, for the past week has utilized RW for mobility in setting of L hip pain ADLs Comments: Recently, requires assist with getting dressed (sister/daughter), restricted L shoulder, did all own cooking, driving since last week    OT Problem List: Decreased strength;Decreased range of motion;Decreased activity tolerance;Impaired balance (sitting and/or standing);Decreased coordination;Decreased knowledge of use of DME or AE;Decreased knowledge of precautions;Impaired UE functional use;Pain;Increased edema   OT Treatment/Interventions: Self-care/ADL training;Therapeutic exercise;Neuromuscular education;Energy conservation;DME and/or AE instruction;Manual therapy;Modalities;Therapeutic activities;Patient/family education;Balance training      OT Goals(Current goals can be found in the care plan section)   Acute Rehab OT Goals Patient Stated Goal: to feel better OT Goal Formulation: With patient/family Time For Goal Achievement: 01/13/24 Potential to Achieve Goals: Good ADL Goals Pt Will Perform Upper Body Bathing: with contact guard assist;sitting Pt Will Perform Lower Body Bathing: with min assist;sitting/lateral leans Pt Will Perform Upper Body Dressing: with min assist;sitting Pt Will Perform Lower Body Dressing: with min assist;sit to/from stand Pt Will Transfer to Toilet: ambulating;with min assist Pt Will Perform Toileting - Clothing Manipulation and hygiene: sitting/lateral leans;with supervision    OT Frequency:  Min 2X/week       AM-PAC OT 6 Clicks Daily Activity     Outcome Measure Help from another person eating meals?: A Little Help from another person taking care of personal grooming?: A Little Help from another person toileting, which includes using toliet, bedpan, or urinal?: A Lot Help from another person bathing (including washing, rinsing, drying)?: A Lot Help from another person to put on and taking off regular upper body clothing?: A Lot Help from another person to put on and taking off regular lower body clothing?: A Lot 6 Click Score: 14   End of Session Equipment Utilized During Treatment: Gait belt;Other (comment) (hemi walker) Nurse Communication: Mobility status;Other (comment) (voding info on flowsheets)  Activity Tolerance: Patient limited by pain Patient left: in bed;with call bell/phone within reach;with bed alarm set;with family/visitor present  OT Visit Diagnosis: Unsteadiness on feet (R26.81);Other abnormalities of gait and mobility (R26.89);Muscle weakness (generalized) (M62.81);Pain Pain - part of body:  (L UE/LE)                Time: 1330-1400 OT Time Calculation (min): 30 min Charges:  OT General Charges $OT Visit: 1 Visit OT Evaluation $OT Eval Low Complexity: 1 Low OT Treatments $Self Care/Home Management : 8-22 mins  Kathleen Oliver OT/L Acute Rehabilitation Department  [phone]  12/30/2023, 4:03 PM

## 2023-12-30 NOTE — Progress Notes (Signed)
 PROGRESS NOTE    Patient: Kathleen Oliver                            PCP: Dwight Trula SQUIBB, MD                    DOB: April 27, 1946            DOA: 12/29/2023 [id number]             DOS: 12/30/2023, 10:12 AM   LOS: 0 days   Date of Service: The patient was seen and examined on 12/30/2023  Subjective:   The patient was seen and examined this morning. Hemodynamically stable. No issues overnight .  Brief Narrative:    Kathleen Oliver is a 78 y.o. female with medical history significant for hypertension, hyperlipidemia, non-insulin -dependent diabetes, osteoarthritis being admitted to the hospital with left hip pain.   Patient states that she lives on her own, has been in her usual state of health until a couple days ago when she noticed swelling and erythema on the dorsum of her left hand.  Says that she often hits the back of her hand on door frames, etc. and sometimes breaks the skin.  In any case she was seen in the ER yesterday was prescribed Keflex  and today the erythema and swelling is significantly improved.   She denies any fevers or chills, however this morning when she tried to get up she had pretty severe left hip pain, in the anterior part of the left hip.  It does not hurt with passive range of motion, but she has a hard time standing up as it feels like her leg is going to give out, and she has muscular pain.  Workup in the emergency department as noted below was negative for evidence of septic joint or any other acute findings such as fracture.  Due to the patient's pain and inability to ambulate, hospitalist observation admission was requested.     Assessment & Plan:   Principal Problem:   Osteoarthritis Active Problems:   GERD (gastroesophageal reflux disease)   Diabetes mellitus without complication (HCC)   Hypertension   Hypercholesteremia   Osteoarthrosis   CAD (coronary artery disease)   Left hip pain   Generalized weakness  Assessment/Plan    Sever Left hip  osteoarthritis - Causing pain and mobility limitation -With ambulatory dysfunction -Will try anti-inflammatory parameters -CT of the head was reviewed, negative for any fracture dislocation or septic joint mild effusion  -Anticipating a dose of steroids (in the setting of diabetes anticipating hyperglycemia) - Will continue with as needed analgesics -PT/OT consult   Left hand cellulitis - Much improved erythema edema-improved range of motion -Continue p.o. Keflex    Type 2 diabetes - Anticipating hyperglycemia with IV steroids -hold metformin ,  - carb modified diet with sliding scale insulin  in the meantime   Severe debility  Causing PT OT, patient is amenable to short-term rehab if recommended   hypertension-continue home amlodipine  and losartan    ----------------------------------------------------------------------------------------------------------------------- Nutritional status:  The patient's BMI is: Body mass index is 21.73 kg/m. I agree with the assessment and plan as outlined below: Nutrition Status:        Skin Assessment: I have examined the patient's skin and I agree with the wound assessment as performed by wound care team  ----------------------------------------------------------------------------------------------------------------------- DVT prophylaxis:  enoxaparin  (LOVENOX ) injection 40 mg Start: 12/29/23 1400   Code Status:   Code Status:  Full Code  Family Communication: No family member present at bedside- -Advance care planning has been discussed.   Admission status:   Status is: Observation The patient remains OBS appropriate and will d/c before 2 midnights.   Disposition: From  - home             Planning for discharge in 1-2 days: to   Procedures:   No admission procedures for hospital encounter.   Antimicrobials:  Anti-infectives (From admission, onward)    Start     Dose/Rate Route Frequency Ordered Stop   12/29/23 1400   cephALEXin  (KEFLEX ) capsule 500 mg        500 mg Oral 4 times daily 12/29/23 1351     12/29/23 1145  cefTRIAXone  (ROCEPHIN ) 2 g in sodium chloride  0.9 % 100 mL IVPB        2 g 200 mL/hr over 30 Minutes Intravenous  Once 12/29/23 1140 12/29/23 1259        Medication:   amLODipine   7.5 mg Oral Daily   cephALEXin   500 mg Oral QID   enoxaparin  (LOVENOX ) injection  40 mg Subcutaneous Q24H   gabapentin   100 mg Oral QHS   insulin  aspart  0-15 Units Subcutaneous TID WC   insulin  aspart  0-5 Units Subcutaneous QHS   losartan   100 mg Oral Daily   pantoprazole   20 mg Oral BID    acetaminophen  **OR** acetaminophen , albuterol , ondansetron  **OR** ondansetron  (ZOFRAN ) IV, traMADol , traZODone    Objective:   Vitals:   12/29/23 1608 12/29/23 2010 12/30/23 0004 12/30/23 0429  BP: (!) 166/62 131/62 137/75 (!) 152/67  Pulse:  63 69 70  Resp:  18 18 18   Temp:  98.3 F (36.8 C) 98.6 F (37 C) 98.6 F (37 C)  TempSrc:      SpO2:  96% 96% 97%  Weight:      Height:       No intake or output data in the 24 hours ending 12/30/23 1012 Filed Weights   12/29/23 0739  Weight: 52.2 kg     Physical examination:    General:  AAO x 3,  cooperative, no distress;   HEENT:  Normocephalic, PERRL, otherwise with in Normal limits   Neuro:  CNII-XII intact. , normal motor and sensation, reflexes intact   Lungs:   Clear to auscultation BL, Respirations unlabored,  No wheezes / crackles  Cardio:    S1/S2, RRR, No murmure, No Rubs or Gallops   Abdomen:  Soft, non-tender, bowel sounds active all four quadrants, no guarding or peritoneal signs.  Muscular  skeletal:  Limited exam -global generalized weaknesses - in bed, able to move all 4 extremities,   2+ pulses,  symmetric, No pitting edema  Skin:  Dry, warm to touch, negative for any Rashes,  Wounds: Please see nursing documentation      --------------------------------------------------------------------------------------------------------------------------------    LABs:     Latest Ref Rng & Units 12/30/2023    4:32 AM 12/29/2023    8:11 AM 12/28/2023    6:15 PM  CBC  WBC 4.0 - 10.5 K/uL 5.5  8.3  7.6   Hemoglobin 12.0 - 15.0 g/dL 89.5  89.7  89.1   Hematocrit 36.0 - 46.0 % 30.9  31.3  32.2   Platelets 150 - 400 K/uL 275  219  261       Latest Ref Rng & Units 12/30/2023    4:32 AM 12/29/2023    8:11 AM 12/28/2023    6:15 PM  CMP  Glucose 70 - 99 mg/dL 852  772  779   BUN 8 - 23 mg/dL 13  14  18    Creatinine 0.44 - 1.00 mg/dL 9.48  9.35  9.30   Sodium 135 - 145 mmol/L 137  133  131   Potassium 3.5 - 5.1 mmol/L 3.9  3.7  4.0   Chloride 98 - 111 mmol/L 97  96  94   CO2 22 - 32 mmol/L 29  27  26    Calcium  8.9 - 10.3 mg/dL 8.5  8.4  9.2   Total Protein 6.5 - 8.1 g/dL  6.8    Total Bilirubin 0.0 - 1.2 mg/dL  0.9    Alkaline Phos 38 - 126 U/L  54    AST 15 - 41 U/L  19    ALT 0 - 44 U/L  19         Micro Results Recent Results (from the past 240 hours)  Resp panel by RT-PCR (RSV, Flu A&B, Covid) Anterior Nasal Swab     Status: None   Collection Time: 12/29/23  7:57 AM   Specimen: Anterior Nasal Swab  Result Value Ref Range Status   SARS Coronavirus 2 by RT PCR NEGATIVE NEGATIVE Final    Comment: (NOTE) SARS-CoV-2 target nucleic acids are NOT DETECTED.  The SARS-CoV-2 RNA is generally detectable in upper respiratory specimens during the acute phase of infection. The lowest concentration of SARS-CoV-2 viral copies this assay can detect is 138 copies/mL. A negative result does not preclude SARS-Cov-2 infection and should not be used as the sole basis for treatment or other patient management decisions. A negative result may occur with  improper specimen collection/handling, submission of specimen other than nasopharyngeal swab, presence of viral mutation(s) within the areas targeted by this assay, and  inadequate number of viral copies(<138 copies/mL). A negative result must be combined with clinical observations, patient history, and epidemiological information. The expected result is Negative.  Fact Sheet for Patients:  BloggerCourse.com  Fact Sheet for Healthcare Providers:  SeriousBroker.it  This test is no t yet approved or cleared by the [place]  FDA and  has been authorized for detection and/or diagnosis of SARS-CoV-2 by FDA under an Emergency Use Authorization (EUA). This EUA will remain  in effect (meaning this test can be used) for the duration of the COVID-19 declaration under Section 564(b)(1) of the Act, 21 U.S.C.section 360bbb-3(b)(1), unless the authorization is terminated  or revoked sooner.       Influenza A by PCR NEGATIVE NEGATIVE Final   Influenza B by PCR NEGATIVE NEGATIVE Final    Comment: (NOTE) The Xpert Xpress SARS-CoV-2/FLU/RSV plus assay is intended as an aid in the diagnosis of influenza from Nasopharyngeal swab specimens and should not be used as a sole basis for treatment. Nasal washings and aspirates are unacceptable for Xpert Xpress SARS-CoV-2/FLU/RSV testing.  Fact Sheet for Patients: BloggerCourse.com  Fact Sheet for Healthcare Providers: SeriousBroker.it  This test is not yet approved or cleared by the [place]  FDA and has been authorized for detection and/or diagnosis of SARS-CoV-2 by FDA under an Emergency Use Authorization (EUA). This EUA will remain in effect (meaning this test can be used) for the duration of the COVID-19 declaration under Section 564(b)(1) of the Act, 21 U.S.C. section 360bbb-3(b)(1), unless the authorization is terminated or revoked.     Resp Syncytial Virus by PCR NEGATIVE NEGATIVE Final    Comment: (NOTE) Fact Sheet for Patients: BloggerCourse.com  Fact Sheet for Healthcare  Providers: SeriousBroker.it  This test is not yet approved or cleared by the [place]  FDA and has been authorized for detection and/or diagnosis of SARS-CoV-2 by FDA under an Emergency Use Authorization (EUA). This EUA will remain in effect (meaning this test can be used) for the duration of the COVID-19 declaration under Section 564(b)(1) of the Act, 21 U.S.C. section 360bbb-3(b)(1), unless the authorization is terminated or revoked.  Performed at [facility], [address] [place], [place] [address]   Blood culture (routine x 2)     Status: None (Preliminary result)   Collection Time: 12/29/23  8:10 AM   Specimen: BLOOD LEFT ARM  Result Value Ref Range Status   Specimen Description   Final    BLOOD LEFT ARM Performed at [facility], [address], [place], [place] [address]    Special Requests   Final    BOTTLES DRAWN AEROBIC AND ANAEROBIC Blood Culture adequate volume Performed at [facility], [address], [place], [place] [address]    Culture   Final    NO GROWTH < 24 HOURS Performed at [facility], [address], [place], [place] [address]    Report Status PENDING  Incomplete  Blood culture (routine x 2)     Status: None (Preliminary result)   Collection Time: 12/29/23  8:15 AM   Specimen: BLOOD LEFT ARM  Result Value Ref Range Status   Specimen Description   Final    BLOOD LEFT ARM Performed at [facility], [address], [place], [place] [address]    Special Requests   Final    BOTTLES DRAWN AEROBIC AND ANAEROBIC Blood Culture adequate volume Performed at [facility], [address], [place], [place] [address]    Culture   Final    NO GROWTH < 24 HOURS Performed at [facility], [address] [place], [place] [address]    Report Status PENDING  Incomplete    Radiology Reports CT Hip Left Wo Contrast Result Date: 12/29/2023 CLINICAL  DATA:  Left hip pain. EXAM: CT OF THE LEFT HIP WITHOUT CONTRAST TECHNIQUE: Multidetector CT imaging of the left hip was performed according to the standard protocol. Multiplanar CT image reconstructions were also generated. RADIATION DOSE REDUCTION: This exam was performed according to the departmental dose-optimization program which includes automated exposure control, adjustment of the mA and/or kV according to patient size and/or use of iterative reconstruction technique. COMPARISON:  Similar radiographs of the left hip dated 12/29/2023. FINDINGS: Bones/Joint/Cartilage No acute fracture or dislocation. Mild joint space narrowing of the left hip with moderate superolateral acetabular osteophytosis. Small left hip joint effusion. The left sacroiliac joint and pubic symphysis are intact with mild degenerative changes. Mild degenerative disc height loss and endplate osteophytosis at L5-S1. Ligaments Ligaments are suboptimally evaluated by CT. Muscles and Tendons Muscles are normal. No muscle atrophy. No intramuscular fluid collection or hematoma. Soft tissue No fluid collection or hematoma. Partially visualized ventral abdominal wall hernia containing mildly distended fluid and gas-filled loops of small bowel and colon. Sigmoid colonic diverticulosis. IMPRESSION: 1. No acute osseous abnormality. 2. Mild-to-moderate osteoarthritis of the left hip. 3. Small left hip joint effusion. 4. Partially visualized ventral abdominal wall hernia containing mildly distended fluid and gas-filled loops of small bowel and colon. Electronically Signed   By: Harrietta Sherry M.D.   On: 12/29/2023 12:44    SIGNED: Adriana DELENA Grams, MD, FHM. FAAFP. Kathleen Oliver -  [facility] hospitalist Time spent - 55 min.  In seeing, evaluating and examining the patient. Reviewing medical records, labs, drawn plan of care. [facility],  Pager (please use [email or web address] to page/ text) Please use Epic Secure Chat for non-urgent communication  (7AM-7PM)  If 7PM-7AM, please contact night-coverage [email or web address], 12/30/2023, 10:12 AM

## 2023-12-30 NOTE — Plan of Care (Signed)

## 2023-12-31 DIAGNOSIS — M1612 Unilateral primary osteoarthritis, left hip: Secondary | ICD-10-CM

## 2023-12-31 DIAGNOSIS — Z880 Allergy status to penicillin: Secondary | ICD-10-CM | POA: Diagnosis not present

## 2023-12-31 DIAGNOSIS — G8929 Other chronic pain: Secondary | ICD-10-CM | POA: Diagnosis present

## 2023-12-31 DIAGNOSIS — I2510 Atherosclerotic heart disease of native coronary artery without angina pectoris: Secondary | ICD-10-CM | POA: Diagnosis present

## 2023-12-31 DIAGNOSIS — Z8261 Family history of arthritis: Secondary | ICD-10-CM | POA: Diagnosis not present

## 2023-12-31 DIAGNOSIS — I10 Essential (primary) hypertension: Secondary | ICD-10-CM | POA: Diagnosis present

## 2023-12-31 DIAGNOSIS — Z833 Family history of diabetes mellitus: Secondary | ICD-10-CM | POA: Diagnosis not present

## 2023-12-31 DIAGNOSIS — K219 Gastro-esophageal reflux disease without esophagitis: Secondary | ICD-10-CM | POA: Diagnosis present

## 2023-12-31 DIAGNOSIS — E1165 Type 2 diabetes mellitus with hyperglycemia: Secondary | ICD-10-CM | POA: Diagnosis not present

## 2023-12-31 DIAGNOSIS — Z8701 Personal history of pneumonia (recurrent): Secondary | ICD-10-CM | POA: Diagnosis not present

## 2023-12-31 DIAGNOSIS — Z8379 Family history of other diseases of the digestive system: Secondary | ICD-10-CM | POA: Diagnosis not present

## 2023-12-31 DIAGNOSIS — Z87891 Personal history of nicotine dependence: Secondary | ICD-10-CM | POA: Diagnosis not present

## 2023-12-31 DIAGNOSIS — Z1152 Encounter for screening for COVID-19: Secondary | ICD-10-CM | POA: Diagnosis not present

## 2023-12-31 DIAGNOSIS — Z888 Allergy status to other drugs, medicaments and biological substances status: Secondary | ICD-10-CM | POA: Diagnosis not present

## 2023-12-31 DIAGNOSIS — L03114 Cellulitis of left upper limb: Secondary | ICD-10-CM | POA: Diagnosis present

## 2023-12-31 DIAGNOSIS — Z7902 Long term (current) use of antithrombotics/antiplatelets: Secondary | ICD-10-CM | POA: Diagnosis not present

## 2023-12-31 DIAGNOSIS — Z96651 Presence of right artificial knee joint: Secondary | ICD-10-CM | POA: Diagnosis present

## 2023-12-31 DIAGNOSIS — M19012 Primary osteoarthritis, left shoulder: Secondary | ICD-10-CM | POA: Diagnosis present

## 2023-12-31 DIAGNOSIS — Z8249 Family history of ischemic heart disease and other diseases of the circulatory system: Secondary | ICD-10-CM | POA: Diagnosis not present

## 2023-12-31 DIAGNOSIS — M19011 Primary osteoarthritis, right shoulder: Secondary | ICD-10-CM | POA: Diagnosis present

## 2023-12-31 DIAGNOSIS — E7800 Pure hypercholesterolemia, unspecified: Secondary | ICD-10-CM | POA: Diagnosis present

## 2023-12-31 DIAGNOSIS — Z7984 Long term (current) use of oral hypoglycemic drugs: Secondary | ICD-10-CM | POA: Diagnosis not present

## 2023-12-31 DIAGNOSIS — R5381 Other malaise: Secondary | ICD-10-CM | POA: Diagnosis present

## 2023-12-31 DIAGNOSIS — Z885 Allergy status to narcotic agent status: Secondary | ICD-10-CM | POA: Diagnosis not present

## 2023-12-31 LAB — GLUCOSE, CAPILLARY
Glucose-Capillary: 190 mg/dL — ABNORMAL HIGH (ref 70–99)
Glucose-Capillary: 282 mg/dL — ABNORMAL HIGH (ref 70–99)
Glucose-Capillary: 210 mg/dL — ABNORMAL HIGH (ref 70–99)
Glucose-Capillary: 256 mg/dL — ABNORMAL HIGH (ref 70–99)

## 2023-12-31 MED ORDER — INSULIN GLARGINE-YFGN 100 UNIT/ML ~~LOC~~ SOLN
5.0000 [IU] | Freq: Two times a day (BID) | SUBCUTANEOUS | Status: DC
  Administered 2023-12-31 – 2024-01-01 (×3): 5 [IU] via SUBCUTANEOUS
  Filled 2023-12-31 (×4): qty 0.05

## 2023-12-31 MED ORDER — TRAMADOL HCL 50 MG PO TABS
50.0000 mg | ORAL_TABLET | Freq: Two times a day (BID) | ORAL | Status: DC
  Administered 2023-12-31 – 2024-01-01 (×2): 50 mg via ORAL
  Filled 2023-12-31 (×2): qty 1

## 2023-12-31 MED ORDER — INSULIN ASPART 100 UNIT/ML IJ SOLN
0.0000 [IU] | Freq: Three times a day (TID) | INTRAMUSCULAR | Status: DC
  Administered 2023-12-31: 11 [IU] via SUBCUTANEOUS
  Administered 2023-12-31: 4 [IU] via SUBCUTANEOUS
  Administered 2023-12-31 – 2024-01-01 (×3): 7 [IU] via SUBCUTANEOUS
  Administered 2024-01-02: 11 [IU] via SUBCUTANEOUS
  Administered 2024-01-02: 4 [IU] via SUBCUTANEOUS
  Administered 2024-01-02: 3 [IU] via SUBCUTANEOUS

## 2023-12-31 MED ORDER — INSULIN ASPART 100 UNIT/ML IJ SOLN
0.0000 [IU] | Freq: Every day | INTRAMUSCULAR | Status: DC
  Administered 2023-12-31: 3 [IU] via SUBCUTANEOUS

## 2023-12-31 NOTE — Plan of Care (Signed)
 Problem: Education: Goal: Ability to describe self-care measures that may prevent or decrease complications (Diabetes Survival Skills Education) will improve Outcome: Progressing Goal: Individualized Educational Video(s) Outcome: Progressing   Problem: Coping: Goal: Ability to adjust to condition or change in health will improve Outcome: Progressing   Problem: Fluid Volume: Goal: Ability to maintain a balanced intake and output will improve Outcome: Progressing   Problem: Health Behavior/Discharge Planning: Goal: Ability to identify and utilize available resources and services will improve Outcome: Progressing Goal: Ability to manage health-related needs will improve Outcome: Progressing   Problem: Metabolic: Goal: Ability to maintain appropriate glucose levels will improve Outcome: Progressing   Problem: Nutritional: Goal: Maintenance of adequate nutrition will improve Outcome: Progressing Goal: Progress toward achieving an optimal weight will improve Outcome: Progressing   Problem: Skin Integrity: Goal: Risk for impaired skin integrity will decrease Outcome: Progressing   Problem: Tissue Perfusion: Goal: Adequacy of tissue perfusion will improve Outcome: Progressing   Problem: Education: Goal: Knowledge of General Education information will improve Description: Including pain rating scale, medication(s)/side effects and non-pharmacologic comfort measures Outcome: Progressing   Problem: Health Behavior/Discharge Planning: Goal: Ability to manage health-related needs will improve Outcome: Progressing   Problem: Clinical Measurements: Goal: Ability to maintain clinical measurements within normal limits will improve Outcome: Progressing Goal: Will remain free from infection Outcome: Progressing Goal: Diagnostic test results will improve Outcome: Progressing Goal: Respiratory complications will improve Outcome: Progressing Goal: Cardiovascular complication will  be avoided Outcome: Progressing   Problem: Activity: Goal: Risk for activity intolerance will decrease Outcome: Progressing   Problem: Nutrition: Goal: Adequate nutrition will be maintained Outcome: Progressing   Problem: Coping: Goal: Level of anxiety will decrease Outcome: Progressing Problem: Education: Goal: Ability to describe self-care measures that may prevent or decrease complications (Diabetes Survival Skills Education) will improve Outcome: Progressing Goal: Individualized Educational Video(s) Outcome: Progressing   Problem: Coping: Goal: Ability to adjust to condition or change in health will improve Outcome: Progressing   Problem: Fluid Volume: Goal: Ability to maintain a balanced intake and output will improve Outcome: Progressing   Problem: Health Behavior/Discharge Planning: Goal: Ability to identify and utilize available resources and services will improve Outcome: Progressing Goal: Ability to manage health-related needs will improve Outcome: Progressing   Problem: Metabolic: Goal: Ability to maintain appropriate glucose levels will improve Outcome: Progressing   Problem: Nutritional: Goal: Maintenance of adequate nutrition will improve Outcome: Progressing Goal: Progress toward achieving an optimal weight will improve Outcome: Progressing   Problem: Skin Integrity: Goal: Risk for impaired skin integrity will decrease Outcome: Progressing   Problem: Tissue Perfusion: Goal: Adequacy of tissue perfusion will improve Outcome: Progressing   Problem: Education: Goal: Knowledge of General Education information will improve Description: Including pain rating scale, medication(s)/side effects and non-pharmacologic comfort measures Outcome: Progressing   Problem: Health Behavior/Discharge Planning: Goal: Ability to manage health-related needs will improve Outcome: Progressing   Problem: Clinical Measurements: Goal: Ability to maintain clinical  measurements within normal limits will improve Outcome: Progressing Goal: Will remain free from infection Outcome: Progressing Goal: Diagnostic test results will improve Outcome: Progressing Goal: Respiratory complications will improve Outcome: Progressing Goal: Cardiovascular complication will be avoided Outcome: Progressing   Problem: Activity: Goal: Risk for activity intolerance will decrease Outcome: Progressing   Problem: Nutrition: Goal: Adequate nutrition will be maintained Outcome: Progressing   Problem: Coping: Goal: Level of anxiety will decrease Outcome: Progressing   Problem: Elimination: Goal: Will not experience complications related to bowel motility Outcome: Progressing Goal: Will not experience complications  related to urinary retention Outcome: Progressing   Problem: Pain Managment: Goal: General experience of comfort will improve and/or be controlled Outcome: Progressing   Problem: Safety: Goal: Ability to remain free from injury will improve Outcome: Progressing   Problem: Skin Integrity: Goal: Risk for impaired skin integrity will decrease Outcome: Progressing     Problem: Elimination: Goal: Will not experience complications related to bowel motility Outcome: Progressing Goal: Will not experience complications related to urinary retention Outcome: Progressing   Problem: Pain Managment: Goal: General experience of comfort will improve and/or be controlled Outcome: Progressing   Problem: Safety: Goal: Ability to remain free from injury will improve Outcome: Progressing   Problem: Skin Integrity: Goal: Risk for impaired skin integrity will decrease Outcome: Progressing

## 2023-12-31 NOTE — Progress Notes (Signed)
 Physical Therapy Treatment Patient Details Name: Kathleen Oliver MRN: [id number] DOB: 07/23/1945 Today's Date: 12/31/2023   History of Present Illness Kathleen Oliver is a 78 y.o. female being admitted to the hospital with severe left hip pain, in the anterior part of the left hip. Painless PROM, reports having a hard time standing up, negative for evidence of septic joint or any other acute findings such as fracture. Xray b/l shoulders show OA, Xray of L hip/pelvis shows OA; CT of L hip shows OA and mild joint effusion. Of note, recent [place] ED visit for erythema and swelling of L hand  PMH: HTN, hyperlipidemia, non-insulin -dependent diabetes, osteoarthritis, GERD, seizures, hx of stroke, R TKA 2019    PT Comments  Pt making significant improvements this session; amb ~ 350' with RW, CGA to supervision for safety, no LOB with head turns/180 degree turns. Pain not incr with activity. Given progress pt will likely be able to d/c home with HHPT, plan updated.  Will plan to do a trial with rollator next sesison.   If plan is discharge home, recommend the following: A little help with walking and/or transfers;A little help with bathing/dressing/bathroom;Assistance with cooking/housework;Assistance with feeding;Assist for transportation;Help with stairs or ramp for entrance   Can travel by private vehicle        Equipment Recommendations  None recommended by PT;Rollator (4 wheels) (vs RW-- TBD)    Recommendations for Other Services       Precautions / Restrictions Precautions Precautions: Fall Restrictions Weight Bearing Restrictions Per Provider Order: No     Mobility  Bed Mobility Overal bed mobility: Needs Assistance Bed Mobility: Supine to Sit     Supine to sit: Contact guard     General bed mobility comments: for safety    Transfers Overall transfer level: Needs assistance Equipment used: Rolling walker (2 wheels) Transfers: Sit to/from Stand Sit to Stand: Contact guard  assist, Supervision           General transfer comment: Pt CGA for STS, no physical assist required. incr time however no LOB, steady on rising    Ambulation/Gait Ambulation/Gait assistance: Contact guard assist, Supervision Gait Distance (Feet): 350 Feet Assistive device: Rolling walker (2 wheels) Gait Pattern/deviations: Step-through pattern, Decreased stride length Gait velocity: decr but functional     General Gait Details: RLE in external rotation (baseline).  progression to step through pattern, no LOB, CGA with fade to supervision for safety. able to amb ~ few steps without device   Stairs             Wheelchair Mobility     Tilt Bed    Modified Rankin (Stroke Patients Only)       Balance Overall balance assessment: Needs assistance Sitting-balance support: Feet supported, No upper extremity supported Sitting balance-Leahy Scale: Good     Standing balance support: No upper extremity supported, Reliant on assistive device for balance, During functional activity Standing balance-Leahy Scale: Fair                              Hotel manager: No apparent difficulties  Cognition Arousal: Alert Behavior During Therapy: WFL for tasks assessed/performed   PT - Cognitive impairments: No apparent impairments                       PT - Cognition Comments: A&Ox4 Following commands: Intact      Cueing Cueing Techniques: Verbal  cues  Exercises General Exercises - Lower Extremity Ankle Circles/Pumps:  (encouraged ankle pumps and Heel slides, pt able to return demo and verbalize doing ex's on her own)    General Comments        Pertinent Vitals/Pain Pain Assessment Pain Assessment: 0-10 Pain Score: 3  Pain Location: L hip, L shoulder Pain Descriptors / Indicators: Guarding, Tender Pain Intervention(s): Limited activity within patient's tolerance, Monitored during session, Repositioned    Home Living                           Prior Function            PT Goals (current goals can now be found in the care plan section) Acute Rehab PT Goals Patient Stated Goal: To reduce pain PT Goal Formulation: With patient Time For Goal Achievement: 01/13/24 Potential to Achieve Goals: Good Progress towards PT goals: Progressing toward goals    Frequency    Min 2X/week      PT Plan      Co-evaluation              AM-PAC PT 6 Clicks Mobility   Outcome Measure  Help needed turning from your back to your side while in a flat bed without using bedrails?: None Help needed moving from lying on your back to sitting on the side of a flat bed without using bedrails?: A Little Help needed moving to and from a bed to a chair (including a wheelchair)?: A Little Help needed standing up from a chair using your arms (e.g., wheelchair or bedside chair)?: A Little Help needed to walk in hospital room?: A Little Help needed climbing 3-5 steps with a railing? : A Little 6 Click Score: 19    End of Session Equipment Utilized During Treatment: Gait belt Activity Tolerance: Patient tolerated treatment well;No increased pain Patient left: with call bell/phone within reach;in bed;with bed alarm set Nurse Communication: Mobility status PT Visit Diagnosis: Pain;Difficulty in walking, not elsewhere classified (R26.2);Other abnormalities of gait and mobility (R26.89) Pain - Right/Left: Left Pain - part of body: Shoulder;Hip     Time: 8645-8580 PT Time Calculation (min) (ACUTE ONLY): 25 min  Charges:    $Gait Training: 23-37 mins PT General Charges $$ ACUTE PT VISIT: 1 Visit                     Pina Sirianni, PT  Acute Rehab Dept ([facility]) [phone]  12/31/2023    [facility] 12/31/2023, 2:25 PM

## 2023-12-31 NOTE — Plan of Care (Signed)

## 2023-12-31 NOTE — Progress Notes (Signed)
 PROGRESS NOTE    Patient: Kathleen Oliver                            PCP: Dwight Trula SQUIBB, MD                    DOB: 1946/04/06            DOA: 12/29/2023 FMW:[id number]             DOS: 12/31/2023, 9:06 AM   LOS: 0 days   Date of Service: The patient was seen and examined on 12/31/2023  Subjective:   The patient was seen and examined this morning, hemodynamically stable Not complaining of any pain at rest-still limited mobility due to significant hip pain, reporting of improved hand pain and swelling. No issues overnight  Brief Narrative:    Kathleen Oliver is a 78 y.o. female with medical history significant for hypertension, hyperlipidemia, non-insulin -dependent diabetes, osteoarthritis being admitted to the hospital with left hip pain.   Patient states that she lives on her own, has been in her usual state of health until a couple days ago when she noticed swelling and erythema on the dorsum of her left hand.  Says that she often hits the back of her hand on door frames, etc. and sometimes breaks the skin.  In any case she was seen in the ER yesterday was prescribed Keflex  and today the erythema and swelling is significantly improved.   She denies any fevers or chills, however this morning when she tried to get up she had pretty severe left hip pain, in the anterior part of the left hip.  It does not hurt with passive range of motion, but she has a hard time standing up as it feels like her leg is going to give out, and she has muscular pain.  Workup in the emergency department as noted below was negative for evidence of septic joint or any other acute findings such as fracture.  Due to the patient's pain and inability to ambulate, hospitalist observation admission was requested.     Assessment & Plan:   Principal Problem:   Osteoarthritis Active Problems:   GERD (gastroesophageal reflux disease)   Diabetes mellitus without complication (HCC)   Hypertension   Hypercholesteremia    Osteoarthrosis   CAD (coronary artery disease)   Left hip pain   Generalized weakness  Assessment/Plan  Sever Left hip osteoarthritis - Started on IV steroids -and NSAIDs, reporting improved pain at rest -Willing to try more mobility today  -With ambulatory dysfunction -Will try anti-inflammatory parameters -CT of the head was reviewed, negative for any fracture dislocation or septic joint mild effusion  - IV Solu-Medrol  - (in the setting of diabetes anticipating hyperglycemia) - Will continue with as needed analgesics - Continue PT OT    Left hand cellulitis - Much improved erythema edema-improved range of motion -Continue p.o. Keflex    Type 2 diabetes - Hyperglycemic on IV Solu-Medrol  -Long-acting insulin  Semglee  5 mg IV twice daily initiated -Likely CBG q. ACHS, SSI coverage, titrating up -hold metformin ,  - carb modified diet    Severe debility -due to hip pain -PT/OT consulted -SNF versus home with home health  (patient lives alone at home-due to this event patient unable to ambulate or carry ADLs)   hypertension-continue home amlodipine  and losartan    ----------------------------------------------------------------------------------------------------------------------- Nutritional status:  The patient's BMI is: Body mass index is 21.73 kg/m. I agree  with the assessment and plan as outlined below: Nutrition Status:        Skin Assessment: I have examined the patient's skin and I agree with the wound assessment as performed by wound care team  ----------------------------------------------------------------------------------------------------------------------- DVT prophylaxis:  enoxaparin  (LOVENOX ) injection 40 mg Start: 12/29/23 1400   Code Status:   Code Status: Full Code  Family Communication: No family member present at bedside- -Advance care planning has been discussed.   Admission status:   Status is: Observation The patient remains OBS  appropriate and will d/c before 2 midnights.   Disposition: From  - home             Planning for discharge in 2 days: [facility] 10/03/23   Procedures:   No admission procedures for hospital encounter.   Antimicrobials:  Anti-infectives (From admission, onward)    Start     Dose/Rate Route Frequency Ordered Stop   12/29/23 1400  cephALEXin  (KEFLEX ) capsule 500 mg        500 mg Oral 4 times daily 12/29/23 1351     12/29/23 1145  cefTRIAXone  (ROCEPHIN ) 2 g in sodium chloride  0.9 % 100 mL IVPB        2 g 200 mL/hr over 30 Minutes Intravenous  Once 12/29/23 1140 12/29/23 1259        Medication:   amLODipine   10 mg Oral Daily   cephALEXin   500 mg Oral QID   enoxaparin  (LOVENOX ) injection  40 mg Subcutaneous Q24H   gabapentin   100 mg Oral QHS   insulin  aspart  0-20 Units Subcutaneous TID WC   insulin  aspart  0-5 Units Subcutaneous QHS   insulin  glargine-yfgn  5 Units Subcutaneous BID   losartan   100 mg Oral Daily   methylPREDNISolone  (SOLU-MEDROL ) injection  40 mg Intravenous Daily   pantoprazole   20 mg Oral BID   traMADol   50 mg Oral Q12H    acetaminophen  **OR** acetaminophen , albuterol , ondansetron  **OR** ondansetron  (ZOFRAN ) IV, polyethylene glycol, traZODone    Objective:   Vitals:   12/30/23 1314 12/30/23 2059 12/31/23 0529 12/31/23 0904  BP: (!) 142/68 139/85 (!) 146/79 131/76  Pulse: 79 68 68 79  Resp: 19 16 17    Temp: 99.3 F (37.4 C) 98.2 F (36.8 C) 98.1 F (36.7 C)   TempSrc:      SpO2: 97% 93% 95% 97%  Weight:      Height:        Intake/Output Summary (Last 24 hours) at 12/31/2023 0906 Last data filed at 12/30/2023 1314 Gross per 24 hour  Intake 480 ml  Output --  Net 480 ml   Filed Weights   12/29/23 0739  Weight: 52.2 kg     Physical examination:     General:  AAO x 3,  cooperative, no distress;   HEENT:  Normocephalic, PERRL, otherwise with in Normal limits   Neuro:  CNII-XII intact. , normal motor and sensation, reflexes intact   Lungs:    Clear to auscultation BL, Respirations unlabored,  No wheezes / crackles  Cardio:    S1/S2, RRR, No murmure, No Rubs or Gallops   Abdomen:  Soft, non-tender, bowel sounds active all four quadrants, no guarding or peritoneal signs.  Muscular  skeletal:  Limited exam -global generalized weaknesses Limited mobility due to hip pain - in bed, able to move all 4 extremities,   2+ pulses,  symmetric, No pitting edema  Skin:  Dry, warm to touch, negative for any Rashes,  Wounds: Please see nursing documentation  LABs:     Latest Ref Rng & Units 12/30/2023    4:32 AM 12/29/2023    8:11 AM 12/28/2023    6:15 PM  CBC  WBC 4.0 - 10.5 K/uL 5.5  8.3  7.6   Hemoglobin 12.0 - 15.0 g/dL 89.5  89.7  89.1   Hematocrit 36.0 - 46.0 % 30.9  31.3  32.2   Platelets 150 - 400 K/uL 275  219  261       Latest Ref Rng & Units 12/30/2023    4:32 AM 12/29/2023    8:11 AM 12/28/2023    6:15 PM  CMP  Glucose 70 - 99 mg/dL 852  772  779   BUN 8 - 23 mg/dL 13  14  18    Creatinine 0.44 - 1.00 mg/dL 9.48  9.35  9.30   Sodium 135 - 145 mmol/L 137  133  131   Potassium 3.5 - 5.1 mmol/L 3.9  3.7  4.0   Chloride 98 - 111 mmol/L 97  96  94   CO2 22 - 32 mmol/L 29  27  26    Calcium  8.9 - 10.3 mg/dL 8.5  8.4  9.2   Total Protein 6.5 - 8.1 g/dL  6.8    Total Bilirubin 0.0 - 1.2 mg/dL  0.9    Alkaline Phos 38 - 126 U/L  54    AST 15 - 41 U/L  19    ALT 0 - 44 U/L  19         Micro Results Recent Results (from the past 240 hours)  Resp panel by RT-PCR (RSV, Flu A&B, Covid) Anterior Nasal Swab     Status: None   Collection Time: 12/29/23  7:57 AM   Specimen: Anterior Nasal Swab  Result Value Ref Range Status   SARS Coronavirus 2 by RT PCR NEGATIVE NEGATIVE Final    Comment: (NOTE) SARS-CoV-2 target nucleic acids are NOT DETECTED.  The SARS-CoV-2 RNA is generally detectable in upper respiratory specimens during the acute phase of infection. The lowest concentration of SARS-CoV-2 viral copies this  assay can detect is 138 copies/mL. A negative result does not preclude SARS-Cov-2 infection and should not be used as the sole basis for treatment or other patient management decisions. A negative result may occur with  improper specimen collection/handling, submission of specimen other than nasopharyngeal swab, presence of viral mutation(s) within the areas targeted by this assay, and inadequate number of viral copies(<138 copies/mL). A negative result must be combined with clinical observations, patient history, and epidemiological information. The expected result is Negative.  Fact Sheet for Patients:  BloggerCourse.com  Fact Sheet for Healthcare Providers:  SeriousBroker.it  This test is no t yet approved or cleared by the [place]  FDA and  has been authorized for detection and/or diagnosis of SARS-CoV-2 by FDA under an Emergency Use Authorization (EUA). This EUA will remain  in effect (meaning this test can be used) for the duration of the COVID-19 declaration under Section 564(b)(1) of the Act, 21 U.S.C.section 360bbb-3(b)(1), unless the authorization is terminated  or revoked sooner.       Influenza A by PCR NEGATIVE NEGATIVE Final   Influenza B by PCR NEGATIVE NEGATIVE Final    Comment: (NOTE) The Xpert Xpress SARS-CoV-2/FLU/RSV plus assay is intended as an aid in the diagnosis of influenza from Nasopharyngeal swab specimens and should not be used as a sole basis for treatment. Nasal washings and aspirates are unacceptable for Xpert Xpress SARS-CoV-2/FLU/RSV testing.  Fact Sheet for Patients: BloggerCourse.com  Fact Sheet for Healthcare Providers: SeriousBroker.it  This test is not yet approved or cleared by the [place]  FDA and has been authorized for detection and/or diagnosis of SARS-CoV-2 by FDA under an Emergency Use Authorization (EUA). This EUA will  remain in effect (meaning this test can be used) for the duration of the COVID-19 declaration under Section 564(b)(1) of the Act, 21 U.S.C. section 360bbb-3(b)(1), unless the authorization is terminated or revoked.     Resp Syncytial Virus by PCR NEGATIVE NEGATIVE Final    Comment: (NOTE) Fact Sheet for Patients: BloggerCourse.com  Fact Sheet for Healthcare Providers: SeriousBroker.it  This test is not yet approved or cleared by the [place]  FDA and has been authorized for detection and/or diagnosis of SARS-CoV-2 by FDA under an Emergency Use Authorization (EUA). This EUA will remain in effect (meaning this test can be used) for the duration of the COVID-19 declaration under Section 564(b)(1) of the Act, 21 U.S.C. section 360bbb-3(b)(1), unless the authorization is terminated or revoked.  Performed at [facility], [address], [place], [place] [address]   Blood culture (routine x 2)     Status: None (Preliminary result)   Collection Time: 12/29/23  8:10 AM   Specimen: BLOOD LEFT ARM  Result Value Ref Range Status   Specimen Description   Final    BLOOD LEFT ARM Performed at [facility], [address], [place], [place] [address]    Special Requests   Final    BOTTLES DRAWN AEROBIC AND ANAEROBIC Blood Culture adequate volume Performed at [facility], [address], [place], [place] [address]    Culture   Final    NO GROWTH 2 DAYS Performed at [facility], [address] [place], [place] [address]    Report Status PENDING  Incomplete  Blood culture (routine x 2)     Status: None (Preliminary result)   Collection Time: 12/29/23  8:15 AM   Specimen: BLOOD LEFT ARM  Result Value Ref Range Status   Specimen Description   Final    BLOOD LEFT ARM Performed at [facility], [address], [place], [place] [address]    Special Requests   Final    BOTTLES  DRAWN AEROBIC AND ANAEROBIC Blood Culture adequate volume Performed at [facility], [address], [place], [place] [address]    Culture   Final    NO GROWTH 2 DAYS Performed at [facility], [address] [place], [place] [address]    Report Status PENDING  Incomplete    Radiology Reports No results found.   SIGNED: Adriana DELENA Grams, MD, FHM. FAAFP. Jolynn Pack - [facility] Time spent - 55 min.  In seeing, evaluating and examining the patient. Reviewing medical records, labs, drawn plan of care. [facility],  Pager (please use [email or web address] to page/ text) Please use Epic Secure Chat for non-urgent communication (7AM-7PM)  If 7PM-7AM, please contact night-coverage [email or web address], 12/31/2023, 9:06 AM

## 2024-01-01 DIAGNOSIS — M1612 Unilateral primary osteoarthritis, left hip: Secondary | ICD-10-CM | POA: Diagnosis not present

## 2024-01-01 LAB — GLUCOSE, CAPILLARY
Glucose-Capillary: 103 mg/dL — ABNORMAL HIGH (ref 70–99)
Glucose-Capillary: 226 mg/dL — ABNORMAL HIGH (ref 70–99)
Glucose-Capillary: 229 mg/dL — ABNORMAL HIGH (ref 70–99)
Glucose-Capillary: 175 mg/dL — ABNORMAL HIGH (ref 70–99)

## 2024-01-01 MED ORDER — DICLOFENAC EPOLAMINE 1.3 % EX PTCH
1.0000 | MEDICATED_PATCH | Freq: Two times a day (BID) | CUTANEOUS | Status: DC
  Administered 2024-01-01 – 2024-01-02 (×2): 1 via TRANSDERMAL
  Filled 2024-01-01 (×4): qty 1

## 2024-01-01 MED ORDER — TRAZODONE HCL 50 MG PO TABS: 50.0000 mg | ORAL_TABLET | Freq: Every evening | ORAL | Status: DC | PRN

## 2024-01-01 MED ORDER — METHYLPREDNISOLONE SODIUM SUCC 40 MG IJ SOLR
20.0000 mg | Freq: Every day | INTRAMUSCULAR | Status: DC
  Administered 2024-01-01 – 2024-01-02 (×2): 20 mg via INTRAVENOUS
  Filled 2024-01-01 (×2): qty 1

## 2024-01-01 MED ORDER — RISAQUAD PO CAPS
2.0000 | ORAL_CAPSULE | Freq: Three times a day (TID) | ORAL | Status: DC
  Administered 2024-01-01 – 2024-01-02 (×3): 2 via ORAL
  Filled 2024-01-01 (×3): qty 2

## 2024-01-01 MED ORDER — NAPROXEN 250 MG PO TABS
500.0000 mg | ORAL_TABLET | Freq: Two times a day (BID) | ORAL | Status: DC
  Filled 2024-01-01: qty 2

## 2024-01-01 NOTE — Progress Notes (Signed)
 Occupational Therapy Treatment Patient Details Name: Kathleen Oliver MRN: [id number] DOB: Nov 12, 1945 Today's Date: 01/01/2024   History of present illness Kathleen Oliver is a 78 y.o. female being admitted to the hospital with severe left hip pain, in the anterior part of the left hip. Painless PROM, reports having a hard time standing up, negative for evidence of septic joint or any other acute findings such as fracture. Xray b/l shoulders show OA, Xray of L hip/pelvis shows OA; CT of L hip shows OA and mild joint effusion. Of note, recent [place] ED visit for erythema and swelling of L hand  PMH: HTN, hyperlipidemia, non-insulin -dependent diabetes, osteoarthritis, GERD, seizures, hx of stroke, R TKA 2019          If plan is discharge home, recommend the following:  A little help with walking and/or transfers;A little help with bathing/dressing/bathroom;Assistance with cooking/housework   Equipment Recommendations  None recommended by OT       Precautions / Restrictions Precautions Precautions: Fall Restrictions Weight Bearing Restrictions Per Provider Order: No       Mobility Bed Mobility Overal bed mobility: Needs Assistance       Supine to sit: Supervision          Transfers Overall transfer level: Needs assistance Equipment used: Rolling walker (2 wheels) Transfers: Sit to/from Stand Sit to Stand: Supervision Stand pivot transfers: Supervision               Balance Overall balance assessment: Needs assistance Sitting-balance support: Feet supported, No upper extremity supported Sitting balance-Leahy Scale: Good     Standing balance support: No upper extremity supported, Reliant on assistive device for balance, During functional activity Standing balance-Leahy Scale: Fair                             ADL either performed or assessed with clinical judgement   ADL Overall ADL's : Needs assistance/impaired Eating/Feeding: Set up;Sitting    Grooming: Wash/dry hands;Wash/dry face;Oral care;Standing;Supervision/safety   Upper Body Bathing: Sitting;Set up   Lower Body Bathing: Set up;Sit to/from stand   Upper Body Dressing : Sitting;Set up   Lower Body Dressing: Sit to/from stand;Set up   Toilet Transfer: Minimal assistance;BSC/3in1 (hemi walker)   Toileting- Clothing Manipulation and Hygiene: Supervision/safety;Cueing for safety;Sit to/from stand       Functional mobility during ADLs: Minimal assistance (hemi walker on r side)       Vision Baseline Vision/History: 0 No visual deficits           Communication Communication Communication: No apparent difficulties   Cognition Arousal: Alert Behavior During Therapy: WFL for tasks assessed/performed                                 Following commands: Intact        Cueing   Cueing Techniques: Verbal cues  Exercises              Pertinent Vitals/ Pain       Pain Assessment Pain Assessment: No/denies pain         Frequency           Progress Toward Goals  OT Goals(current goals can now be found in the care plan section)  Progress towards OT goals: Progressing toward goals     Plan      AM-PAC OT 6 Clicks Daily Activity  Outcome Measure   Help from another person eating meals?: None Help from another person taking care of personal grooming?: None Help from another person toileting, which includes using toliet, bedpan, or urinal?: A Little Help from another person bathing (including washing, rinsing, drying)?: A Little Help from another person to put on and taking off regular upper body clothing?: None Help from another person to put on and taking off regular lower body clothing?: A Little 6 Click Score: 21    End of Session Equipment Utilized During Treatment: Gait belt;Rolling walker (2 wheels)  OT Visit Diagnosis: Unsteadiness on feet (R26.81);Other abnormalities of gait and mobility (R26.89);Muscle weakness  (generalized) (M62.81);Pain   Activity Tolerance     Patient Left with call bell/phone within reach;in chair   Nurse Communication Mobility status        Time: 8863-8789 OT Time Calculation (min): 34 min  Charges: OT General Charges $OT Visit: 1 Visit OT Treatments $Self Care/Home Management : 23-37 mins   Rollin Kotowski, Norvel BIRCH 01/01/2024, 1:37 PM

## 2024-01-01 NOTE — Progress Notes (Signed)
 PROGRESS NOTE    Patient: Kathleen Oliver                            PCP: Dwight Trula SQUIBB, MD                    DOB: [birth year]            DOA: 12/29/2023 FMW:[id number]             DOS: 01/01/2024, 12:17 PM   LOS: 1 day   Date of Service: The patient was seen and examined on 01/01/2024  Subjective:   The patient was seen and examined this morning, no acute distress, hemodynamically stable. Reporting improved pain in hip and hand, trying to ambulate now with assist.   Brief Narrative:    Kathleen Oliver is a 78 y.o. female with medical history significant for hypertension, hyperlipidemia, non-insulin -dependent diabetes, osteoarthritis being admitted to the hospital with left hip pain.   Patient states that she lives on her own, has been in her usual state of health until a couple days ago when she noticed swelling and erythema on the dorsum of her left hand.  Says that she often hits the back of her hand on door frames, etc. and sometimes breaks the skin.  In any case she was seen in the ER yesterday was prescribed Keflex  and today the erythema and swelling is significantly improved.   She denies any fevers or chills, however this morning when she tried to get up she had pretty severe left hip pain, in the anterior part of the left hip.  It does not hurt with passive range of motion, but she has a hard time standing up as it feels like her leg is going to give out, and she has muscular pain.  Workup in the emergency department as noted below was negative for evidence of septic joint or any other acute findings such as fracture.  Due to the patient's pain and inability to ambulate, hospitalist observation admission was requested.     Assessment & Plan:   Principal Problem:   Osteoarthritis Active Problems:   GERD (gastroesophageal reflux disease)   Diabetes mellitus without complication (HCC)   Hypertension   Hypercholesteremia   Osteoarthrosis   CAD (coronary artery disease)   Left hip  pain   Generalized weakness  Assessment/Plan  Sever Left hip osteoarthritis - Improved pain with current regiment, trying to ambulate  - on IV steroids -and NSAIDs, reporting improved pain at rest - Continue to try ambulate increase mobility today  ambulatory dysfunction -Responding to to steroids -CT of the head was reviewed, negative for any fracture dislocation or septic joint mild effusion  - IV Solu-Medrol  - (in the setting of diabetes anticipating hyperglycemia)-titrating down - Will continue with as needed analgesics - Continue PT OT -recommend SNF versus home with home health    Left hand cellulitis - Much improved erythema edema-improved range of motion -Continue p.o. Keflex    Type 2 diabetes - Hyperglycemic on IV Solu-Medrol  -Long-acting insulin  Semglee  5 mg IV twice daily initiated -Likely CBG q. ACHS, SSI coverage, titrating up -hold metformin ,  - carb modified diet  CBG (last 3)  Recent Labs    12/31/23 1635 12/31/23 1950 01/01/24 0735  GLUCAP 210* 256* 103*     hypertension-continue home amlodipine  and losartan    Severe debility -due to hip pain -PT/OT consulted -SNF versus home with home health  (  patient lives alone at home-due to this event patient unable to ambulate or carry ADLs)     ----------------------------------------------------------------------------------------------------------------------- Nutritional status:  The patient's BMI is: Body mass index is 21.73 kg/m. I agree with the assessment and plan as outlined below: Nutrition Status:        Skin Assessment: I have examined the patient's skin and I agree with the wound assessment as performed by wound care team  ----------------------------------------------------------------------------------------------------------------------- DVT prophylaxis:  enoxaparin  (LOVENOX ) injection 40 mg Start: 12/29/23 1400   Code Status:   Code Status: Full Code  Family Communication: No  family member present at bedside- -Advance care planning has been discussed.   Admission status:   Status is: Observation The patient remains OBS appropriate and will d/c before 2 midnights.   Disposition: From  - home             Planning for discharge in Am.  MONDAY 10/03/23  Either SNF or home with home health  Procedures:   No admission procedures for hospital encounter.   Antimicrobials:  Anti-infectives (From admission, onward)    Start     Dose/Rate Route Frequency Ordered Stop   12/29/23 1400  cephALEXin  (KEFLEX ) capsule 500 mg        500 mg Oral 4 times daily 12/29/23 1351     12/29/23 1145  cefTRIAXone  (ROCEPHIN ) 2 g in sodium chloride  0.9 % 100 mL IVPB        2 g 200 mL/hr over 30 Minutes Intravenous  Once 12/29/23 1140 12/29/23 1259        Medication:   amLODipine   10 mg Oral Daily   cephALEXin   500 mg Oral QID   enoxaparin  (LOVENOX ) injection  40 mg Subcutaneous Q24H   gabapentin   100 mg Oral QHS   insulin  aspart  0-20 Units Subcutaneous TID WC   insulin  aspart  0-5 Units Subcutaneous QHS   insulin  glargine-yfgn  5 Units Subcutaneous BID   losartan   100 mg Oral Daily   methylPREDNISolone  (SOLU-MEDROL ) injection  20 mg Intravenous Daily   pantoprazole   20 mg Oral BID   traMADol   50 mg Oral Q12H    acetaminophen  **OR** acetaminophen , albuterol , ondansetron  **OR** ondansetron  (ZOFRAN ) IV, polyethylene glycol, traZODone    Objective:   Vitals:   12/31/23 0529 12/31/23 0904 12/31/23 1948 01/01/24 0601  BP: (!) 146/79 131/76 136/73 (!) 155/71  Pulse: 68 79 65 (!) 58  Resp: 17  18 18   Temp: 98.1 F (36.7 C)  98.2 F (36.8 C) 97.8 F (36.6 C)  TempSrc:   Oral Oral  SpO2: 95% 97% 96% 98%  Weight:      Height:        Intake/Output Summary (Last 24 hours) at 01/01/2024 1217 Last data filed at 01/01/2024 0803 Gross per 24 hour  Intake 360 ml  Output --  Net 360 ml   Filed Weights   12/29/23 0739  Weight: 52.2 kg     Physical examination:      General:  AAO x 3,  cooperative, no distress;   HEENT:  Normocephalic, PERRL, otherwise with in Normal limits   Neuro:  CNII-XII intact. , normal motor and sensation, reflexes intact   Lungs:   Clear to auscultation BL, Respirations unlabored,  No wheezes / crackles  Cardio:    S1/S2, RRR, No murmure, No Rubs or Gallops   Abdomen:  Soft, non-tender, bowel sounds active all four quadrants, no guarding or peritoneal signs.  Muscular  skeletal:  Limited exam -global  generalized weaknesses - in bed, able to move all 4 extremities,   2+ pulses,  symmetric, No pitting edema  Skin:  Dry, warm to touch, negative for any Rashes,  Wounds: Please see nursing documentation             LABs:     Latest Ref Rng & Units 12/30/2023    4:32 AM 12/29/2023    8:11 AM 12/28/2023    6:15 PM  CBC  WBC 4.0 - 10.5 K/uL 5.5  8.3  7.6   Hemoglobin 12.0 - 15.0 g/dL 89.5  89.7  89.1   Hematocrit 36.0 - 46.0 % 30.9  31.3  32.2   Platelets 150 - 400 K/uL 275  219  261       Latest Ref Rng & Units 12/30/2023    4:32 AM 12/29/2023    8:11 AM 12/28/2023    6:15 PM  CMP  Glucose 70 - 99 mg/dL 852  772  779   BUN 8 - 23 mg/dL 13  14  18    Creatinine 0.44 - 1.00 mg/dL 9.48  9.35  9.30   Sodium 135 - 145 mmol/L 137  133  131   Potassium 3.5 - 5.1 mmol/L 3.9  3.7  4.0   Chloride 98 - 111 mmol/L 97  96  94   CO2 22 - 32 mmol/L 29  27  26    Calcium  8.9 - 10.3 mg/dL 8.5  8.4  9.2   Total Protein 6.5 - 8.1 g/dL  6.8    Total Bilirubin 0.0 - 1.2 mg/dL  0.9    Alkaline Phos 38 - 126 U/L  54    AST 15 - 41 U/L  19    ALT 0 - 44 U/L  19         Micro Results Recent Results (from the past 240 hours)  Resp panel by RT-PCR (RSV, Flu A&B, Covid) Anterior Nasal Swab     Status: None   Collection Time: 12/29/23  7:57 AM   Specimen: Anterior Nasal Swab  Result Value Ref Range Status   SARS Coronavirus 2 by RT PCR NEGATIVE NEGATIVE Final    Comment: (NOTE) SARS-CoV-2 target nucleic acids are NOT DETECTED.  The  SARS-CoV-2 RNA is generally detectable in upper respiratory specimens during the acute phase of infection. The lowest concentration of SARS-CoV-2 viral copies this assay can detect is 138 copies/mL. A negative result does not preclude SARS-Cov-2 infection and should not be used as the sole basis for treatment or other patient management decisions. A negative result may occur with  improper specimen collection/handling, submission of specimen other than nasopharyngeal swab, presence of viral mutation(s) within the areas targeted by this assay, and inadequate number of viral copies(<138 copies/mL). A negative result must be combined with clinical observations, patient history, and epidemiological information. The expected result is Negative.  Fact Sheet for Patients:  BloggerCourse.com  Fact Sheet for Healthcare Providers:  SeriousBroker.it  This test is no t yet approved or cleared by the [place]  FDA and  has been authorized for detection and/or diagnosis of SARS-CoV-2 by FDA under an Emergency Use Authorization (EUA). This EUA will remain  in effect (meaning this test can be used) for the duration of the COVID-19 declaration under Section 564(b)(1) of the Act, 21 U.S.C.section 360bbb-3(b)(1), unless the authorization is terminated  or revoked sooner.       Influenza A by PCR NEGATIVE NEGATIVE Final   Influenza B by PCR NEGATIVE NEGATIVE  Final    Comment: (NOTE) The Xpert Xpress SARS-CoV-2/FLU/RSV plus assay is intended as an aid in the diagnosis of influenza from Nasopharyngeal swab specimens and should not be used as a sole basis for treatment. Nasal washings and aspirates are unacceptable for Xpert Xpress SARS-CoV-2/FLU/RSV testing.  Fact Sheet for Patients: BloggerCourse.com  Fact Sheet for Healthcare Providers: SeriousBroker.it  This test is not yet approved or  cleared by the [place]  FDA and has been authorized for detection and/or diagnosis of SARS-CoV-2 by FDA under an Emergency Use Authorization (EUA). This EUA will remain in effect (meaning this test can be used) for the duration of the COVID-19 declaration under Section 564(b)(1) of the Act, 21 U.S.C. section 360bbb-3(b)(1), unless the authorization is terminated or revoked.     Resp Syncytial Virus by PCR NEGATIVE NEGATIVE Final    Comment: (NOTE) Fact Sheet for Patients: BloggerCourse.com  Fact Sheet for Healthcare Providers: SeriousBroker.it  This test is not yet approved or cleared by the [place]  FDA and has been authorized for detection and/or diagnosis of SARS-CoV-2 by FDA under an Emergency Use Authorization (EUA). This EUA will remain in effect (meaning this test can be used) for the duration of the COVID-19 declaration under Section 564(b)(1) of the Act, 21 U.S.C. section 360bbb-3(b)(1), unless the authorization is terminated or revoked.  Performed at [facility], [address], [place], [place] [address]   Blood culture (routine x 2)     Status: None (Preliminary result)   Collection Time: 12/29/23  8:10 AM   Specimen: BLOOD LEFT ARM  Result Value Ref Range Status   Specimen Description   Final    BLOOD LEFT ARM Performed at [facility], [address], [place], [place] [address]    Special Requests   Final    BOTTLES DRAWN AEROBIC AND ANAEROBIC Blood Culture adequate volume Performed at [facility], [address], [place], [place] [address]    Culture   Final    NO GROWTH 3 DAYS Performed at [facility], [address], [place], [place] [address]    Report Status PENDING  Incomplete  Blood culture (routine x 2)     Status: None (Preliminary result)   Collection Time: 12/29/23  8:15 AM   Specimen: BLOOD LEFT ARM  Result Value Ref Range Status    Specimen Description   Final    BLOOD LEFT ARM Performed at [facility], [address] [place], [place] [address]    Special Requests   Final    BOTTLES DRAWN AEROBIC AND ANAEROBIC Blood Culture adequate volume Performed at [facility], [address] [place], [place] [address]    Culture   Final    NO GROWTH 3 DAYS Performed at [facility], [address] [place], [place] [address]    Report Status PENDING  Incomplete    Radiology Reports No results found.   SIGNED: Adriana DELENA Grams, MD, FHM. FAAFP. Jolynn Pack - [facility] Time spent - 55 min.  In seeing, evaluating and examining the patient. Reviewing medical records, labs, drawn plan of care. [facility],  Pager (please use [email or web address] to page/ text) Please use Epic Secure Chat for non-urgent communication (7AM-7PM)  If 7PM-7AM, please contact night-coverage [email or web address], 01/01/2024, 12:17 PM

## 2024-01-01 NOTE — Progress Notes (Signed)
 Physical Therapy Treatment Patient Details Name: Kathleen Oliver MRN: [id number] DOB: Jul 06, 1945 Today's Date: 01/01/2024   History of Present Illness Kathleen Oliver is a 78 y.o. female being admitted to the hospital with severe left hip pain, in the anterior part of the left hip. Painless PROM, reports having a hard time standing up, negative for evidence of septic joint or any other acute findings such as fracture. Xray b/l shoulders show OA, Xray of L hip/pelvis shows OA; CT of L hip shows OA and mild joint effusion. Of note, recent [place] ED visit for erythema and swelling of L hand  PMH: HTN, hyperlipidemia, non-insulin -dependent diabetes, osteoarthritis, GERD, seizures, hx of stroke, R TKA 2019    PT Comments  Pt  progressing well this session. Gait stability improving with distance, no overt LOB. educated on use and features of rollator, reviewed safety for SPT to rollator. pt demonstrates carryover during session. Continue to rec HHPT     If plan is discharge home, recommend the following: A little help with walking and/or transfers;A little help with bathing/dressing/bathroom;Assistance with cooking/housework;Assistance with feeding;Assist for transportation;Help with stairs or ramp for entrance   Can travel by private vehicle     No  Equipment Recommendations  Rollator (4 wheels)    Recommendations for Other Services       Precautions / Restrictions Precautions Precautions: Fall Restrictions Weight Bearing Restrictions Per Provider Order: No     Mobility  Bed Mobility Overal bed mobility: Modified Independent                  Transfers Overall transfer level: Needs assistance Equipment used: Rolling walker (2 wheels) Transfers: Sit to/from Stand, Bed to chair/wheelchair/BSC Sit to Stand: Supervision   Step pivot transfers: Supervision       General transfer comment: educated on use and features of rollator, reviewed safety for SPT to   rollator. pt  demonstrates carryover during session    Ambulation/Gait Ambulation/Gait assistance: Supervision, Contact guard assist Gait Distance (Feet): 350 Feet Assistive device: Rollator (4 wheels) Gait Pattern/deviations: Step-through pattern, Decreased stride length Gait velocity: decr but functional     General Gait Details: RLE in external rotation (baseline).   step through pattern, unsteady initially wtih stability improving with distance, no overt LOB. CGA with fade to supervision for safety.   Stairs             Wheelchair Mobility     Tilt Bed    Modified Rankin (Stroke Patients Only)       Balance     Sitting balance-Leahy Scale: Good     Standing balance support: No upper extremity supported, Reliant on assistive device for balance, During functional activity Standing balance-Leahy Scale: Fair                              Hotel manager: No apparent difficulties  Cognition Arousal: Alert Behavior During Therapy: WFL for tasks assessed/performed   PT - Cognitive impairments: No apparent impairments                       PT - Cognition Comments: A&Ox4 Following commands: Intact      Cueing Cueing Techniques: Verbal cues  Exercises      General Comments        Pertinent Vitals/Pain Pain Assessment Pain Assessment: No/denies pain    Home Living  Prior Function            PT Goals (current goals can now be found in the care plan section) Acute Rehab PT Goals Patient Stated Goal: To reduce pain PT Goal Formulation: With patient Time For Goal Achievement: 01/13/24 Potential to Achieve Goals: Good Progress towards PT goals: Progressing toward goals    Frequency    Min 2X/week      PT Plan      Co-evaluation              AM-PAC PT 6 Clicks Mobility   Outcome Measure  Help needed turning from your back to your side while in a flat bed  without using bedrails?: None Help needed moving from lying on your back to sitting on the side of a flat bed without using bedrails?: None Help needed moving to and from a bed to a chair (including a wheelchair)?: None Help needed standing up from a chair using your arms (e.g., wheelchair or bedside chair)?: None Help needed to walk in hospital room?: A Little Help needed climbing 3-5 steps with a railing? : A Little 6 Click Score: 22    End of Session         PT Visit Diagnosis: Pain;Difficulty in walking, not elsewhere classified (R26.2);Other abnormalities of gait and mobility (R26.89) Pain - Right/Left: Left Pain - part of body: Shoulder;Hip     Time: 8482-8461 PT Time Calculation (min) (ACUTE ONLY): 21 min  Charges:    $Gait Training: 8-22 mins PT General Charges $$ ACUTE PT VISIT: 1 Visit                     Arvis Miguez, PT  Acute Rehab Dept ([facility]) [phone]  01/01/2024    [facility] 01/01/2024, 4:04 PM

## 2024-01-02 DIAGNOSIS — M1612 Unilateral primary osteoarthritis, left hip: Secondary | ICD-10-CM | POA: Diagnosis not present

## 2024-01-02 LAB — GLUCOSE, CAPILLARY
Glucose-Capillary: 133 mg/dL — ABNORMAL HIGH (ref 70–99)
Glucose-Capillary: 183 mg/dL — ABNORMAL HIGH (ref 70–99)
Glucose-Capillary: 257 mg/dL — ABNORMAL HIGH (ref 70–99)
Glucose-Capillary: 270 mg/dL — ABNORMAL HIGH (ref 70–99)

## 2024-01-02 MED ORDER — AMLODIPINE BESYLATE 10 MG PO TABS: 10.0000 mg | ORAL_TABLET | Freq: Every day | ORAL | 1 refills | Status: DC

## 2024-01-02 MED ORDER — METHOCARBAMOL 500 MG PO TABS: 500.0000 mg | ORAL_TABLET | Freq: Every day | ORAL | 0 refills | Status: AC | PRN

## 2024-01-02 NOTE — TOC Progression Note (Signed)
 Transition of Care [facility]) - Progression Note    Patient Details  Name: Kathleen Oliver MRN: [id number] Date of Birth: [birth year]  Transition of Care [facility]) CM/SW Contact  Doneta Glenys DASEN, RN Phone Number: 01/02/2024, 9:34 AM  Clinical Narrative:    CM spoke with patient about PT recommendation for [facility] PT/OT. Offered choice.Patient would like [facility]. [facility] declined. [facility] accepted for [facility] PT/OT. [employer] for youth walker   Expected Discharge Plan: Home w Home Health Services Barriers to Discharge: Continued Medical Work up  Expected Discharge Plan and Services In-house Referral: NA Discharge Planning Services: CM Consult Post Acute Care Choice: NA Living arrangements for the past 2 months: Single Family Home Expected Discharge Date: 01/02/24               DME Arranged: N/A DME Agency: NA       HH Arranged: NA HH Agency: NA         Social Determinants of Health (SDOH) Interventions SDOH Screenings   Food Insecurity: No Food Insecurity (12/29/2023)  Housing: Low Risk  (12/29/2023)  Transportation Needs: No Transportation Needs (12/29/2023)  Utilities: Not At Risk (12/29/2023)  Social Connections: Moderately Integrated (12/29/2023)  Tobacco Use: Medium Risk (12/29/2023)    Readmission Risk Interventions     No data to display

## 2024-01-02 NOTE — Plan of Care (Signed)

## 2024-01-02 NOTE — Discharge Summary (Signed)
 Physician Discharge Summary   Patient: Kathleen Oliver MRN: [id number] DOB: 14-Sep-1945  Admit date:     12/29/2023  Discharge date: 01/02/24  Discharge Physician: Adriana DELENA Grams   PCP: Dwight Trula SQUIBB, MD   Recommendations at discharge:   Follow-up with PCP in 1 week Continue PT OT, with fall precautions  Discharge Diagnoses: Principal Problem:   Osteoarthritis Active Problems:   Left hip pain   Uncontrolled type 2 diabetes mellitus with hyperglycemia, without long-term current use of insulin  (HCC)   GERD (gastroesophageal reflux disease)   Diabetes mellitus without complication (HCC)   Hypertension   Hypercholesteremia   Osteoarthrosis   CAD (coronary artery disease)   Generalized weakness   Debility  Resolved Problems:   * No resolved hospital problems. Kathleen Oliver Course:  Kathleen Oliver is a 78 y.o. female with medical history significant for hypertension, hyperlipidemia, non-insulin -dependent diabetes, osteoarthritis being admitted to the hospital with left hip pain.   Patient states that she lives on her own, has been in her usual state of health until a couple days ago when she noticed swelling and erythema on the dorsum of her left hand.  Says that she often hits the back of her hand on door frames, etc. and sometimes breaks the skin.  In any case she was seen in the ER yesterday was prescribed Keflex  and today the erythema and swelling is significantly improved.   She denies any fevers or chills, however this morning when she tried to get up she had pretty severe left hip pain, in the anterior part of the left hip.  It does not hurt with passive range of motion, but she has a hard time standing up as it feels like her leg is going to give out, and she has muscular pain.  Workup in the emergency department as noted below was negative for evidence of septic joint or any other acute findings such as fracture.  Due to the patient's pain and inability to ambulate, hospitalist  observation admission was requested.   Sever Left hip osteoarthritis - Improved pain with current regiment, ambulating   - on IV steroids -refused any other NSAIDs due to history of duodenal ulcer, bleeding in the past     ambulatory dysfunction -Responding to to steroids -CT of the head was reviewed, negative for any fracture dislocation or septic joint mild effusion   - IV Solu-Medrol  - (in the setting of diabetes anticipating hyperglycemia)-titrating down-discontinued 01/02/2024 - Will continue with as needed analgesics - Continue PT OT -recommend SNF versus home with home health Patient requested to be discharged home now ambulating-open to home health PT/OT     Left hand cellulitis --Resolved, completed course of antibiotic Keflex  -   Type 2 diabetes - Hyperglycemic on IV Solu-Medrol  -During this hospitalization patient was treated with long-acting insulin  Semglee  5 units, and SSI coverage - Resume metformin ,  - carb modified diet     hypertension-continue home amlodipine  and losartan      Severe debility -due to hip pain -PT/OT consulted -patient requested to be discharged home with home health (Family willing to assist at home for now)         ----------------------------------------------------------------------------------------------------------------------- Nutritional status:  The patient's BMI is: Body mass index is 21.73 kg/m. I agree with the assessment and plan as outlined below: Nutrition Status:    Pain control - [place]  Controlled Substance Reporting System database was reviewed. and patient was instructed, not to drive, operate heavy machinery, perform activities  at heights, swimming or participation in water activities or provide baby-sitting services while on Pain, Sleep and Anxiety Medications; until their outpatient Physician has advised to do so again. Also recommended to not to take more than prescribed Pain, Sleep and Anxiety  Medications.  Consultants: PT/OT, social worker Procedures performed: None Disposition: Home health Diet recommendation:  Discharge Diet Orders (From admission, onward)     Start     Ordered   01/02/24 0000  Diet - low sodium heart healthy        01/02/24 0906   01/02/24 0000  Diet Carb Modified        01/02/24 0906           Carb modified diet DISCHARGE MEDICATION: Allergies as of 01/02/2024       Reactions   Tizanidine Palpitations   Heart flutter   Atorvastatin Other (See Comments)   Unknown    Buspirone Hcl Other (See Comments)   Unknown    Colesevelam Other (See Comments)   Abdominal pain    Nsaids Other (See Comments)   Bleeding. Hx of bleeding and was told to never take NSAIDS.    Cinoxacin Other (See Comments)   hives   Codeine Itching   Penicillins Hives        Medication List     STOP taking these medications    cephALEXin  500 MG capsule Commonly known as: KEFLEX        TAKE these medications    amLODipine  10 MG tablet Commonly known as: NORVASC  Take 1 tablet (10 mg total) by mouth daily. Start taking on: January 03, 2024 What changed:  medication strength how much to take   Anucort-HC 25 MG suppository Generic drug: hydrocortisone Place 25 mg rectally daily as needed for hemorrhoids or anal itching.   clopidogrel  75 MG tablet Commonly known as: PLAVIX  Take 75 mg by mouth at bedtime.   gabapentin  100 MG capsule Commonly known as: NEURONTIN  Take 100 mg by mouth at bedtime.   GAS-X PO Take 1 capsule by mouth daily as needed (abdominal pain).   latanoprost  0.005 % ophthalmic solution Commonly known as: XALATAN  Place 1 drop into both eyes at bedtime.   lidocaine  4 % Apply 1 patch topically daily as needed (pain).   loperamide  1 MG/5ML solution Commonly known as: IMODIUM  Take 1 mg by mouth as needed for diarrhea or loose stools.   losartan  100 MG tablet Commonly known as: COZAAR  Take 100 mg by mouth daily.   metFORMIN  500 MG  tablet Commonly known as: GLUCOPHAGE  Take 500 mg by mouth in the morning and at bedtime.   methocarbamol  500 MG tablet Commonly known as: Robaxin  Take 1 tablet (500 mg total) by mouth daily as needed for up to 20 days for muscle spasms.   metoprolol  succinate 25 MG 24 hr tablet Commonly known as: Toprol  XL Take 0.5 tablets (12.5 mg total) by mouth daily.   pantoprazole  20 MG tablet Commonly known as: PROTONIX  Take 20 mg by mouth 2 (two) times daily.   Repatha  140 MG/ML Sosy Generic drug: Evolocumab  Inject 140 mg into the skin every 14 (fourteen) days.   saccharomyces boulardii 250 MG capsule Commonly known as: FLORASTOR Take 250 mg by mouth daily.   sucralfate 1 g tablet Commonly known as: CARAFATE Take 1 g by mouth daily.   Systane 0.4-0.3 % Gel ophthalmic gel Generic drug: Polyethyl Glycol-Propyl Glycol Place 1 application into both eyes at bedtime. What changed:  when to take this reasons to  take this   traMADol  50 MG tablet Commonly known as: ULTRAM  Take 50 mg by mouth every 6 (six) hours as needed (Back pain.).   VITAMIN D  PO Take 2,500 mcg by mouth daily.               Durable Medical Equipment  (From admission, onward)           Start     Ordered   01/01/24 1610  For home use only DME Walker rolling  Once       Question Answer Comment  Walker: Other   Comments rollator   Patient needs a walker to treat with the following condition Difficulty in walking, not elsewhere classified      01/01/24 1609            Discharge Exam: Filed Weights   12/29/23 0739  Weight: 52.2 kg        General:  AAO x 3,  cooperative, no distress;   HEENT:  Normocephalic, PERRL, otherwise with in Normal limits   Neuro:  CNII-XII intact. , normal motor and sensation, reflexes intact   Lungs:   Clear to auscultation BL, Respirations unlabored,  No wheezes / crackles  Cardio:    S1/S2, RRR, No murmure, No Rubs or Gallops   Abdomen:  Soft, non-tender,  bowel sounds active all four quadrants, no guarding or peritoneal signs.  Muscular  skeletal:  Limited exam -global generalized weaknesses - in bed, able to move all 4 extremities,   2+ pulses,  symmetric, No pitting edema  Skin:  Dry, warm to touch, negative for any Rashes,  Wounds: Please see nursing documentation          Condition at discharge: fair  The results of significant diagnostics from this hospitalization (including imaging, microbiology, ancillary and laboratory) are listed below for reference.   Imaging Studies: CT Hip Left Wo Contrast Result Date: 12/29/2023 CLINICAL DATA:  Left hip pain. EXAM: CT OF THE LEFT HIP WITHOUT CONTRAST TECHNIQUE: Multidetector CT imaging of the left hip was performed according to the standard protocol. Multiplanar CT image reconstructions were also generated. RADIATION DOSE REDUCTION: This exam was performed according to the departmental dose-optimization program which includes automated exposure control, adjustment of the mA and/or kV according to patient size and/or use of iterative reconstruction technique. COMPARISON:  Similar radiographs of the left hip dated 12/29/2023. FINDINGS: Bones/Joint/Cartilage No acute fracture or dislocation. Mild joint space narrowing of the left hip with moderate superolateral acetabular osteophytosis. Small left hip joint effusion. The left sacroiliac joint and pubic symphysis are intact with mild degenerative changes. Mild degenerative disc height loss and endplate osteophytosis at L5-S1. Ligaments Ligaments are suboptimally evaluated by CT. Muscles and Tendons Muscles are normal. No muscle atrophy. No intramuscular fluid collection or hematoma. Soft tissue No fluid collection or hematoma. Partially visualized ventral abdominal wall hernia containing mildly distended fluid and gas-filled loops of small bowel and colon. Sigmoid colonic diverticulosis. IMPRESSION: 1. No acute osseous abnormality. 2. Mild-to-moderate  osteoarthritis of the left hip. 3. Small left hip joint effusion. 4. Partially visualized ventral abdominal wall hernia containing mildly distended fluid and gas-filled loops of small bowel and colon. Electronically Signed   By: Harrietta Sherry M.D.   On: 12/29/2023 12:44   DG Chest Portable 1 View Result Date: 12/29/2023 CLINICAL DATA:  Evaluate for pneumonia. Left hand cellulitis. Pain. Weakness. EXAM: PORTABLE CHEST 1 VIEW COMPARISON:  CT chest 10/05/2021 FINDINGS: Cardiac silhouette and mediastinal contours are within normal limits.  Mild upper lung linear likely scarring. No acute airspace opacity. No pleural effusion or pneumothorax. Moderate multilevel degenerative disc changes of the thoracic spine. Cholecystectomy clips. IMPRESSION: No active disease. Electronically Signed   By: Tanda Lyons M.D.   On: 12/29/2023 09:05   DG Hip Unilat W or Wo Pelvis 2-3 Views Left Result Date: 12/29/2023 CLINICAL DATA:  Sudden onset of bilateral leg pain today. Cannot lift legs Foley. Cannot stand by self. EXAM: DG HIP (WITH OR WITHOUT PELVIS) 2-3V LEFT COMPARISON:  CT abdomen pelvis 02/07/2023 FINDINGS: Mild bilateral femoroacetabular joint space narrowing. Moderate bilateral superolateral acetabular degenerative osteophytes. Mild pubic symphysis joint space narrowing and peripheral osteophytosis. Mild lateral recess inferior sacroiliac subchondral sclerosis. No acute fracture is seen, with attention to the left hip. Hernia mesh coils are noted overlying the pelvis. IMPRESSION: Mild-to-moderate bilateral femoroacetabular osteoarthritis. Electronically Signed   By: Tanda Lyons M.D.   On: 12/29/2023 09:03   DG Shoulder Left Result Date: 12/29/2023 CLINICAL DATA:  Left shoulder pain. Left hand cellulitis. Hyperglycemia. EXAM: LEFT SHOULDER - 2+ VIEW COMPARISON:  None Available. FINDINGS: There is diffuse decreased bone mineralization. Mild to moderate glenohumeral joint space narrowing and peripheral osteophytosis.  Mild acromioclavicular joint space narrowing and peripheral osteophytosis. No acute fracture is seen. No dislocation. IMPRESSION: Mild-to-moderate glenohumeral and mild acromioclavicular osteoarthritis. Electronically Signed   By: Tanda Lyons M.D.   On: 12/29/2023 09:00   DG Hand Complete Left Result Date: 12/28/2023 CLINICAL DATA:  Redness and swelling. EXAM: LEFT HAND - COMPLETE 3+ VIEW COMPARISON:  None Available. FINDINGS: There is no evidence of fracture or dislocation. Osteoarthritis of the second and third metacarpal phalangeal joints as well as thumb carpal metacarpal joint with joint space narrowing and spurring. There is mild osteoarthritis throughout the digits. No erosive or bony destructive change. No focal soft tissue abnormalities. No soft tissue gas or radiopaque foreign body. IMPRESSION: 1. No acute osseous abnormality. 2. Osteoarthritis of the second and third metacarpophalangeal joints as well as thumb carpometacarpal joint. Mild osteoarthritis throughout the digits. Electronically Signed   By: Andrea Gasman M.D.   On: 12/28/2023 19:15    Microbiology: Results for orders placed or performed during the hospital encounter of 12/29/23  Resp panel by RT-PCR (RSV, Flu A&B, Covid) Anterior Nasal Swab     Status: None   Collection Time: 12/29/23  7:57 AM   Specimen: Anterior Nasal Swab  Result Value Ref Range Status   SARS Coronavirus 2 by RT PCR NEGATIVE NEGATIVE Final    Comment: (NOTE) SARS-CoV-2 target nucleic acids are NOT DETECTED.  The SARS-CoV-2 RNA is generally detectable in upper respiratory specimens during the acute phase of infection. The lowest concentration of SARS-CoV-2 viral copies this assay can detect is 138 copies/mL. A negative result does not preclude SARS-Cov-2 infection and should not be used as the sole basis for treatment or other patient management decisions. A negative result may occur with  improper specimen collection/handling, submission of  specimen other than nasopharyngeal swab, presence of viral mutation(s) within the areas targeted by this assay, and inadequate number of viral copies(<138 copies/mL). A negative result must be combined with clinical observations, patient history, and epidemiological information. The expected result is Negative.  Fact Sheet for Patients:  BloggerCourse.com  Fact Sheet for Healthcare Providers:  SeriousBroker.it  This test is no t yet approved or cleared by the [place]  FDA and  has been authorized for detection and/or diagnosis of SARS-CoV-2 by FDA under an Emergency Use Authorization (EUA).  This EUA will remain  in effect (meaning this test can be used) for the duration of the COVID-19 declaration under Section 564(b)(1) of the Act, 21 U.S.C.section 360bbb-3(b)(1), unless the authorization is terminated  or revoked sooner.       Influenza A by PCR NEGATIVE NEGATIVE Final   Influenza B by PCR NEGATIVE NEGATIVE Final    Comment: (NOTE) The Xpert Xpress SARS-CoV-2/FLU/RSV plus assay is intended as an aid in the diagnosis of influenza from Nasopharyngeal swab specimens and should not be used as a sole basis for treatment. Nasal washings and aspirates are unacceptable for Xpert Xpress SARS-CoV-2/FLU/RSV testing.  Fact Sheet for Patients: BloggerCourse.com  Fact Sheet for Healthcare Providers: SeriousBroker.it  This test is not yet approved or cleared by the [place]  FDA and has been authorized for detection and/or diagnosis of SARS-CoV-2 by FDA under an Emergency Use Authorization (EUA). This EUA will remain in effect (meaning this test can be used) for the duration of the COVID-19 declaration under Section 564(b)(1) of the Act, 21 U.S.C. section 360bbb-3(b)(1), unless the authorization is terminated or revoked.     Resp Syncytial Virus by PCR NEGATIVE NEGATIVE Final     Comment: (NOTE) Fact Sheet for Patients: BloggerCourse.com  Fact Sheet for Healthcare Providers: SeriousBroker.it  This test is not yet approved or cleared by the [place]  FDA and has been authorized for detection and/or diagnosis of SARS-CoV-2 by FDA under an Emergency Use Authorization (EUA). This EUA will remain in effect (meaning this test can be used) for the duration of the COVID-19 declaration under Section 564(b)(1) of the Act, 21 U.S.C. section 360bbb-3(b)(1), unless the authorization is terminated or revoked.  Performed at [facility], [address] [place], [place] [address]   Blood culture (routine x 2)     Status: None (Preliminary result)   Collection Time: 12/29/23  8:10 AM   Specimen: BLOOD LEFT ARM  Result Value Ref Range Status   Specimen Description   Final    BLOOD LEFT ARM Performed at [facility], [address], [place], [place] [address]    Special Requests   Final    BOTTLES DRAWN AEROBIC AND ANAEROBIC Blood Culture adequate volume Performed at [facility], [address] [place], [place] [address]    Culture   Final    NO GROWTH 4 DAYS Performed at [facility], [address], [place], [place] [address]    Report Status PENDING  Incomplete  Blood culture (routine x 2)     Status: None (Preliminary result)   Collection Time: 12/29/23  8:15 AM   Specimen: BLOOD LEFT ARM  Result Value Ref Range Status   Specimen Description   Final    BLOOD LEFT ARM Performed at [facility], [address], [place], [place] [address]    Special Requests   Final    BOTTLES DRAWN AEROBIC AND ANAEROBIC Blood Culture adequate volume Performed at [facility], [address] [place], [place] [address]    Culture   Final    NO GROWTH 4 DAYS Performed at [facility], [address] [place], [place] [address]    Report  Status PENDING  Incomplete    Labs: CBC: Recent Labs  Lab 12/28/23 1815 12/29/23 0811 12/30/23 0432  WBC 7.6 8.3 5.5  NEUTROABS 5.7 6.8  --   HGB 10.8* 10.2* 10.4*  HCT 32.2* 31.3* 30.9*  MCV 92.0 93.4 93.6  PLT 261 219 275   Basic Metabolic Panel: Recent Labs  Lab 12/28/23 1815 12/29/23 0811 12/30/23 0432  NA 131* 133* 137  K 4.0 3.7 3.9  CL 94* 96* 97*  CO2 26 27 29   GLUCOSE 220* 227* 147*  BUN 18 14 13   CREATININE 0.69 0.64 0.51  CALCIUM  9.2 8.4* 8.5*  MG  --  1.5*  --    Liver Function Tests: Recent Labs  Lab 12/29/23 0811  AST 19  ALT 19  ALKPHOS 54  BILITOT 0.9  PROT 6.8  ALBUMIN 2.8*   CBG: Recent Labs  Lab 01/01/24 0735 01/01/24 1216 01/01/24 1634 01/01/24 2035 01/02/24 0732  GLUCAP 103* 226* 229* 175* 133*    Discharge time spent: greater than 30 minutes.  Signed: Adriana DELENA Grams, MD [facility] 01/02/2024

## 2024-01-02 NOTE — TOC Transition Note (Signed)
 Transition of Care [facility]) - Discharge Note   Patient Details  Name: Kathleen Oliver MRN: [id number] Date of Birth: 08-02-1945  [facility] CM/SW Contact:  Doneta Glenys DASEN, RN Phone Number: 01/02/2024, 11:37 AM   Clinical Narrative:    Patient discharged with [facility] PT/OT and [employer] will supple youth walker to patients room.  TOC sign off   Final next level of care: Home w Home Health Services Barriers to Discharge: Barriers Resolved   Patient Goals and CMS Choice Patient states their goals for this hospitalization and ongoing recovery are:: Home with [facility] if strong enough CMS Medicare.gov Compare Post Acute Care list provided to:: Patient Choice offered to / list presented to : Patient  ownership interest in [facility].provided to:: Parent NA    Discharge Placement                       Discharge Plan and Services Additional resources added to the After Visit Summary for   In-house Referral: NA Discharge Planning Services: CM Consult Post Acute Care Choice: NA          DME Arranged: Walker rolling DME Agency: [employer] Date DME Agency Contacted: 01/02/24 Time DME Agency Contacted: 1040 Representative spoke with at DME Agency: [place] HH Arranged: PT, OT HH Agency: [facility] Date [facility] Agency Contacted: 01/02/24 Time HH Agency Contacted: 1030 Representative spoke with at [facility] Agency: [place]  Social Drivers of Health (SDOH) Interventions SDOH Screenings   Food Insecurity: No Food Insecurity (12/29/2023)  Housing: Low Risk  (12/29/2023)  Transportation Needs: No Transportation Needs (12/29/2023)  Utilities: Not At Risk (12/29/2023)  Social Connections: Moderately Integrated (12/29/2023)  Tobacco Use: Medium Risk (12/29/2023)     Readmission Risk Interventions     No data to display

## 2024-01-02 NOTE — Plan of Care (Signed)

## 2024-01-03 DIAGNOSIS — I10 Essential (primary) hypertension: Secondary | ICD-10-CM | POA: Diagnosis not present

## 2024-01-03 DIAGNOSIS — M1612 Unilateral primary osteoarthritis, left hip: Secondary | ICD-10-CM | POA: Diagnosis not present

## 2024-01-03 DIAGNOSIS — K219 Gastro-esophageal reflux disease without esophagitis: Secondary | ICD-10-CM | POA: Diagnosis not present

## 2024-01-03 DIAGNOSIS — E7800 Pure hypercholesterolemia, unspecified: Secondary | ICD-10-CM | POA: Diagnosis not present

## 2024-01-03 DIAGNOSIS — Z602 Problems related to living alone: Secondary | ICD-10-CM | POA: Diagnosis not present

## 2024-01-03 DIAGNOSIS — Z7984 Long term (current) use of oral hypoglycemic drugs: Secondary | ICD-10-CM | POA: Diagnosis not present

## 2024-01-03 DIAGNOSIS — I2510 Atherosclerotic heart disease of native coronary artery without angina pectoris: Secondary | ICD-10-CM | POA: Diagnosis not present

## 2024-01-03 DIAGNOSIS — Z7902 Long term (current) use of antithrombotics/antiplatelets: Secondary | ICD-10-CM | POA: Diagnosis not present

## 2024-01-03 DIAGNOSIS — E1165 Type 2 diabetes mellitus with hyperglycemia: Secondary | ICD-10-CM | POA: Diagnosis not present

## 2024-01-03 LAB — CULTURE, BLOOD (ROUTINE X 2)
Culture: NO GROWTH
Culture: NO GROWTH
Special Requests: ADEQUATE
Special Requests: ADEQUATE

## 2024-01-05 DIAGNOSIS — M130 Polyarthritis, unspecified: Secondary | ICD-10-CM | POA: Diagnosis not present

## 2024-01-06 ENCOUNTER — Other Ambulatory Visit: Payer: Self-pay | Admitting: Physician Assistant

## 2024-01-09 DIAGNOSIS — Z602 Problems related to living alone: Secondary | ICD-10-CM | POA: Diagnosis not present

## 2024-01-09 DIAGNOSIS — Z7902 Long term (current) use of antithrombotics/antiplatelets: Secondary | ICD-10-CM | POA: Diagnosis not present

## 2024-01-09 DIAGNOSIS — E1165 Type 2 diabetes mellitus with hyperglycemia: Secondary | ICD-10-CM | POA: Diagnosis not present

## 2024-01-09 DIAGNOSIS — Z7984 Long term (current) use of oral hypoglycemic drugs: Secondary | ICD-10-CM | POA: Diagnosis not present

## 2024-01-09 DIAGNOSIS — K219 Gastro-esophageal reflux disease without esophagitis: Secondary | ICD-10-CM | POA: Diagnosis not present

## 2024-01-09 DIAGNOSIS — I10 Essential (primary) hypertension: Secondary | ICD-10-CM | POA: Diagnosis not present

## 2024-01-09 DIAGNOSIS — I2510 Atherosclerotic heart disease of native coronary artery without angina pectoris: Secondary | ICD-10-CM | POA: Diagnosis not present

## 2024-01-09 DIAGNOSIS — E7800 Pure hypercholesterolemia, unspecified: Secondary | ICD-10-CM | POA: Diagnosis not present

## 2024-01-09 DIAGNOSIS — M1612 Unilateral primary osteoarthritis, left hip: Secondary | ICD-10-CM | POA: Diagnosis not present

## 2024-01-16 DIAGNOSIS — M25432 Effusion, left wrist: Secondary | ICD-10-CM | POA: Diagnosis not present

## 2024-01-17 ENCOUNTER — Encounter (HOSPITAL_COMMUNITY): Payer: Self-pay

## 2024-01-17 ENCOUNTER — Encounter (HOSPITAL_COMMUNITY)

## 2024-01-26 DIAGNOSIS — E1140 Type 2 diabetes mellitus with diabetic neuropathy, unspecified: Secondary | ICD-10-CM | POA: Diagnosis not present

## 2024-01-26 DIAGNOSIS — D649 Anemia, unspecified: Secondary | ICD-10-CM | POA: Diagnosis not present

## 2024-01-26 DIAGNOSIS — M1990 Unspecified osteoarthritis, unspecified site: Secondary | ICD-10-CM | POA: Diagnosis not present

## 2024-01-26 DIAGNOSIS — M25432 Effusion, left wrist: Secondary | ICD-10-CM | POA: Diagnosis not present

## 2024-01-26 DIAGNOSIS — E46 Unspecified protein-calorie malnutrition: Secondary | ICD-10-CM | POA: Diagnosis not present

## 2024-02-10 ENCOUNTER — Ambulatory Visit (HOSPITAL_COMMUNITY)
Admission: RE | Admit: 2024-02-10 | Discharge: 2024-02-10 | Disposition: A | Discharge status: Home / Self Care | Source: Ambulatory Visit | Attending: Physician Assistant | Admitting: Physician Assistant

## 2024-02-10 DIAGNOSIS — I493 Ventricular premature depolarization: Secondary | ICD-10-CM | POA: Insufficient documentation

## 2024-02-10 DIAGNOSIS — I359 Nonrheumatic aortic valve disorder, unspecified: Secondary | ICD-10-CM

## 2024-02-10 DIAGNOSIS — I700 Atherosclerosis of aorta: Secondary | ICD-10-CM | POA: Insufficient documentation

## 2024-02-10 DIAGNOSIS — I351 Nonrheumatic aortic (valve) insufficiency: Secondary | ICD-10-CM | POA: Diagnosis not present

## 2024-02-10 DIAGNOSIS — I517 Cardiomegaly: Secondary | ICD-10-CM | POA: Insufficient documentation

## 2024-02-10 DIAGNOSIS — I779 Disorder of arteries and arterioles, unspecified: Secondary | ICD-10-CM | POA: Diagnosis not present

## 2024-02-10 DIAGNOSIS — I2510 Atherosclerotic heart disease of native coronary artery without angina pectoris: Secondary | ICD-10-CM | POA: Diagnosis not present

## 2024-02-10 DIAGNOSIS — I10 Essential (primary) hypertension: Secondary | ICD-10-CM | POA: Diagnosis not present

## 2024-02-10 LAB — ECHOCARDIOGRAM COMPLETE
Area-P 1/2: 2.91 cm2
S' Lateral: 2.35 cm

## 2024-02-11 ENCOUNTER — Ambulatory Visit: Payer: Self-pay | Admitting: Physician Assistant

## 2024-02-15 DIAGNOSIS — M353 Polymyalgia rheumatica: Secondary | ICD-10-CM | POA: Diagnosis not present

## 2024-02-15 DIAGNOSIS — M0609 Rheumatoid arthritis without rheumatoid factor, multiple sites: Secondary | ICD-10-CM | POA: Diagnosis not present

## 2024-02-15 DIAGNOSIS — M2560 Stiffness of unspecified joint, not elsewhere classified: Secondary | ICD-10-CM | POA: Diagnosis not present

## 2024-02-15 DIAGNOSIS — M79642 Pain in left hand: Secondary | ICD-10-CM | POA: Diagnosis not present

## 2024-02-15 DIAGNOSIS — Z6821 Body mass index (BMI) 21.0-21.9, adult: Secondary | ICD-10-CM | POA: Diagnosis not present

## 2024-02-15 DIAGNOSIS — M79641 Pain in right hand: Secondary | ICD-10-CM | POA: Diagnosis not present

## 2024-02-29 DIAGNOSIS — R197 Diarrhea, unspecified: Secondary | ICD-10-CM | POA: Diagnosis not present

## 2024-03-08 ENCOUNTER — Encounter: Payer: Self-pay | Admitting: Podiatry

## 2024-03-08 ENCOUNTER — Ambulatory Visit: Admitting: Podiatry

## 2024-03-08 VITALS — Ht 61.0 in | Wt 115.0 lb

## 2024-03-08 DIAGNOSIS — B351 Tinea unguium: Secondary | ICD-10-CM | POA: Diagnosis not present

## 2024-03-08 DIAGNOSIS — M79675 Pain in left toe(s): Secondary | ICD-10-CM | POA: Diagnosis not present

## 2024-03-08 DIAGNOSIS — E119 Type 2 diabetes mellitus without complications: Secondary | ICD-10-CM

## 2024-03-08 DIAGNOSIS — M79674 Pain in right toe(s): Secondary | ICD-10-CM | POA: Diagnosis not present

## 2024-03-08 NOTE — Progress Notes (Signed)

## 2024-03-12 ENCOUNTER — Other Ambulatory Visit (HOSPITAL_COMMUNITY): Payer: Self-pay | Admitting: Internal Medicine

## 2024-03-12 DIAGNOSIS — R29898 Other symptoms and signs involving the musculoskeletal system: Secondary | ICD-10-CM

## 2024-04-03 NOTE — Progress Notes (Unsigned)
 Cardiology Office Note    Date:  04/04/2024  ID:  Shawnell, Dykes 11/03/1945, MRN [id number] PCP:  Dwight Trula SQUIBB, MD  Cardiologist:  Candyce Reek, MD / APP: Raphael Bring PA-C Electrophysiologist:  None   Chief Complaint: f/u PVCs, atherosclerosis  History of Present Illness: .    Kathleen Oliver is a 78 y.o. female with visit-pertinent history of bronchial asthma, stroke, PVCs, moderate AI, LVH,  aortic atherosclerosis/coronary atherosclerosis, pulmonary nodules followed by PCP, GERD, hiatal hernia, HTN, HLD followed by PCP (on PCSK9i), obesity, DM, mild carotid artery disease (1-39% BICA 01/2023) who is seen for follow-up.    She was previously seen for shortness of breath and palpitations the latter felt due to PVCs. Remote cath 2011 showed no significant CAD. Cor CT 08/2019 showed CAC 0, no evidence of CAD, + aortic atherosclerosis, 2mm pulmonary nodules nonspecific but statistically likely benign. SOB was ultimately felt multifactorial due to deconditioning. Her PCP followed her lung nodules with CT 09/2021 showing no new nodules.; this did show coronary calcification. I saw her in 2024 for an uptick in palpitations similar to when she had PVCs in the past. She thought this coincided with when her neighbors began smoking marijuana. Repeat echo 12/2022 showed EF 60-65%, severe asymmetric left ventricular hypertrophy of the basal-septal segment, trivial MR, moderate AI. Zio showed rare PACs/PVCs, range 43bpm (sleeping hours) to 143bpm, average 70bpm. I offered trial of low dose Toprol . She had issues with diarrhea she originally thought was due to metoprolol  but this persisted even after cessation. Due to family history of SCD, we pursued cMRI last OV showing normal EF 69%, mild LVH, visually moderate AI but only trivial by flow calculation, borderline elevated ECV, not suggestive of HCM - more likely senile septum variant - I reviewed with Dr. Santo who agreed this was a benign finding.   F/u echo to trend AI 01/2024 showed EF 60-65%, G1DD, mod LVH of BSS, mild AI.   She has had issues with significant weight loss over the last few years as well as chronic diarrhea. She has been seeing PCP and GI. She was in the hospital in July with severe hip arthritis and hand cellulitis. She reports she has since established with rheumatologist who diagnosed her with rheumatoid arthritis. She is now on prednisone taper and hydroxychloroquine which has helped the pain. She does note continued weight loss and generalized fatigue. No chest pain, dyspnea, palpitations, edema, syncope.    Labwork independently reviewed: 01/2024 KPN Cr 0.80, Hgb 11.9, ALT wnl 12/2023 Hgb 10.4, plt ok, K 3.9, Cr 0.51, Na 133, Cr 0.64, alb 2.8, LFTs wnl 05/2023 TSH OK Lipids managed by PCP 03/2023 trig 116, LDL 80, HDL 76  ROS: .    Please see the history of present illness.  All other systems are reviewed and otherwise negative.  Studies Reviewed: SABRA    EKG:  EKG is ordered today, personally reviewed, demonstrating:  EKG Interpretation Date/Time:  Wednesday April 04 2024 10:14:16 EDT Ventricular Rate:  58 PR Interval:  176 QRS Duration:  100 QT Interval:  410 QTC Calculation: 402 R Axis:   45  Text Interpretation: Sinus bradycardia with Premature atrial complexes Nonspecific ST upsloping inferiorly, as previously seen Confirmed by Adalyna Godbee [phone]) on 04/04/2024 10:27:06 AM    CV Studies: Cardiac studies reviewed are outlined and summarized above. Otherwise please see EMR for full report.   Current Reported Medications:.    Current Meds  Medication Sig   amLODipine  (  NORVASC ) 2.5 MG tablet Take 7.5 mg by mouth daily.   ANUCORT-HC 25 MG suppository Place 25 mg rectally daily as needed for hemorrhoids or anal itching.   cholestyramine (QUESTRAN) 4 g packet 4 g daily at 6 (six) AM.   clopidogrel  (PLAVIX ) 75 MG tablet Take 75 mg by mouth at bedtime.    Evolocumab  (REPATHA ) 140 MG/ML SOSY Inject 140  mg into the skin every 14 (fourteen) days.   gabapentin  (NEURONTIN ) 100 MG capsule Take 100 mg by mouth at bedtime.   glimepiride  (AMARYL ) 1 MG tablet Take 1 mg by mouth every morning.   hydroxychloroquine (PLAQUENIL) 200 MG tablet daily.   latanoprost  (XALATAN ) 0.005 % ophthalmic solution Place 1 drop into both eyes at bedtime.    Lidocaine  4 % PTCH Apply 1 patch topically daily as needed (pain).   loperamide  (IMODIUM ) 1 MG/5ML solution Take 1 mg by mouth as needed for diarrhea or loose stools.   losartan  (COZAAR ) 100 MG tablet Take 100 mg by mouth daily.   metFORMIN  (GLUCOPHAGE ) 500 MG tablet Take 500 mg by mouth in the morning and at bedtime.   metoprolol  succinate (TOPROL -XL) 25 MG 24 hr tablet Take 1/2 (one-half) tablet by mouth once daily   pantoprazole  (PROTONIX ) 20 MG tablet Take 20 mg by mouth 2 (two) times daily.   Polyethyl Glycol-Propyl Glycol (SYSTANE) 0.4-0.3 % GEL ophthalmic gel Place 1 application into both eyes at bedtime. (Patient taking differently: Place 1 Application into both eyes as needed (dry eyes).)   predniSONE (DELTASONE) 1 MG tablet Take 3 mg by mouth daily. Takes with one 5mg  tablet. Slowly tapering down.   predniSONE (DELTASONE) 5 MG tablet Take 5 mg by mouth daily. Takes with three 1mg  tablets daily. Slowly tapering down.   saccharomyces boulardii (FLORASTOR) 250 MG capsule Take 250 mg by mouth daily.   Simethicone  (GAS-X PO) Take 1 capsule by mouth daily as needed (abdominal pain).   sucralfate (CARAFATE) 1 g tablet Take 1 g by mouth daily.   traMADol  (ULTRAM ) 50 MG tablet Take 50 mg by mouth every 6 (six) hours as needed (Back pain.).    VITAMIN D  PO Take 2,500 mcg by mouth daily.   [DISCONTINUED] predniSONE (DELTASONE) 5 MG tablet 8 mg daily at 6 (six) AM.    Physical Exam:    VS:  BP (!) 150/80   Pulse (!) 58   Ht 5' 1 (1.549 m)   Wt 110 lb 9.6 oz (50.2 kg)   SpO2 96%   BMI 20.90 kg/m    Wt Readings from Last 3 Encounters:  04/04/24 110 lb 9.6 oz  (50.2 kg)  03/08/24 115 lb (52.2 kg)  12/29/23 115 lb (52.2 kg)    GEN: Well nourished, well developed in no acute distress NECK: No JVD; No carotid bruits CARDIAC: RRR, no murmurs, rubs, gallops RESPIRATORY:  Clear to auscultation without rales, wheezing or rhonchi  ABDOMEN: Soft, non-tender, non-distended EXTREMITIES:  No edema; No acute deformity   Asessement and Plan:.    1. PVCs - quiescent by exam and EKG. Continue low dose metoprolol , dose appropriate given baseline sinus bradycardia. She reports she is having labs tomorrow for her physical. She had a low magnesium  level in the hospital in July. I am not sure if primary care has since rechecked this. She is not on any cardiac meds that would cause this. I have asked her to ask her PCP to follow magnesium  on her labwork. She is also now on HCQ. EKG shows  stable QTc.  2. Coronary/aortic atherosclerosis - no accelerating anginal symptoms. She is on Plavix  prescribed by outside office presumably for history of stroke, not presently on ASA. Lipids are managed by PCP.  3. LVH with HTN - cMRI reviewed above, felt to be normal variant for age. She reports amlodipine  was decreased to 7.5mg  daily about 6 months ago for lower BPs. This was before she was on prednisone which may be contributing. She is seeing readings mid 120s-140s at home. Today she is at 150/80 by recheck with me. Will increase amlodipine  back to 10mg  daily. She is continuing to follow with PCP in the context of ongoing medical management of various issues (weight loss, diarrhea, RA, nodule on thigh, leg weakness) therefore I recommended she continue to follow with primary care for additional management of blood pressure since otherwise does not need cardiology follow-up for another 6 months.  4. Aortic insufficiency - mild by echo 01/2024, consider recheck in 2028.  5. Mild carotid artery disease - lipids managed by PCP. No acute f/u needed. Can consider carotid duplex around  01/2025 if indicated.    Disposition: F/u with me in 6 months. Offered MD follow-up (would suggest Dr. Santo given prior discussion with him re: cMRI). She prefers to continue to follow with me but is open to seeing MD in the future if additional needs arise.   Signed, Britteney Ayotte N Cathren Sween, PA-C

## 2024-04-04 ENCOUNTER — Encounter: Payer: Self-pay | Admitting: Physician Assistant

## 2024-04-04 ENCOUNTER — Ambulatory Visit: Attending: Cardiology | Admitting: Physician Assistant

## 2024-04-04 VITALS — BP 150/80 | HR 58 | Ht 61.0 in | Wt 110.6 lb

## 2024-04-04 DIAGNOSIS — I2510 Atherosclerotic heart disease of native coronary artery without angina pectoris: Secondary | ICD-10-CM | POA: Diagnosis not present

## 2024-04-04 DIAGNOSIS — I351 Nonrheumatic aortic (valve) insufficiency: Secondary | ICD-10-CM

## 2024-04-04 DIAGNOSIS — I493 Ventricular premature depolarization: Secondary | ICD-10-CM | POA: Diagnosis not present

## 2024-04-04 DIAGNOSIS — I10 Essential (primary) hypertension: Secondary | ICD-10-CM

## 2024-04-04 DIAGNOSIS — I517 Cardiomegaly: Secondary | ICD-10-CM

## 2024-04-04 MED ORDER — AMLODIPINE BESYLATE 10 MG PO TABS
10.0000 mg | ORAL_TABLET | Freq: Every day | ORAL | 3 refills | Status: AC
Start: 2024-04-04 — End: 2024-07-03

## 2024-04-04 NOTE — Patient Instructions (Addendum)
    Medication Instructions:   Increase Amlodipine   10 mg   Stop 2.5 mg Amlodipine  *If you need a refill on your cardiac medications before your next appointment, please call your pharmacy*   Lab Work:  Please show this to  Your primary Dr Ellery suggest getting a magnesium  level with her labs they'll be drawing for her physical (out of [facility] system so we aren't formally ordering)-   If you have labs (blood work) drawn today and your tests are completely normal, you will receive your results only by: MyChart Message (if you have MyChart) OR A paper copy in the mail If you have any lab test that is abnormal or we need to change your treatment, we will call you to review the results.   Testing/Procedures:    Follow-Up: At [facility], you and your health needs are our priority.  As part of our continuing mission to provide you with exceptional heart care, we have created designated Provider Care Teams.  These Care Teams include your primary Cardiologist (physician) and Advanced Practice Providers (APPs -  Physician Assistants and Nurse Practitioners) who all work together to provide you with the care you need, when you need it.     Your next appointment:   6 month(s)  The format for your next appointment:   In Person  Provider:   Dayna Dunn, PA-C          Other Instructions

## 2024-04-05 DIAGNOSIS — Z23 Encounter for immunization: Secondary | ICD-10-CM | POA: Diagnosis not present

## 2024-04-05 DIAGNOSIS — I10 Essential (primary) hypertension: Secondary | ICD-10-CM | POA: Diagnosis not present

## 2024-04-05 DIAGNOSIS — E1142 Type 2 diabetes mellitus with diabetic polyneuropathy: Secondary | ICD-10-CM | POA: Diagnosis not present

## 2024-04-05 DIAGNOSIS — R197 Diarrhea, unspecified: Secondary | ICD-10-CM | POA: Diagnosis not present

## 2024-04-05 DIAGNOSIS — Z780 Asymptomatic menopausal state: Secondary | ICD-10-CM | POA: Diagnosis not present

## 2024-04-05 DIAGNOSIS — M1990 Unspecified osteoarthritis, unspecified site: Secondary | ICD-10-CM | POA: Diagnosis not present

## 2024-04-05 DIAGNOSIS — E7800 Pure hypercholesterolemia, unspecified: Secondary | ICD-10-CM | POA: Diagnosis not present

## 2024-04-05 DIAGNOSIS — E1140 Type 2 diabetes mellitus with diabetic neuropathy, unspecified: Secondary | ICD-10-CM | POA: Diagnosis not present

## 2024-04-05 DIAGNOSIS — Z79899 Other long term (current) drug therapy: Secondary | ICD-10-CM | POA: Diagnosis not present

## 2024-04-05 DIAGNOSIS — E559 Vitamin D deficiency, unspecified: Secondary | ICD-10-CM | POA: Diagnosis not present

## 2024-04-05 DIAGNOSIS — Z1331 Encounter for screening for depression: Secondary | ICD-10-CM | POA: Diagnosis not present

## 2024-04-05 DIAGNOSIS — K219 Gastro-esophageal reflux disease without esophagitis: Secondary | ICD-10-CM | POA: Diagnosis not present

## 2024-04-05 DIAGNOSIS — Z0000 Encounter for general adult medical examination without abnormal findings: Secondary | ICD-10-CM | POA: Diagnosis not present

## 2024-04-07 ENCOUNTER — Other Ambulatory Visit (HOSPITAL_BASED_OUTPATIENT_CLINIC_OR_DEPARTMENT_OTHER): Payer: Self-pay | Admitting: Internal Medicine

## 2024-04-07 DIAGNOSIS — Z780 Asymptomatic menopausal state: Secondary | ICD-10-CM

## 2024-04-17 DIAGNOSIS — H02834 Dermatochalasis of left upper eyelid: Secondary | ICD-10-CM | POA: Diagnosis not present

## 2024-04-17 DIAGNOSIS — H02831 Dermatochalasis of right upper eyelid: Secondary | ICD-10-CM | POA: Diagnosis not present

## 2024-04-17 DIAGNOSIS — H00024 Hordeolum internum left upper eyelid: Secondary | ICD-10-CM | POA: Diagnosis not present

## 2024-04-17 DIAGNOSIS — E119 Type 2 diabetes mellitus without complications: Secondary | ICD-10-CM | POA: Diagnosis not present

## 2024-04-19 DIAGNOSIS — H00016 Hordeolum externum left eye, unspecified eyelid: Secondary | ICD-10-CM | POA: Diagnosis not present

## 2024-04-19 DIAGNOSIS — R21 Rash and other nonspecific skin eruption: Secondary | ICD-10-CM | POA: Diagnosis not present

## 2024-04-25 ENCOUNTER — Other Ambulatory Visit: Payer: Self-pay | Admitting: Interventional Cardiology

## 2024-04-26 DIAGNOSIS — Z6820 Body mass index (BMI) 20.0-20.9, adult: Secondary | ICD-10-CM | POA: Diagnosis not present

## 2024-04-26 DIAGNOSIS — E559 Vitamin D deficiency, unspecified: Secondary | ICD-10-CM | POA: Diagnosis not present

## 2024-04-26 DIAGNOSIS — M79642 Pain in left hand: Secondary | ICD-10-CM | POA: Diagnosis not present

## 2024-04-26 DIAGNOSIS — M79641 Pain in right hand: Secondary | ICD-10-CM | POA: Diagnosis not present

## 2024-04-26 DIAGNOSIS — M2560 Stiffness of unspecified joint, not elsewhere classified: Secondary | ICD-10-CM | POA: Diagnosis not present

## 2024-04-26 DIAGNOSIS — M0609 Rheumatoid arthritis without rheumatoid factor, multiple sites: Secondary | ICD-10-CM | POA: Diagnosis not present

## 2024-04-26 DIAGNOSIS — M353 Polymyalgia rheumatica: Secondary | ICD-10-CM | POA: Diagnosis not present

## 2024-04-27 ENCOUNTER — Other Ambulatory Visit (HOSPITAL_COMMUNITY): Payer: Self-pay | Admitting: Internal Medicine

## 2024-04-27 DIAGNOSIS — R29898 Other symptoms and signs involving the musculoskeletal system: Secondary | ICD-10-CM

## 2024-05-07 ENCOUNTER — Ambulatory Visit (HOSPITAL_COMMUNITY)
Admission: RE | Admit: 2024-05-07 | Discharge: 2024-05-07 | Disposition: A | Discharge status: Home / Self Care | Source: Ambulatory Visit | Attending: Surgery | Admitting: Surgery

## 2024-05-07 DIAGNOSIS — R29898 Other symptoms and signs involving the musculoskeletal system: Secondary | ICD-10-CM

## 2024-05-07 DIAGNOSIS — R531 Weakness: Secondary | ICD-10-CM

## 2024-05-07 LAB — VAS US ABI WITH/WO TBI
Left ABI: 1.25
Right ABI: 1.28

## 2024-05-28 ENCOUNTER — Other Ambulatory Visit: Payer: Self-pay | Admitting: Internal Medicine

## 2024-05-28 DIAGNOSIS — M51361 Other intervertebral disc degeneration, lumbar region with lower extremity pain only: Secondary | ICD-10-CM

## 2024-06-01 ENCOUNTER — Other Ambulatory Visit

## 2024-06-07 ENCOUNTER — Encounter: Payer: Self-pay | Admitting: Podiatry

## 2024-06-07 ENCOUNTER — Ambulatory Visit: Admitting: Podiatry

## 2024-06-07 DIAGNOSIS — M79675 Pain in left toe(s): Secondary | ICD-10-CM | POA: Diagnosis not present

## 2024-06-07 DIAGNOSIS — E119 Type 2 diabetes mellitus without complications: Secondary | ICD-10-CM

## 2024-06-07 DIAGNOSIS — B351 Tinea unguium: Secondary | ICD-10-CM | POA: Diagnosis not present

## 2024-06-07 DIAGNOSIS — M79674 Pain in right toe(s): Secondary | ICD-10-CM

## 2024-06-07 NOTE — Progress Notes (Signed)

## 2024-06-26 ENCOUNTER — Ambulatory Visit
Admission: RE | Admit: 2024-06-26 | Discharge: 2024-06-26 | Disposition: A | Discharge status: Home / Self Care | Source: Ambulatory Visit | Attending: Internal Medicine | Admitting: Internal Medicine

## 2024-06-26 DIAGNOSIS — M51361 Other intervertebral disc degeneration, lumbar region with lower extremity pain only: Secondary | ICD-10-CM

## 2024-07-05 ENCOUNTER — Other Ambulatory Visit: Payer: Self-pay | Admitting: Interventional Cardiology

## 2024-08-03 NOTE — Progress Notes (Unsigned)
 "  Referring Physician:  Dwight Trula SQUIBB, MD [address] [place],  [place] [address]  Primary Physician:  Dwight Trula SQUIBB, MD  History of Present Illness: 08/03/2024*** [name] has a history of ***  Left side low back pain that radiates to the left hip and groin and down left leg.    Duration: 2 months Location: *** Quality: *** Severity: ***  Precipitating: aggravated by *** Modifying factors: made better by *** Weakness: none Timing: ***  Tobacco use: smokes *** PPD x years. Does not smoke.   Bowel/Bladder Dysfunction: none  Conservative measures:  Physical therapy: *** has not participated in  Multimodal medical therapy including regular antiinflammatories: *** Prednisone, Lidocaine  patch, Gabapentin , Tramadol  Injections: no epidural steroid injections  Past Surgery: ***none  Kathleen Oliver has ***no symptoms of cervical myelopathy.  The symptoms are causing a significant impact on the patient's life.   Review of Systems:  A 10 point review of systems is negative, except for the pertinent positives and negatives detailed in the HPI.  Past Medical History: Past Medical History:  Diagnosis Date   Aortic insufficiency    Bronchial asthma    Chronic bronchitis (HCC)    numerous years; not q yr    Complication of anesthesia 1999   had 3 seizures & ended up in ICU days after I'd had anesthesia    Diverticulosis of colon    Dysrhythmia    PVC's   GERD (gastroesophageal reflux disease)    History of hiatal hernia    HTN (hypertension)    Hypercholesteremia    Hypertension    LVH (left ventricular hypertrophy)    Obesity    Osteoarthrosis    Pneumonia 2000's X 1   Premature atrial contraction    PVC's (premature ventricular contractions)    Recurrent UTI    Seizures (HCC) 1999 X 3   days after anesthesia- no further issues   Stroke (HCC)    /MRI from 1999;; showed I'd had had several small ones-no residual effects.   Type II  diabetes mellitus (HCC)     Past Surgical History: Past Surgical History:  Procedure Laterality Date   ABDOMINAL HERNIA REPAIR  2001 X 2   APPENDECTOMY  ~ 2000   BIOPSY THYROID      BLADDER SUSPENSION  2000's   CARDIAC CATHETERIZATION  1990's? ; 11/2009   CARPAL TUNNEL RELEASE Bilateral ~ 2003   CATARACT EXTRACTION W/ INTRAOCULAR LENS  IMPLANT, BILATERAL Bilateral ~ 2012   CHOLECYSTECTOMY OPEN  1984   COLECTOMY  ~ 2000   perforated colon due to diverticulitis   COLONOSCOPY  04/22/2021   COLONOSCOPY WITH PROPOFOL  N/A 02/18/2015   Procedure: COLONOSCOPY WITH PROPOFOL ;  Surgeon: Gladis MARLA Louder, MD;  Location: [facility];  Service: Endoscopy;  Laterality: N/A;   COLOSTOMY  ~ 2000   COLOSTOMY REVERSAL  ~ 2000   only needed it 9 months   ESOPHAGEAL DILATION  2000's X 1   HERNIA REPAIR     SHOULDER ARTHROSCOPY W/ ROTATOR CUFF REPAIR Right    x2 -scope '07   TONSILLECTOMY AND ADENOIDECTOMY  1950's   TOTAL ABDOMINAL HYSTERECTOMY  2004   TOTAL KNEE ARTHROPLASTY Right 02/24/2018   TOTAL KNEE ARTHROPLASTY Right 02/24/2018   Procedure: RIGHT TOTAL KNEE ARTHROPLASTY;  Surgeon: Kay Kemps, MD;  Location: [facility] OR;  Service: Orthopedics;  Laterality: Right;   TRIGGER FINGER RELEASE Right 10/2009   ring finger   TRIGGER FINGER RELEASE Right 04/2014  index & pinky   UPPER GI ENDOSCOPY  04/22/2021    Allergies: Allergies as of 08/14/2024 - Review Complete 06/07/2024  Allergen Reaction Noted   Tizanidine Palpitations 05/01/2015   Atorvastatin Other (See Comments) 06/16/2022   Buspirone hcl Other (See Comments) 12/29/2023   Colesevelam Other (See Comments) 06/16/2022   Nsaids Other (See Comments) 01/01/2024   Cinoxacin Other (See Comments) 04/29/2012   Codeine Itching 08/22/2014   Penicillins Hives 04/29/2012    Medications: Outpatient Encounter Medications as of 08/14/2024  Medication Sig   amLODipine  (NORVASC ) 10 MG tablet Take 1 tablet (10 mg total) by mouth daily.    ANUCORT-HC 25 MG suppository Place 25 mg rectally daily as needed for hemorrhoids or anal itching.   cholestyramine (QUESTRAN) 4 g packet 4 g daily at 6 (six) AM.   clopidogrel  (PLAVIX ) 75 MG tablet Take 75 mg by mouth at bedtime.    Evolocumab  (REPATHA ) 140 MG/ML SOSY Inject 140 mg into the skin every 14 (fourteen) days.   gabapentin  (NEURONTIN ) 100 MG capsule Take 100 mg by mouth at bedtime.   glimepiride  (AMARYL ) 1 MG tablet Take 1 mg by mouth every morning.   hydroxychloroquine (PLAQUENIL) 200 MG tablet daily.   latanoprost  (XALATAN ) 0.005 % ophthalmic solution Place 1 drop into both eyes at bedtime.    Lidocaine  4 % PTCH Apply 1 patch topically daily as needed (pain).   loperamide  (IMODIUM ) 1 MG/5ML solution Take 1 mg by mouth as needed for diarrhea or loose stools.   losartan  (COZAAR ) 100 MG tablet Take 100 mg by mouth daily.   metFORMIN  (GLUCOPHAGE ) 500 MG tablet Take 500 mg by mouth in the morning and at bedtime.   metoprolol  succinate (TOPROL -XL) 25 MG 24 hr tablet Take 1/2 (one-half) tablet by mouth once daily   pantoprazole  (PROTONIX ) 20 MG tablet Take 20 mg by mouth 2 (two) times daily.   Polyethyl Glycol-Propyl Glycol (SYSTANE) 0.4-0.3 % GEL ophthalmic gel Place 1 application into both eyes at bedtime. (Patient taking differently: Place 1 Application into both eyes as needed (dry eyes).)   predniSONE (DELTASONE) 1 MG tablet Take 3 mg by mouth daily. Takes with one 5mg  tablet. Slowly tapering down.   predniSONE (DELTASONE) 5 MG tablet Take 5 mg by mouth daily. Takes with three 1mg  tablets daily. Slowly tapering down.   saccharomyces boulardii (FLORASTOR) 250 MG capsule Take 250 mg by mouth daily.   Simethicone  (GAS-X PO) Take 1 capsule by mouth daily as needed (abdominal pain).   sucralfate (CARAFATE) 1 g tablet Take 1 g by mouth daily.   traMADol  (ULTRAM ) 50 MG tablet Take 50 mg by mouth every 6 (six) hours as needed (Back pain.).    VITAMIN D  PO Take 2,500 mcg by mouth daily.    No facility-administered encounter medications on file as of 08/14/2024.    Social History: Social History[1]  Family Medical History: Family History  Problem Relation Age of Onset   Diverticulitis Mother    Hypertension Mother    Heart attack Father    Hypertension Sister    Heart attack Brother    Diabetes Brother    Arthritis Daughter     Physical Examination: There were no vitals filed for this visit.  General: Patient is well developed, well nourished, calm, collected, and in no apparent distress. Attention to examination is appropriate.  Respiratory: Patient is breathing without any difficulty.   NEUROLOGICAL:     Awake, alert, oriented to person, place, and time.  Speech is clear and  fluent. Fund of knowledge is appropriate.   Cranial Nerves: Pupils equal round and reactive to light.  Facial tone is symmetric.    *** ROM of cervical spine *** pain *** posterior cervical tenderness. *** tenderness in bilateral trapezial region.   *** ROM of lumbar spine *** pain *** posterior lumbar tenderness.   No abnormal lesions on exposed skin.   Strength: Side Biceps Triceps Deltoid Interossei Grip Wrist Ext. Wrist Flex.  R 5 5 5 5 5 5 5   L 5 5 5 5 5 5 5    Side Iliopsoas Quads Hamstring PF DF EHL  R 5 5 5 5 5 5   L 5 5 5 5 5 5    Reflexes are ***2+ and symmetric at the biceps, brachioradialis, patella and achilles.   Hoffman's is absent.  Clonus is not present.   Bilateral upper and lower extremity sensation is intact to light touch.     Gait is normal.   ***No difficulty with tandem gait.    Medical Decision Making  Imaging: ***  I have personally reviewed the images and agree with the above interpretation.  Assessment and Plan: Ms. Verrilli is a pleasant 80 y.o. female has ***  Treatment options discussed with patient and following plan made:   - Order for physical therapy for *** spine ***. Patient to call to schedule appointment. *** - Continue current  medications including ***. Reviewed dosing and side effects.  - Prescription for ***. Reviewed dosing and side effects. Take with food.  - Prescription for *** to take prn muscle spasms. Reviewed dosing and side effects. Discussed this can cause drowsiness.  - MRI of *** to further evaluate *** radiculopathy. No improvement time or medications (***).  - Referral to PMR at [facility] to discuss possible *** injections.  - Will schedule phone visit to review MRI results once I get them back.   I spent a total of *** minutes in face-to-face and non-face-to-face activities related to this patient's care today including review of outside records, review of imaging, review of symptoms, physical exam, discussion of differential diagnosis, discussion of treatment options, and documentation.   Thank you for involving me in the care of this patient.   Glade Boys PA-C Dept. of Neurosurgery     [1]  Social History Tobacco Use   Smoking status: Former    Current packs/day: 0.00    Average packs/day: 2.0 packs/day for 20.0 years (40.0 ttl pk-yrs)    Types: Cigarettes    Start date: 02/15/1962    Quit date: 02/15/1982    Years since quitting: 42.4   Smokeless tobacco: Never  Substance Use Topics   Alcohol use: Yes    Comment: stopped drinking [id number]   Drug use: No   "

## 2024-08-14 ENCOUNTER — Ambulatory Visit: Admitting: Orthopedic Surgery

## 2024-09-05 ENCOUNTER — Ambulatory Visit: Admitting: Podiatry

## 2024-09-13 ENCOUNTER — Ambulatory Visit (HOSPITAL_BASED_OUTPATIENT_CLINIC_OR_DEPARTMENT_OTHER)
